# Patient Record
Sex: Female | Born: 1960 | Race: White | Hispanic: No | Marital: Married | Smoking: Former smoker
Health system: Southern US, Community
[De-identification: ages and names within clinical notes are randomized; demographics above are authoritative.]

## PROBLEM LIST (undated history)

## (undated) DIAGNOSIS — R351 Nocturia: Secondary | ICD-10-CM

## (undated) DIAGNOSIS — Z860100 Personal history of colon polyps, unspecified: Secondary | ICD-10-CM

## (undated) DIAGNOSIS — F419 Anxiety disorder, unspecified: Secondary | ICD-10-CM

## (undated) DIAGNOSIS — E785 Hyperlipidemia, unspecified: Secondary | ICD-10-CM

## (undated) DIAGNOSIS — Z973 Presence of spectacles and contact lenses: Secondary | ICD-10-CM

## (undated) DIAGNOSIS — I10 Essential (primary) hypertension: Secondary | ICD-10-CM

## (undated) DIAGNOSIS — R3915 Urgency of urination: Secondary | ICD-10-CM

## (undated) DIAGNOSIS — J302 Other seasonal allergic rhinitis: Secondary | ICD-10-CM

## (undated) DIAGNOSIS — E039 Hypothyroidism, unspecified: Secondary | ICD-10-CM

## (undated) DIAGNOSIS — N819 Female genital prolapse, unspecified: Secondary | ICD-10-CM

## (undated) DIAGNOSIS — Z862 Personal history of diseases of the blood and blood-forming organs and certain disorders involving the immune mechanism: Secondary | ICD-10-CM

## (undated) DIAGNOSIS — G629 Polyneuropathy, unspecified: Secondary | ICD-10-CM

## (undated) DIAGNOSIS — E119 Type 2 diabetes mellitus without complications: Secondary | ICD-10-CM

## (undated) HISTORY — DX: Hyperlipidemia, unspecified: E78.5

## (undated) HISTORY — DX: Polyneuropathy, unspecified: G62.9

## (undated) HISTORY — DX: Anxiety disorder, unspecified: F41.9

## (undated) HISTORY — DX: Type 2 diabetes mellitus without complications: E11.9

## (undated) HISTORY — DX: Essential (primary) hypertension: I10

---

## 1999-08-19 HISTORY — PX: ABDOMINAL HYSTERECTOMY: SHX81

## 2004-12-18 ENCOUNTER — Ambulatory Visit: Payer: Self-pay | Admitting: General Surgery

## 2004-12-24 ENCOUNTER — Ambulatory Visit: Payer: Self-pay | Admitting: General Surgery

## 2006-08-18 HISTORY — PX: LAPAROSCOPIC CHOLECYSTECTOMY: SUR755

## 2008-03-29 ENCOUNTER — Ambulatory Visit: Payer: Self-pay | Admitting: Internal Medicine

## 2009-01-17 ENCOUNTER — Ambulatory Visit: Payer: Self-pay | Admitting: Specialist

## 2011-03-26 LAB — LIPID PANEL: Triglycerides: 539 mg/dL — AB (ref 40–160)

## 2011-03-31 ENCOUNTER — Encounter: Payer: Self-pay | Admitting: Family Medicine

## 2011-03-31 ENCOUNTER — Ambulatory Visit (INDEPENDENT_AMBULATORY_CARE_PROVIDER_SITE_OTHER): Payer: BC Managed Care – PPO | Admitting: Family Medicine

## 2011-03-31 VITALS — BP 130/84 | HR 92 | Temp 98.3°F | Ht 67.0 in | Wt 275.5 lb

## 2011-03-31 DIAGNOSIS — Z1231 Encounter for screening mammogram for malignant neoplasm of breast: Secondary | ICD-10-CM

## 2011-03-31 DIAGNOSIS — E785 Hyperlipidemia, unspecified: Secondary | ICD-10-CM

## 2011-03-31 DIAGNOSIS — F411 Generalized anxiety disorder: Secondary | ICD-10-CM

## 2011-03-31 DIAGNOSIS — F419 Anxiety disorder, unspecified: Secondary | ICD-10-CM

## 2011-03-31 DIAGNOSIS — E669 Obesity, unspecified: Secondary | ICD-10-CM

## 2011-03-31 DIAGNOSIS — E559 Vitamin D deficiency, unspecified: Secondary | ICD-10-CM | POA: Insufficient documentation

## 2011-03-31 DIAGNOSIS — Z0000 Encounter for general adult medical examination without abnormal findings: Secondary | ICD-10-CM

## 2011-03-31 MED ORDER — FLUOXETINE HCL 10 MG PO TABS: 10.0000 mg | ORAL_TABLET | Freq: Every day | ORAL | Status: DC

## 2011-03-31 MED ORDER — SIMVASTATIN 20 MG PO TABS: 20.0000 mg | ORAL_TABLET | Freq: Every day | ORAL | Status: DC

## 2011-03-31 MED ORDER — GEMFIBROZIL 600 MG PO TABS: 600.0000 mg | ORAL_TABLET | Freq: Every day | ORAL | Status: DC

## 2011-03-31 MED ORDER — ALPRAZOLAM 0.25 MG PO TABS: 0.2500 mg | ORAL_TABLET | Freq: Three times a day (TID) | ORAL | Status: AC | PRN

## 2011-03-31 MED ORDER — SUMATRIPTAN 20 MG/ACT NA SOLN: 1.0000 | NASAL | Status: DC | PRN

## 2011-03-31 MED ORDER — ESTRADIOL 2 MG PO TABS: 2.0000 mg | ORAL_TABLET | Freq: Every day | ORAL | Status: DC

## 2011-03-31 NOTE — Progress Notes (Signed)
Subjective:    Patient ID: [name], female    DOB: 10-20-1960, 50 y.o.   MRN: [id number]  HPI  50 yo here to establish care and for CPX. G3P3, s/p hysterectomy.  Had pap smear done at OBGYN last year. On estrace for hot flashes and insomnia which have greatly improved.  Hyperlipidemia- TG very high. Bring in labs with her today. Ran out of her Gemfibrozil months ago. Was not having myalgias with combo of simvastatin 20 mg and Gemfibrozil.  Lab Results  Component Value Date   HDL 28* 03/26/2011   Lab Results  Component Value Date   TRIG 539* 03/26/2011   Anxiety- under more stress financially since husband lost his job. Raising 3 teenagers as well. At times, when she anxious, she can feel panicky and have difficulty catching her breath and swallowing. Does not have those symptoms at other times. She is tearful, no SI or HI. Never taken any medication for anxiety or depression.  Patient Active Problem List  Diagnoses  . Hyperlipidemia  . Anxiety  . Vitamin D deficiency  . Obesity   Past Medical History  Diagnosis Date  . Hyperlipidemia   . Migraine   . Anxiety   . Vitamin D deficiency    Past Surgical History  Procedure Date  . Abdominal hysterectomy   . Cholecystectomy    History  Substance Use Topics  . Smoking status: Never Smoker   . Smokeless tobacco: Not on file  . Alcohol Use: Not on file   Family History  Problem Relation Age of Onset  . Hyperlipidemia Mother    Allergies  Allergen Reactions  . Codeine    Current outpatient prescriptions:ALPRAZolam (XANAX) 0.25 MG tablet, Take 1 tablet (0.25 mg total) by mouth 3 (three) times daily as needed for anxiety., Disp: 90 tablet, Rfl: 0;  butalbital-acetaminophen-caffeine (FIORICET) 50-325-40 MG per tablet, Take 1 tablet by mouth as needed.  , Disp: , Rfl: ;  estradiol (ESTRACE) 2 MG tablet, Take 1 tablet (2 mg total) by mouth daily., Disp: 30 tablet, Rfl: 6 FLUoxetine (PROZAC) 10 MG tablet, Take 1  tablet (10 mg total) by mouth daily., Disp: 30 tablet, Rfl: 1;  gemfibrozil (LOPID) 600 MG tablet, Take 1 tablet (600 mg total) by mouth daily., Disp: 90 tablet, Rfl: 3;  ibuprofen (ADVIL,MOTRIN) 800 MG tablet, Take 800 mg by mouth as needed.  , Disp: , Rfl: ;  MECLIZINE HCL PO, Take by mouth as needed.  , Disp: , Rfl:  simvastatin (ZOCOR) 20 MG tablet, Take 1 tablet (20 mg total) by mouth at bedtime., Disp: 30 tablet, Rfl: 11;  SUMAtriptan (IMITREX) 20 MG/ACT nasal spray, Place 1 spray (20 mg total) into the nose as needed., Disp: 1 Inhaler, Rfl: 3;  Vitamin D, Ergocalciferol, (DRISDOL) 50000 UNITS CAPS, Take 50,000 Units by mouth once a week.  , Disp: , Rfl:       Review of Systems See HPI Patient reports no  vision/ hearing changes,anorexia, weight change, fever ,adenopathy, persistant / recurrent hoarseness,  chest pain, edema,persistant / recurrent cough, hemoptysis, gastrointestinal  bleeding (melena, rectal bleeding), abdominal pain, excessive heart burn, GU symptoms(dysuria, hematuria, pyuria, voiding/incontinence  Issues) syncope, focal weakness, severe memory loss, concerning skin lesions,  abnormal bruising/bleeding, major joint swelling, breast masses or abnormal vaginal bleeding.       Objective:   Physical Exam BP 130/84  Pulse 92  Temp 98.3 F (36.8 C)  Ht 5\' 7"  (1.702 m)  Wt 275 lb 8 oz (124.966  kg)  BMI 43.15 kg/m2  General:  Pleasant overweight female,in no acute distress; alert,appropriate and cooperative throughout examination Head:  normocephlic and atraumatic.   Eyes:  vision grossly intact, pupils equal, pupils round, and pupils reactive to light.   Ears:  R ear normal and L ear normal.   Nose:  no external deformity.   Mouth:  good dentition.   Neck:  No deformities, masses, or tenderness noted. Lungs:  Normal respiratory effort, chest expands symmetrically. Lungs are clear to auscultation, no crackles or wheezes. Heart:  Normal rate and regular rhythm. S1 and S2  normal without gallop, murmur, click, rub or other extra sounds. Abdomen:  Bowel sounds positive,abdomen soft and non-tender without masses, organomegaly or hernias noted. Msk:  No deformity or scoliosis noted of thoracic or lumbar spine.   Extremities:  No clubbing, cyanosis, edema, or deformity noted with normal full range of motion of all joints.   Neurologic:  alert & oriented X3 and gait normal.   Skin:  Intact without suspicious lesions or rashes Cervical Nodes:  No lymphadenopathy noted Axillary Nodes:  No palpable lymphadenopathy Psych:  Cognition and judgment appear intact. Alert and cooperative with normal attention span and concentration. No apparent delusions, illusions, hallucinations        Assessment & Plan:    Routine general medical examination at a health care facility   Reviewed preventive care protocols, scheduled due services, and updated immunizations Discussed nutrition, exercise, diet, and healthy lifestyle. Mammogram ordered today.   2. Hyperlipidemia  Deteriorated.  Restart gemfibrozil.  Also discussed dietary changes- see pt instructions for details.  3. Anxiety  Deteriorated.  Will start Prozac 10 mg daily with as needed Xanax.  Follow up in 1 month.

## 2011-03-31 NOTE — Patient Instructions (Signed)
Good to meet you. Please get your lab rechecked in 4 weeks and send me a copy.  Triglycerides are too high.  Weight loss (even small amount) can decrease triglycerides.  Decrease added sugars, eliminate trans fats, increase fiber and limit alcohol.  All these changes together can drop triglycerides by almost 50%.   Please stop by to see Shirlee Limerick on your way out to set up your mammogram.

## 2011-04-28 ENCOUNTER — Encounter: Payer: Self-pay | Admitting: Family Medicine

## 2011-04-29 ENCOUNTER — Encounter: Payer: Self-pay | Admitting: *Deleted

## 2011-06-07 ENCOUNTER — Other Ambulatory Visit: Payer: Self-pay | Admitting: Family Medicine

## 2011-08-11 ENCOUNTER — Other Ambulatory Visit: Payer: Self-pay | Admitting: Family Medicine

## 2011-09-15 ENCOUNTER — Other Ambulatory Visit: Payer: Self-pay | Admitting: *Deleted

## 2011-09-15 NOTE — Telephone Encounter (Signed)
Ok to refill one month but needs to be seen for additional refills.

## 2011-09-15 NOTE — Telephone Encounter (Signed)
Received faxed refill request from pharmacy. Patient was to follow-up after last office visit. Is it okay to refill medication?

## 2011-09-16 NOTE — Telephone Encounter (Signed)
Left message for patient to call back  

## 2011-09-18 MED ORDER — ESTRADIOL 2 MG PO TABS: 2.0000 mg | ORAL_TABLET | Freq: Every day | ORAL | Status: DC

## 2012-04-09 ENCOUNTER — Telehealth: Payer: Self-pay

## 2012-04-09 DIAGNOSIS — N8110 Cystocele, unspecified: Secondary | ICD-10-CM

## 2012-04-09 NOTE — Telephone Encounter (Signed)
Pt's bladder dropped 2 years ago;pt request referral to [facility] to have bladder retacked. Recently noticed blood in urine and sometimes blood on tissue when wipes. Pt could not get appt until 07/07/12 and office suggested pt get PCP to call for referral to get sooner appt. Please advise.

## 2012-04-10 NOTE — Telephone Encounter (Signed)
Referral placed.

## 2012-05-31 ENCOUNTER — Other Ambulatory Visit: Payer: Self-pay | Admitting: Family Medicine

## 2012-09-23 ENCOUNTER — Other Ambulatory Visit: Payer: Self-pay | Admitting: Family Medicine

## 2012-11-11 ENCOUNTER — Ambulatory Visit (INDEPENDENT_AMBULATORY_CARE_PROVIDER_SITE_OTHER): Payer: PRIVATE HEALTH INSURANCE | Admitting: Family Medicine

## 2012-11-11 ENCOUNTER — Encounter: Payer: Self-pay | Admitting: Family Medicine

## 2012-11-11 VITALS — BP 140/82 | HR 80 | Temp 98.1°F | Ht 67.0 in | Wt 273.0 lb

## 2012-11-11 DIAGNOSIS — Z0000 Encounter for general adult medical examination without abnormal findings: Secondary | ICD-10-CM

## 2012-11-11 DIAGNOSIS — R7309 Other abnormal glucose: Secondary | ICD-10-CM | POA: Insufficient documentation

## 2012-11-11 DIAGNOSIS — F411 Generalized anxiety disorder: Secondary | ICD-10-CM

## 2012-11-11 DIAGNOSIS — R7989 Other specified abnormal findings of blood chemistry: Secondary | ICD-10-CM

## 2012-11-11 DIAGNOSIS — N8110 Cystocele, unspecified: Secondary | ICD-10-CM

## 2012-11-11 DIAGNOSIS — E785 Hyperlipidemia, unspecified: Secondary | ICD-10-CM

## 2012-11-11 DIAGNOSIS — F419 Anxiety disorder, unspecified: Secondary | ICD-10-CM

## 2012-11-11 DIAGNOSIS — N8111 Cystocele, midline: Secondary | ICD-10-CM

## 2012-11-11 DIAGNOSIS — Z1231 Encounter for screening mammogram for malignant neoplasm of breast: Secondary | ICD-10-CM

## 2012-11-11 DIAGNOSIS — E669 Obesity, unspecified: Secondary | ICD-10-CM

## 2012-11-11 DIAGNOSIS — E559 Vitamin D deficiency, unspecified: Secondary | ICD-10-CM

## 2012-11-11 MED ORDER — GEMFIBROZIL 600 MG PO TABS: 600.0000 mg | ORAL_TABLET | Freq: Every day | ORAL | Status: DC

## 2012-11-11 MED ORDER — VITAMIN D (ERGOCALCIFEROL) 1.25 MG (50000 UNIT) PO CAPS
50000.0000 [IU] | ORAL_CAPSULE | ORAL | Status: DC
Start: 2012-11-11 — End: 2012-11-11

## 2012-11-11 MED ORDER — SUMATRIPTAN 20 MG/ACT NA SOLN: 1.0000 | NASAL | Status: DC | PRN

## 2012-11-11 MED ORDER — VITAMIN D (ERGOCALCIFEROL) 1.25 MG (50000 UNIT) PO CAPS: 50000.0000 [IU] | ORAL_CAPSULE | ORAL | Status: DC

## 2012-11-11 MED ORDER — BUTALBITAL-APAP-CAFFEINE 50-325-40 MG PO TABS: 1.0000 | ORAL_TABLET | ORAL | Status: DC | PRN

## 2012-11-11 MED ORDER — IBUPROFEN 800 MG PO TABS: 800.0000 mg | ORAL_TABLET | ORAL | Status: DC | PRN

## 2012-11-11 MED ORDER — SIMVASTATIN 20 MG PO TABS: 20.0000 mg | ORAL_TABLET | Freq: Every day | ORAL | Status: DC

## 2012-11-11 NOTE — Progress Notes (Signed)
Subjective:    Patient ID: [name], female    DOB: 24-Jun-1961, 52 y.o.   MRN: [id number]  HPI  52 yo very pleasant female here for CPX.   G3P3, s/p hysterectomy.  Was seeing Dr. Barnabas Lister, [facility] but he retired.    Was on Estrace but stopped taking it a month ago.  Feels her hot flashes are managable without it.    Hyperlipidemia- On Gemfibrozil and simvastatin.    Brings in labs from work from 11/09/2012-  TG much improved at 187, LDL 93, HDL 42.  Lab Results  Component Value Date   HDL 28* 03/26/2011   Lab Results  Component Value Date   TRIG 539* 03/26/2011   Elevated a1c- a1c 6.0.  Denies increased thirst or urination.  Bladder prolapse- saw Dr. McDiarmid (urology) in 03/2012- he recommended she see a GYN to discuss surgical options.  She would like referral.  Vit D deficiency- Vit D 13.3 this month.  She has been a little more fatigued lately. Patient Active Problem List  Diagnosis  . Hyperlipidemia  . Anxiety  . Vitamin D deficiency  . Obesity  . Routine general medical examination at a health care facility   Past Medical History  Diagnosis Date  . Hyperlipidemia   . Migraine   . Anxiety   . Vitamin D deficiency    Past Surgical History  Procedure Laterality Date  . Abdominal hysterectomy    . Cholecystectomy     History  Substance Use Topics  . Smoking status: Never Smoker   . Smokeless tobacco: Not on file  . Alcohol Use: Not on file   Family History  Problem Relation Age of Onset  . Hyperlipidemia Mother    Allergies  Allergen Reactions  . Codeine    Current outpatient prescriptions:butalbital-acetaminophen-caffeine (FIORICET) 50-325-40 MG per tablet, Take 1 tablet by mouth as needed.  , Disp: , Rfl: ;  estradiol (ESTRACE) 2 MG tablet, TAKE 1 TABLET (2 MG TOTAL) BY MOUTH DAILY., Disp: 90 tablet, Rfl: 0;  FLUoxetine (PROZAC) 10 MG tablet, TAKE ONE TABLET BY MOUTH EVERY DAY, Disp: 30 tablet, Rfl: 1 gemfibrozil (LOPID) 600 MG tablet, Take 1 tablet  (600 mg total) by mouth daily., Disp: 90 tablet, Rfl: 3;  ibuprofen (ADVIL,MOTRIN) 800 MG tablet, Take 800 mg by mouth as needed.  , Disp: , Rfl: ;  MECLIZINE HCL PO, Take by mouth as needed.  , Disp: , Rfl: ;  simvastatin (ZOCOR) 20 MG tablet, Take 1 tablet (20 mg total) by mouth at bedtime., Disp: 30 tablet, Rfl: 11 SUMAtriptan (IMITREX) 20 MG/ACT nasal spray, Place 1 spray (20 mg total) into the nose as needed., Disp: 1 Inhaler, Rfl: 3;  Vitamin D, Ergocalciferol, (DRISDOL) 50000 UNITS CAPS, Take 50,000 Units by mouth once a week.  , Disp: , Rfl:       Review of Systems See HPI Patient reports no  vision/ hearing changes,anorexia, weight change, fever ,adenopathy, persistant / recurrent hoarseness,  chest pain, edema,persistant / recurrent cough, hemoptysis, gastrointestinal  bleeding (melena, rectal bleeding), abdominal pain, excessive heart burn, GU symptoms(dysuria, hematuria, pyuria, voiding/incontinence  Issues) syncope, focal weakness, severe memory loss, concerning skin lesions,  abnormal bruising/bleeding, major joint swelling, breast masses or abnormal vaginal bleeding.       Objective:   Physical Exam BP 140/82  Pulse 80  Temp(Src) 98.1 F (36.7 C)  Ht 5\' 7"  (1.702 m)  Wt 273 lb (123.832 kg)  BMI 42.75 kg/m2  General:  Pleasant overweight female,in  no acute distress; alert,appropriate and cooperative throughout examination Head:  normocephlic and atraumatic.   Eyes:  vision grossly intact, pupils equal, pupils round, and pupils reactive to light.   Ears:  R ear normal and L ear normal.   Nose:  no external deformity.   Mouth:  good dentition.   Neck:  No deformities, masses, or tenderness noted. Lungs:  Normal respiratory effort, chest expands symmetrically. Lungs are clear to auscultation, no crackles or wheezes. Heart:  Normal rate and regular rhythm. S1 and S2 normal without gallop, murmur, click, rub or other extra sounds. Abdomen:  Bowel sounds positive,abdomen soft  and non-tender without masses, organomegaly or hernias noted. Msk:  No deformity or scoliosis noted of thoracic or lumbar spine.   Extremities:  No clubbing, cyanosis, edema, or deformity noted with normal full range of motion of all joints.   Neurologic:  alert & oriented X3 and gait normal.   Skin:  Intact without suspicious lesions or rashes Cervical Nodes:  No lymphadenopathy noted Axillary Nodes:  No palpable lymphadenopathy Psych:  Cognition and judgment appear intact. Alert and cooperative with normal attention span and concentration. No apparent delusions, illusions, hallucinations     Assessment & Plan:   1. Routine general medical examination at a health care facility Reviewed preventive care protocols, scheduled due services, and updated immunizations Discussed nutrition, exercise, diet, and healthy lifestyle.   2. Vitamin D deficiency Deteriorated.   Vit D3 50,000 units x 6 weeks , then continue VitD 1600 IU daily.  Recheck in 8-12 weeks.   3. Obesity Wt Readings from Last 3 Encounters:  11/11/12 273 lb (123.832 kg)  03/31/11 275 lb 8 oz (124.966 kg)   Discussed low sugar diet and exercise.  She is motivated to lower a1c and lose weight.  4. Hyperlipidemia Improved. Continue current meds.  5. Bladder prolapse, female, acquired  - Ambulatory referral to Gynecology  6. Other screening mammogram  - MM Digital Screening; Future  7. Elevated hemoglobin A1c measurement See above.  Recheck a1c.

## 2012-11-11 NOTE — Addendum Note (Signed)
Addended by: Eliezer Bottom on: 11/11/2012 09:16 AM   Modules accepted: Orders

## 2012-11-11 NOTE — Patient Instructions (Addendum)
Good to see you.  Please stop by to see Shirlee Limerick on your way out to set up your gynecology referral.   We are restarting your weekly vit D. Vit D3 50,000 units x 8 weeks, then continue VitD 1600 IU daily.    Please have your labs rechecked in 3 months.  Please call to schedule your mammogram.

## 2012-11-22 ENCOUNTER — Encounter: Payer: Self-pay | Admitting: Family Medicine

## 2013-02-04 ENCOUNTER — Telehealth: Payer: Self-pay

## 2013-02-04 NOTE — Telephone Encounter (Signed)
Pt left v/m requesting fluid pills for feet swelling and hurting [employer]; pt said usually occurs with warm weather.Please advise. Pt will be having labs at work and will have results faxed to Dr Dayton Martes.

## 2013-02-05 MED ORDER — HYDROCHLOROTHIAZIDE 12.5 MG PO CAPS: ORAL_CAPSULE | ORAL | Status: DC

## 2013-02-05 NOTE — Telephone Encounter (Signed)
Rx for HCTZ sent.  I would like for her to have a BMET faxed to [place].

## 2013-02-07 NOTE — Telephone Encounter (Signed)
Advised patient, she will have the results faxed.

## 2013-03-04 ENCOUNTER — Encounter: Payer: Self-pay | Admitting: Family Medicine

## 2013-03-04 ENCOUNTER — Ambulatory Visit (INDEPENDENT_AMBULATORY_CARE_PROVIDER_SITE_OTHER): Payer: PRIVATE HEALTH INSURANCE | Admitting: Family Medicine

## 2013-03-04 ENCOUNTER — Telehealth: Payer: Self-pay

## 2013-03-04 VITALS — BP 118/80 | HR 88 | Temp 98.3°F | Ht 67.0 in | Wt 282.0 lb

## 2013-03-04 DIAGNOSIS — N951 Menopausal and female climacteric states: Secondary | ICD-10-CM

## 2013-03-04 MED ORDER — GABAPENTIN 100 MG PO CAPS: ORAL_CAPSULE | ORAL | Status: DC

## 2013-03-04 NOTE — Progress Notes (Signed)
  Subjective:    Patient ID: [name], female    DOB: 1961-05-31, 52 y.o.   MRN: [id number]  HPI  52 yo very pleasant female here to discuss hot flashes.   G3P3, s/p hysterectomy.     Was on Estrace for years but stopped taking it earlier this year because she thought it was not helping.  Symptoms seemed pretty well controlled until the last month or so.  Now having increased incidence of hot flashes during night and day.  Lab Results  Component Value Date   TSH 1.80 03/26/2011   Does feel like hair is falling out.  Denies any other symptoms of hypo or hyperthyroidism.    Patient Active Problem List   Diagnosis Date Noted  . Menopausal syndrome (hot flashes) 03/04/2013  . Elevated hemoglobin A1c measurement 11/11/2012  . Obesity 03/31/2011  . Routine general medical examination at a health care facility 03/31/2011  . Hyperlipidemia   . Anxiety   . Vitamin D deficiency    Past Medical History  Diagnosis Date  . Hyperlipidemia   . Migraine   . Anxiety   . Vitamin D deficiency    Past Surgical History  Procedure Laterality Date  . Abdominal hysterectomy    . Cholecystectomy     History  Substance Use Topics  . Smoking status: Never Smoker   . Smokeless tobacco: Not on file  . Alcohol Use: Not on file   Family History  Problem Relation Age of Onset  . Hyperlipidemia Mother    Allergies  Allergen Reactions  . Codeine    Current outpatient prescriptions:butalbital-acetaminophen-caffeine (FIORICET) 50-325-40 MG per tablet, Take 1 tablet by mouth as needed., Disp: 30 tablet, Rfl: 0;  gemfibrozil (LOPID) 600 MG tablet, Take 1 tablet (600 mg total) by mouth daily., Disp: 90 tablet, Rfl: 3;  hydrochlorothiazide (MICROZIDE) 12.5 MG capsule, 1 tab by mouth daily as needed for lower extremity edema., Disp: 30 capsule, Rfl: 0 ibuprofen (ADVIL,MOTRIN) 800 MG tablet, Take 1 tablet (800 mg total) by mouth as needed., Disp: 30 tablet, Rfl: 1;  MECLIZINE HCL PO, Take by mouth as  needed.  , Disp: , Rfl: ;  simvastatin (ZOCOR) 20 MG tablet, Take 1 tablet (20 mg total) by mouth at bedtime., Disp: 30 tablet, Rfl: 11;  SUMAtriptan (IMITREX) 20 MG/ACT nasal spray, Place 1 spray (20 mg total) into the nose as needed., Disp: 24 Inhaler, Rfl: 0 Vitamin D, Ergocalciferol, (DRISDOL) 50000 UNITS CAPS, Take 1 capsule (50,000 Units total) by mouth every 7 (seven) days., Disp: 30 capsule, Rfl: 0      Review of Systems See HPI     Objective:   Physical Exam BP 118/80  Pulse 88  Temp(Src) 98.3 F (36.8 C)  Wt 282 lb (127.914 kg)  BMI 44.16 kg/m2  General:  Pleasant overweight female,in no acute distress; alert,appropriate and cooperative throughout examination Head:  normocephlic and atraumatic.   Cervical Nodes:  No lymphadenopathy noted Axillary Nodes:  No palpable lymphadenopathy Psych:  Cognition and judgment appear intact. Alert and cooperative with normal attention span and concentration. No apparent delusions, illusions, hallucinations     Assessment & Plan:  1. Menopausal syndrome (hot flashes) Discussed non hormonal tx options, including SSRIs, Gabapentin. She would like to try Gabapentin.  Will start 100 mg qhs, may increase to 300 mg qhs after 3 days. Check TSH/FT4 today. Follow up in a few weeks.

## 2013-03-04 NOTE — Patient Instructions (Addendum)
Great to see you. Let's start Gabapentin (neurontin)- 1 capsule at bedtime, may increase to 3 capsules at bedtime after 3 days.  Call me in a couple of weeks.

## 2013-03-04 NOTE — Telephone Encounter (Signed)
Complicated issue.  Would be best to discuss in office visit.

## 2013-03-04 NOTE — Telephone Encounter (Signed)
Pt left v/m; Hormone med d/c first of year because was not helping; now pt is having severe night sweats and not sleeping well at night and also a lot of sweating during the day. Pt request med for day and night sweats sent to [facility].Please advise.

## 2013-03-04 NOTE — Telephone Encounter (Signed)
Advised patient, appt scheduled for today.

## 2013-03-05 LAB — TSH: TSH: 4.892 u[IU]/mL — ABNORMAL HIGH (ref 0.350–4.500)

## 2013-03-05 LAB — T4, FREE: Free T4: 0.86 ng/dL (ref 0.80–1.80)

## 2013-03-09 ENCOUNTER — Telehealth: Payer: Self-pay | Admitting: *Deleted

## 2013-03-09 ENCOUNTER — Other Ambulatory Visit: Payer: Self-pay | Admitting: Family Medicine

## 2013-03-09 NOTE — Telephone Encounter (Signed)
Pt is to have a tsh and FT4 in 4 to 8 weeks and she is asking if she can have a written order to take to her work to have this drawn.  She wants to pick up today.

## 2013-03-09 NOTE — Telephone Encounter (Signed)
Order placed at front desk, along with copy of labs.

## 2013-03-09 NOTE — Telephone Encounter (Signed)
Rx written and on my desk. 

## 2013-04-29 LAB — LIPID PANEL
HDL: 44 mg/dL (ref 35–70)
LDL Cholesterol: 71 mg/dL

## 2013-05-03 ENCOUNTER — Encounter: Payer: Self-pay | Admitting: Family Medicine

## 2013-05-04 ENCOUNTER — Telehealth: Payer: Self-pay | Admitting: Family Medicine

## 2013-05-04 NOTE — Telephone Encounter (Signed)
Left message asking patient to call back

## 2013-05-04 NOTE — Telephone Encounter (Signed)
Received labs drawn at work for pt.  Please have her schedule an appointment with me to discuss.

## 2013-05-04 NOTE — Telephone Encounter (Signed)
Advised patient, lab appt scheduled. 

## 2013-05-09 ENCOUNTER — Encounter: Payer: Self-pay | Admitting: Family Medicine

## 2013-05-09 ENCOUNTER — Ambulatory Visit (INDEPENDENT_AMBULATORY_CARE_PROVIDER_SITE_OTHER): Payer: PRIVATE HEALTH INSURANCE | Admitting: Family Medicine

## 2013-05-09 VITALS — BP 118/72 | HR 92 | Temp 98.2°F | Ht 67.0 in | Wt 278.0 lb

## 2013-05-09 DIAGNOSIS — E785 Hyperlipidemia, unspecified: Secondary | ICD-10-CM

## 2013-05-09 DIAGNOSIS — R946 Abnormal results of thyroid function studies: Secondary | ICD-10-CM

## 2013-05-09 DIAGNOSIS — R7309 Other abnormal glucose: Secondary | ICD-10-CM

## 2013-05-09 DIAGNOSIS — R7989 Other specified abnormal findings of blood chemistry: Secondary | ICD-10-CM | POA: Insufficient documentation

## 2013-05-09 MED ORDER — LEVOTHYROXINE SODIUM 25 MCG PO TABS: 25.0000 ug | ORAL_TABLET | Freq: Every day | ORAL | Status: DC

## 2013-05-09 NOTE — Progress Notes (Signed)
Subjective:    Patient ID: [name], female    DOB: May 07, 1961, 52 y.o.   MRN: [id number]  HPI  Very pleasant 52 yo female here to discuss labs that were done at work.  She had copy faxed to [place].  1.  Elevated TSH- TSH 6.170, FT4 4.9.   She has been working on losing weight and feels like no matter what she does, she cannot lose.  Very fatigued, also noticed more constipation and cold intolerance.   2.  Elevated eosinophils-  WBC 10.5 (normal as was the rest of CBC other than eosinophils). EOS- 20 %( normal 0-5). EOS absolute 2.1 (normal 0-.4). Denies any known allergies.  No recent infections or night sweats.  Eosinophils were normal in 10/2012.  3.  Elevated a1c- a1c 5.9, which is improved from previous a1c of 6.0.    4.  HLD- on Zocor and Lopid. Cholesterol much improved! Lab Results  Component Value Date   HDL 44 04/29/2013   LDLCALC 71 04/29/2013   TRIG 141 04/29/2013     Patient Active Problem List   Diagnosis Date Noted  . Elevated TSH 05/09/2013  . Eosinophil count raised 05/09/2013  . Menopausal syndrome (hot flashes) 03/04/2013  . Elevated hemoglobin A1c measurement 11/11/2012  . Obesity 03/31/2011  . Routine general medical examination at a health care facility 03/31/2011  . Hyperlipidemia   . Anxiety   . Vitamin D deficiency    Past Medical History  Diagnosis Date  . Hyperlipidemia   . Migraine   . Anxiety   . Vitamin D deficiency    Past Surgical History  Procedure Laterality Date  . Abdominal hysterectomy    . Cholecystectomy     History  Substance Use Topics  . Smoking status: Never Smoker   . Smokeless tobacco: Not on file  . Alcohol Use: Not on file   Family History  Problem Relation Age of Onset  . Hyperlipidemia Mother    Allergies  Allergen Reactions  . Codeine    Current Outpatient Prescriptions on File Prior to Visit  Medication Sig Dispense Refill  . butalbital-acetaminophen-caffeine (FIORICET) 50-325-40 MG per tablet  Take 1 tablet by mouth as needed.  30 tablet  0  . gabapentin (NEURONTIN) 100 MG capsule 1 capsule at bedtime x 3 days, then increase to 3 capsules at bedtime.  90 capsule  3  . gemfibrozil (LOPID) 600 MG tablet Take 1 tablet (600 mg total) by mouth daily.  90 tablet  3  . hydrochlorothiazide (MICROZIDE) 12.5 MG capsule TAKE 1 CAPSULE BY MOUTH DAILY AS NEEDED FOR LOWER EXTREMITY EDEMA.  30 capsule  5  . ibuprofen (ADVIL,MOTRIN) 800 MG tablet Take 1 tablet (800 mg total) by mouth as needed.  30 tablet  1  . MECLIZINE HCL PO Take by mouth as needed.        . simvastatin (ZOCOR) 20 MG tablet Take 1 tablet (20 mg total) by mouth at bedtime.  30 tablet  11  . SUMAtriptan (IMITREX) 20 MG/ACT nasal spray Place 1 spray (20 mg total) into the nose as needed.  24 Inhaler  0  . Vitamin D, Ergocalciferol, (DRISDOL) 50000 UNITS CAPS Take 1 capsule (50,000 Units total) by mouth every 7 (seven) days.  30 capsule  0   No current facility-administered medications on file prior to visit.   The PMH, PSH, Social History, Family History, Medications, and allergies have been reviewed in [facility], and have been updated if relevant.   Review of  Systems See HPI    Objective:   Physical Exam BP 118/72  Pulse 92  Temp(Src) 98.2 F (36.8 C) (Oral)  Ht 5\' 7"  (1.702 m)  Wt 278 lb (126.1 kg)  BMI 43.53 kg/m2 Wt Readings from Last 3 Encounters:  05/09/13 278 lb (126.1 kg)  03/04/13 282 lb (127.914 kg)  11/11/12 273 lb (123.832 kg)   Gen:  Alert, pleasant, obese, NAD Psych: good eye contact, not anxious or depressed appearing       Assessment & Plan:  1. Elevated hemoglobin A1c measurement Improved.  She is working on losing weight.  2. Elevated TSH Will treat since she is symptomatic with Synthroid 25 mcg daily. Given rx to take to work to have TSH and FT4 rechecked in 8 weeks. The patient indicates understanding of these issues and agrees with the plan.   3. Eosinophilia Etiology unclear.  Will refer to  hematology for further work up. - Ambulatory referral to Hematology  4. Hyperlipidemia Improved.  No change to rx.

## 2013-05-09 NOTE — Patient Instructions (Addendum)
Good to see you. We are starting Synthroid 25 mcg daily.  Please get your thyroid function rechecked in 8 weeks. We will call you with hematology referral.

## 2013-05-10 ENCOUNTER — Encounter: Payer: Self-pay | Admitting: Family Medicine

## 2013-05-19 ENCOUNTER — Telehealth: Payer: Self-pay | Admitting: Hematology and Oncology

## 2013-05-19 NOTE — Telephone Encounter (Signed)
C/D 05/19/13 for appt. 06/06/13

## 2013-05-19 NOTE — Telephone Encounter (Signed)
S/w pt and gve np appt 10/20 @ 10:30 w/Dr/Gorsuch.  Referring Dr. Dayton Martes Dx- Eosinophilia Welcome packet mailed.

## 2013-06-06 ENCOUNTER — Ambulatory Visit: Payer: PRIVATE HEALTH INSURANCE

## 2013-06-06 ENCOUNTER — Encounter: Payer: Self-pay | Admitting: Hematology and Oncology

## 2013-06-06 ENCOUNTER — Telehealth: Payer: Self-pay | Admitting: Hematology and Oncology

## 2013-06-06 ENCOUNTER — Ambulatory Visit (HOSPITAL_BASED_OUTPATIENT_CLINIC_OR_DEPARTMENT_OTHER): Payer: PRIVATE HEALTH INSURANCE | Admitting: Hematology and Oncology

## 2013-06-06 VITALS — BP 137/82 | HR 81 | Temp 97.1°F | Resp 20 | Ht 67.0 in | Wt 279.3 lb

## 2013-06-06 DIAGNOSIS — D721 Eosinophilia, unspecified: Secondary | ICD-10-CM | POA: Insufficient documentation

## 2013-06-06 DIAGNOSIS — E559 Vitamin D deficiency, unspecified: Secondary | ICD-10-CM

## 2013-06-06 DIAGNOSIS — J329 Chronic sinusitis, unspecified: Secondary | ICD-10-CM

## 2013-06-06 MED ORDER — PREDNISONE 20 MG PO TABS: 20.0000 mg | ORAL_TABLET | Freq: Every day | ORAL | Status: DC

## 2013-06-06 NOTE — Progress Notes (Signed)
Checked in new pt with no financial concerns. °

## 2013-06-06 NOTE — Progress Notes (Signed)
[name] CONSULT NOTE  Patient Care Team: Dianne Dun, MD as PCP - General (Family Medicine)  CHIEF COMPLAINTS/PURPOSE OF CONSULTATION:  Eosinophilia  HISTORY OF PRESENTING ILLNESS:  [name] 52 y.o. female is here because of abnormal CBC. According to the patient, she of her routine blood work done every 6 months. On review of her more recent CBC from September 2014 reviewed that she have a normal white count 10.5 however with 20% eosinophil. I had the opportunity to review her prior CBC and eosinophil counts. The one from March 2014 was within normal limits. Setting around summer of this year, she started to have complaints of persistent pressure sensation in his sinuses and sinus drainage. She has been placed on multiple courses of oral antibiotics including azithromycin, amoxicillin, and Augmentin. She has intermittent cough periodically. The antibiotic would only help for short period of time. She denies any new medications apart from the antibiotics. She denies any diarrhea or abdominal cramps. She denies any abnormal skin rashes. The patient has a dog but she had it for 5 years. She denies any foreign travel recently.  MEDICAL HISTORY:  Past Medical History  Diagnosis Date  . Hyperlipidemia   . Migraine   . Anxiety   . Vitamin D deficiency   . Sinusitis 06/06/2013  . 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 Abdominal hysterectomy    . Cholecystectomy      SOCIAL HISTORY: History   Social History  . Marital Status: Married    Spouse Name: N/A    Number of Children: 3  . Years of Education: N/A   Occupational History  .     Social History Main Topics  . Smoking status: Never Smoker   . Smokeless tobacco: Never Used  . Alcohol Use: Yes     Comment: rarely  . Drug Use: No  . Sexual Activity: Not on file   Other Topics Concern  . Not on file   Social History Narrative  . No  narrative on file    FAMILY HISTORY: Family History  Problem Relation Age of Onset  . Hyperlipidemia Mother     ALLERGIES:  is allergic to codeine.  MEDICATIONS:  Current Outpatient Prescriptions  Medication Sig Dispense Refill  . butalbital-acetaminophen-caffeine (FIORICET) 50-325-40 MG per tablet Take 1 tablet by mouth as needed.  30 tablet  0  . gabapentin (NEURONTIN) 100 MG capsule Take  3 capsules by mouth at bedtime.      Marland Kitchen gemfibrozil (LOPID) 600 MG tablet Take 1 tablet (600 mg total) by mouth daily.  90 tablet  3  . hydrochlorothiazide (MICROZIDE) 12.5 MG capsule TAKE 1 CAPSULE BY MOUTH DAILY AS NEEDED FOR LOWER EXTREMITY EDEMA.  30 capsule  5  . ibuprofen (ADVIL,MOTRIN) 800 MG tablet Take 1 tablet (800 mg total) by mouth as needed.  30 tablet  1  . levothyroxine (SYNTHROID, LEVOTHROID) 25 MCG tablet Take 1 tablet (25 mcg total) by mouth daily before breakfast.  30 tablet  3  . MECLIZINE HCL PO Take by mouth as needed.        . simvastatin (ZOCOR) 20 MG tablet Take 1 tablet (20 mg total) by mouth at bedtime.  30 tablet  11  . SUMAtriptan (IMITREX) 20 MG/ACT nasal spray Place 1 spray (20 mg total) into the nose as needed.  24 Inhaler  0  . Vitamin D, Ergocalciferol, (DRISDOL) 50000 UNITS CAPS Take 1 capsule (  50,000 Units total) by mouth every 7 (seven) days.  30 capsule  0  . predniSONE (DELTASONE) 20 MG tablet Take 1 tablet (20 mg total) by mouth daily.  14 tablet  0   No current facility-administered medications for this visit.    REVIEW OF SYSTEMS:   Constitutional: Denies fevers, chills or abnormal night sweats Eyes: Denies blurriness of vision, double vision or watery eyes Ears, nose, mouth, throat, and face: Denies mucositis or sore throat Cardiovascular: Denies palpitation, chest discomfort or lower extremity swelling Gastrointestinal:  Denies nausea, heartburn or change in bowel habits Skin: Denies abnormal skin rashes Lymphatics: Denies new lymphadenopathy or easy  bruising Neurological:Denies numbness, tingling or new weaknesses Behavioral/Psych: Mood is stable, no new changes  All other systems were reviewed with the patient and are negative.  PHYSICAL EXAMINATION: ECOG PERFORMANCE STATUS: 0 - Asymptomatic  Filed Vitals:   06/06/13 1037  BP: 137/82  Pulse: 81  Temp: 97.1 F (36.2 C)  Resp: 20   Filed Weights   06/06/13 1037  Weight: 279 lb 4.8 oz (126.69 kg)    GENERAL:alert, no distress and comfortable. Patient is mildly obese SKIN: skin color, texture, turgor are normal, no rashes or significant lesions EYES: normal, conjunctiva are pink and non-injected, sclera clear OROPHARYNX:no exudate, no erythema and lips, buccal mucosa, and tongue normal . No tenderness on palpation over her sinuses NECK: supple, thyroid normal size, non-tender, without nodularity LYMPH:  no palpable lymphadenopathy in the cervical, axillary or inguinal LUNGS: clear to auscultation and percussion with normal breathing effort HEART: regular rate & rhythm and no murmurs and no lower extremity edema ABDOMEN:abdomen soft, non-tender and normal bowel sounds Musculoskeletal:no cyanosis of digits and no clubbing  PSYCH: alert & oriented x 3 with fluent speech NEURO: no focal motor/sensory deficits  LABORATORY DATA:  I have reviewed the data as listed Recent Results (from the past 2160 hour(s))  CBC AND DIFFERENTIAL     Status: None   Collection Time    04/29/13 12:00 AM      Result Value Range   WBC 10.5    LIPID PANEL     Status: None   Collection Time    04/29/13 12:00 AM      Result Value Range   Triglycerides 141  40 - 160 mg/dL   HDL 44  35 - 70 mg/dL   LDL Cholesterol 71    TSH     Status: Abnormal   Collection Time    04/29/13 12:00 AM      Result Value Range   TSH 6.17 (*) 0.41 - 5.90 uIU/mL   ASSESSMENT:  #1 mild eosinophilia, 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 Sometimes antibiotic can also cause mild eosinophilia. The patient is to have any signs or symptoms to suggest hypereosinophilic syndrome. I recommend a trial of corticosteroids to help control her sinus symptoms and to take over-the-counter allergy medications. I will see her back in a month with repeat CBC with differential and sedimentation rate. The patient is in agreement with conservative management and repeat CBC in the future before we order an additional workup for hypereosinophilic syndrome. Orders Placed This Encounter  Procedures  . CBC with Differential    Standing Status: Future     Number of Occurrences:      Standing Expiration Date: 02/26/2014  . Draw extra clot tube    Standing Status: Future  Number of Occurrences:      Standing Expiration Date: 06/06/2014  . Sedimentation rate    Standing Status: Future     Number of Occurrences:      Standing Expiration Date: 06/06/2014    All questions were answered. The patient knows to call the clinic with any problems, questions or concerns. I spent 25 minutes counseling the patient face to face. The total time spent in the appointment was 46 minutes and more than 50% was on counseling.     [name], Natale Thoma, MD 06/06/2013 12:49 PM

## 2013-06-06 NOTE — Telephone Encounter (Signed)
gv and printed appt sched and avs for pt for NOV °

## 2013-06-15 ENCOUNTER — Ambulatory Visit: Payer: Self-pay | Admitting: General Practice

## 2013-06-15 LAB — HM MAMMOGRAPHY: HM Mammogram: NEGATIVE

## 2013-06-23 ENCOUNTER — Other Ambulatory Visit: Payer: Self-pay

## 2013-07-04 ENCOUNTER — Ambulatory Visit (HOSPITAL_BASED_OUTPATIENT_CLINIC_OR_DEPARTMENT_OTHER): Payer: PRIVATE HEALTH INSURANCE | Admitting: Hematology and Oncology

## 2013-07-04 ENCOUNTER — Other Ambulatory Visit (HOSPITAL_BASED_OUTPATIENT_CLINIC_OR_DEPARTMENT_OTHER): Payer: PRIVATE HEALTH INSURANCE | Admitting: Lab

## 2013-07-04 VITALS — BP 129/81 | HR 85 | Temp 97.8°F | Resp 19 | Ht 67.0 in | Wt 281.4 lb

## 2013-07-04 DIAGNOSIS — D649 Anemia, unspecified: Secondary | ICD-10-CM

## 2013-07-04 DIAGNOSIS — J329 Chronic sinusitis, unspecified: Secondary | ICD-10-CM

## 2013-07-04 LAB — CBC WITH DIFFERENTIAL/PLATELET
BASO%: 0.8 % (ref 0.0–2.0)
Basophils Absolute: 0 10*3/uL (ref 0.0–0.1)
Eosinophils Absolute: 0.2 10*3/uL (ref 0.0–0.5)
HCT: 34.1 % — ABNORMAL LOW (ref 34.8–46.6)
HGB: 11.2 g/dL — ABNORMAL LOW (ref 11.6–15.9)
LYMPH%: 38.9 % (ref 14.0–49.7)
MCHC: 32.8 g/dL (ref 31.5–36.0)
MONO#: 0.4 10*3/uL (ref 0.1–0.9)
NEUT#: 2.6 10*3/uL (ref 1.5–6.5)
NEUT%: 47.7 % (ref 38.4–76.8)
Platelets: 250 10*3/uL (ref 145–400)
WBC: 5.5 10*3/uL (ref 3.9–10.3)
lymph#: 2.1 10*3/uL (ref 0.9–3.3)

## 2013-07-04 NOTE — Progress Notes (Signed)
[facility] OFFICE PROGRESS NOTE  Ruthe Mannan, MD DIAGNOSIS:  Recent acute elevated eosinophil count, related to allergies  SUMMARY OF HEMATOLOGIC HISTORY:  This is a pleasant 52 year old lady with recurrent sinus infection. She has been placed on multiple courses of antibiotics. She had a CBC done in September 2014 which show elevated eosinophil count.  INTERVAL HISTORY: [name] 52 y.o. female returns for  further followup. When I saw her last, put her on a short course of prednisone. She stated she started to have pain in the left maxilla sinus and went to see her primary care provider who prescribed amoxicillin. She felt that her sinus congestion its worst, with associated tooth pain She denies any recent fever, chills, night sweats or abnormal weight loss  I have reviewed the past medical history, past surgical history, social history and family history with the patient and they are unchanged from previous note.  ALLERGIES:  is allergic to codeine.  MEDICATIONS:  Current Outpatient Prescriptions  Medication Sig Dispense Refill  . amoxicillin (AMOXIL) 875 MG tablet Take 875 mg by mouth 2 (two) times daily.      . butalbital-acetaminophen-caffeine (FIORICET) 50-325-40 MG per tablet Take 1 tablet by mouth as needed.  30 tablet  0  . gabapentin (NEURONTIN) 100 MG capsule Take  3 capsules by mouth at bedtime.      Marland Kitchen gemfibrozil (LOPID) 600 MG tablet Take 1 tablet (600 mg total) by mouth daily.  90 tablet  3  . hydrochlorothiazide (MICROZIDE) 12.5 MG capsule TAKE 1 CAPSULE BY MOUTH DAILY AS NEEDED FOR LOWER EXTREMITY EDEMA.  30 capsule  5  . ibuprofen (ADVIL,MOTRIN) 800 MG tablet Take 1 tablet (800 mg total) by mouth as needed.  30 tablet  1  . levothyroxine (SYNTHROID, LEVOTHROID) 25 MCG tablet Take 1 tablet (25 mcg total) by mouth daily before breakfast.  30 tablet  3  . MECLIZINE HCL PO Take by mouth as needed.        . simvastatin (ZOCOR) 20 MG tablet Take 1 tablet  (20 mg total) by mouth at bedtime.  30 tablet  11  . SUMAtriptan (IMITREX) 20 MG/ACT nasal spray Place 1 spray (20 mg total) into the nose as needed.  24 Inhaler  0  . Vitamin D, Ergocalciferol, (DRISDOL) 50000 UNITS CAPS Take 1 capsule (50,000 Units total) by mouth every 7 (seven) days.  30 capsule  0   No current facility-administered medications for this visit.     REVIEW OF SYSTEMS:   Constitutional: Denies fevers, chills or night sweats All other systems were reviewed with the patient and are negative.  PHYSICAL EXAMINATION: ECOG PERFORMANCE STATUS: 0 - Asymptomatic  Filed Vitals:   07/04/13 1509  BP: 129/81  Pulse: 85  Temp: 97.8 F (36.6 C)  Resp: 19   Filed Weights   07/04/13 1509  Weight: 281 lb 6.4 oz (127.642 kg)    GENERAL:alert, no distress and comfortable NEURO: alert & oriented x 3 with fluent speech, no focal motor/sensory deficits  LABORATORY DATA:  I have reviewed the data as listed Results for orders placed in visit on 07/04/13 (from the past 48 hour(s))  CBC WITH DIFFERENTIAL     Status: Abnormal   Collection Time    07/04/13  2:16 PM      Result Value Range   WBC 5.5  3.9 - 10.3 10e3/uL   NEUT# 2.6  1.5 - 6.5 10e3/uL   HGB 11.2 (*) 11.6 - 15.9 g/dL  HCT 34.1 (*) 34.8 - 46.6 %   Platelets 250  145 - 400 10e3/uL   MCV 87.2  79.5 - 101.0 fL   MCH 28.6  25.1 - 34.0 pg   MCHC 32.8  31.5 - 36.0 g/dL   RBC 2.95  2.84 - 1.32 10e6/uL   RDW 14.9 (*) 11.2 - 14.5 %   lymph# 2.1  0.9 - 3.3 10e3/uL   MONO# 0.4  0.1 - 0.9 10e3/uL   Eosinophils Absolute 0.2  0.0 - 0.5 10e3/uL   Basophils Absolute 0.0  0.0 - 0.1 10e3/uL   NEUT% 47.7  38.4 - 76.8 %   LYMPH% 38.9  14.0 - 49.7 %   MONO% 8.0  0.0 - 14.0 %   EOS% 4.6  0.0 - 7.0 %   BASO% 0.8  0.0 - 2.0 %    Lab Results  Component Value Date   WBC 5.5 07/04/2013   HGB 11.2* 07/04/2013   HCT 34.1* 07/04/2013   MCV 87.2 07/04/2013   PLT 250 07/04/2013    ASSESSMENT & PLAN:  #1 elevated eosinophil count,  resolved I suspect this is related to allergic rhinitis. She has no signs or symptoms to suggest hypereosinophilic syndrome. He has resolved. No further workup is necessary. #2 mild anemia This is acute in nature. Her hemoglobin from September was normal. I suspect this is infection related. I reassured her. We will just observe. #3 recurrent sinus infection I recommend ENT consult. She has an ENT specialist closer to home. She will make the phone call herself. All questions were answered. The patient knows to call the clinic with any problems, questions or concerns. No barriers to learning was detected.  I spent 15 minutes counseling the patient face to face. The total time spent in the appointment was 20 minutes and more than 50% was on counseling.     [facility], Diane Winsett, MD 07/04/2013 3:17 PM

## 2013-07-10 ENCOUNTER — Other Ambulatory Visit: Payer: Self-pay | Admitting: Family Medicine

## 2013-07-10 NOTE — Telephone Encounter (Signed)
Last office visit 05/09/2013.  Ok to refill?

## 2013-08-13 ENCOUNTER — Other Ambulatory Visit: Payer: Self-pay | Admitting: Family Medicine

## 2013-09-12 ENCOUNTER — Other Ambulatory Visit: Payer: Self-pay | Admitting: Family Medicine

## 2013-12-25 ENCOUNTER — Other Ambulatory Visit: Payer: Self-pay | Admitting: Family Medicine

## 2014-02-03 ENCOUNTER — Other Ambulatory Visit: Payer: Self-pay | Admitting: Family Medicine

## 2014-03-13 ENCOUNTER — Other Ambulatory Visit: Payer: Self-pay | Admitting: Family Medicine

## 2014-03-13 MED ORDER — SUMATRIPTAN 20 MG/ACT NA SOLN: 1.0000 | NASAL | Status: DC | PRN

## 2014-03-13 MED ORDER — BUTALBITAL-APAP-CAFFEINE 50-325-40 MG PO TABS: 1.0000 | ORAL_TABLET | ORAL | Status: DC | PRN

## 2014-03-13 NOTE — Telephone Encounter (Signed)
Pt responded to via mychart 

## 2014-03-13 NOTE — Telephone Encounter (Signed)
Pt requesting medication refill. Last f/u appt 04/2013 with no future appts scheduled. pls advise

## 2014-03-15 LAB — LIPID PANEL
HDL: 42 mg/dL (ref 35–70)
LDL CALC: 80 mg/dL
LDL Cholesterol: 6 mg/dL
Triglycerides: 222 mg/dL — AB (ref 40–160)

## 2014-03-15 LAB — CBC AND DIFFERENTIAL
HCT: 38 % (ref 36–46)
HEMOGLOBIN: 12.2 g/dL (ref 12.0–16.0)
Platelets: 283 10*3/uL (ref 150–399)
WBC: 6.8 10*3/mL

## 2014-03-15 LAB — HEPATIC FUNCTION PANEL
ALT: 27 U/L (ref 7–35)
AST: 22 U/L (ref 13–35)

## 2014-03-15 LAB — BASIC METABOLIC PANEL
Creatinine: 0.7 mg/dL (ref 0.5–1.1)
Glucose: 93 mg/dL

## 2014-03-15 LAB — HEMOGLOBIN A1C: HEMOGLOBIN A1C: 5.8 % (ref 4.0–6.0)

## 2014-03-15 LAB — TSH: TSH: 3 u[IU]/mL (ref 0.41–5.90)

## 2014-03-24 ENCOUNTER — Encounter: Payer: Self-pay | Admitting: Family Medicine

## 2014-03-24 ENCOUNTER — Encounter: Payer: Self-pay | Admitting: Internal Medicine

## 2014-03-24 ENCOUNTER — Ambulatory Visit (INDEPENDENT_AMBULATORY_CARE_PROVIDER_SITE_OTHER): Payer: 59 | Admitting: Family Medicine

## 2014-03-24 VITALS — BP 120/78 | HR 102 | Temp 98.1°F | Ht 67.25 in | Wt 282.5 lb

## 2014-03-24 DIAGNOSIS — D721 Eosinophilia, unspecified: Secondary | ICD-10-CM

## 2014-03-24 DIAGNOSIS — E785 Hyperlipidemia, unspecified: Secondary | ICD-10-CM

## 2014-03-24 DIAGNOSIS — R898 Other abnormal findings in specimens from other organs, systems and tissues: Secondary | ICD-10-CM

## 2014-03-24 DIAGNOSIS — Z1211 Encounter for screening for malignant neoplasm of colon: Secondary | ICD-10-CM

## 2014-03-24 DIAGNOSIS — E559 Vitamin D deficiency, unspecified: Secondary | ICD-10-CM

## 2014-03-24 DIAGNOSIS — G629 Polyneuropathy, unspecified: Secondary | ICD-10-CM | POA: Insufficient documentation

## 2014-03-24 DIAGNOSIS — N8110 Cystocele, unspecified: Secondary | ICD-10-CM | POA: Insufficient documentation

## 2014-03-24 DIAGNOSIS — Z01419 Encounter for gynecological examination (general) (routine) without abnormal findings: Secondary | ICD-10-CM

## 2014-03-24 DIAGNOSIS — G589 Mononeuropathy, unspecified: Secondary | ICD-10-CM

## 2014-03-24 DIAGNOSIS — N8111 Cystocele, midline: Secondary | ICD-10-CM

## 2014-03-24 DIAGNOSIS — Z0000 Encounter for general adult medical examination without abnormal findings: Secondary | ICD-10-CM

## 2014-03-24 MED ORDER — IBUPROFEN 800 MG PO TABS: 800.0000 mg | ORAL_TABLET | ORAL | Status: DC | PRN

## 2014-03-24 MED ORDER — GEMFIBROZIL 600 MG PO TABS: 600.0000 mg | ORAL_TABLET | Freq: Every day | ORAL | Status: DC

## 2014-03-24 MED ORDER — HYDROCHLOROTHIAZIDE 12.5 MG PO CAPS: ORAL_CAPSULE | ORAL | Status: DC

## 2014-03-24 MED ORDER — LEVOTHYROXINE SODIUM 25 MCG PO TABS: ORAL_TABLET | ORAL | Status: DC

## 2014-03-24 MED ORDER — SIMVASTATIN 20 MG PO TABS: 20.0000 mg | ORAL_TABLET | Freq: Every day | ORAL | Status: DC

## 2014-03-24 NOTE — Assessment & Plan Note (Signed)
Eos normal now. Followed by heme

## 2014-03-24 NOTE — Patient Instructions (Signed)
Great to see you. Please stop by to see Rosaria Ferries on your way out to set up your colonoscopy and your GYN appointment.

## 2014-03-24 NOTE — Assessment & Plan Note (Signed)
Remains low but she is repleting this.

## 2014-03-24 NOTE — Assessment & Plan Note (Signed)
Reviewed preventive care protocols, scheduled due services, and updated immunizations Discussed nutrition, exercise, diet, and healthy lifestyle.  Refer to GI for colonoscopy. 

## 2014-03-24 NOTE — Progress Notes (Signed)
Subjective:    Patient ID: [name], female    DOB: 1961/03/18, 53 y.o.   MRN: [id number]  HPI  Very pleasant 53 yo female here for CPX.  Had lab work done at work- brings copy to me.  Remote h/o hysterectomy- she is not sure if she has cervix. Mammogram UTD 05/2013- done at [facility]. Bladder prolapse- feels symptoms are progressing.  More UI- can also "feel her bladder."  No pain.    Has never had a colonoscopy.  Has not changes in bowel habits or blood in stool.  No FH of colon CA.  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 Last saw Dr. Alvy Bimler on 07/04/13- note reviewed. Felt her eosinophila related to sinusitis, recommended ENT referral.  She saw Dr. Anda Latina- advised flonase and OTC antihistamine and she has been feeling better. Eos (Abs) 0.4.  Denies any known allergies.  No recent infections or night sweats.  Vit D deficiency- Vit D 24.5.  She is taking high dose 50,000 IU weekly along with 6000 IU daily.   Neuropathy- h/o tingling in legs at night.  Gabapentin has helped.      HLD- on Zocor and Lopid. Cholesterol much improved! Lab Results  Component Value Date   HDL 42 03/15/2014   LDLCALC 6 03/15/2014   LDLCALC 80 03/15/2014   TRIG 222* 03/15/2014     Patient Active Problem List   Diagnosis Date Noted  . Encounter for routine gynecological examination 03/24/2014  . Sinusitis 06/06/2013  . 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 Elevated TSH 05/09/2013  . Eosinophil count raised 05/09/2013  . Menopausal syndrome (hot flashes) 03/04/2013  . Elevated hemoglobin A1c measurement 11/11/2012  . Obesity 03/31/2011  . Routine general medical examination at a health care facility 03/31/2011  . Hyperlipidemia   . Anxiety   . Vitamin D deficiency    Past Medical History  Diagnosis Date  . Hyperlipidemia   . Migraine   . Anxiety   . Vitamin D deficiency   . Sinusitis 06/06/2013  . 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  Abdominal hysterectomy    . Cholecystectomy     History  Substance Use Topics  . Smoking status: Never Smoker   . Smokeless tobacco: Never Used  . Alcohol Use: Yes     Comment: rarely   Family History  Problem Relation Age of Onset  . Hyperlipidemia Mother    Allergies  Allergen Reactions  . Codeine    Current Outpatient Prescriptions on File Prior to Visit  Medication Sig Dispense Refill  . butalbital-acetaminophen-caffeine (FIORICET) 50-325-40 MG per tablet Take 1 tablet by mouth as needed.  30 tablet  0  . gemfibrozil (LOPID) 600 MG tablet Take 1 tablet (600 mg total) by mouth daily.  90 tablet  3  . hydrochlorothiazide (MICROZIDE) 12.5 MG capsule TAKE 1 CAPSULE BY MOUTH DAILY AS NEEDED FOR LOWER EXTREMITY EDEMA.  30 capsule  5  . ibuprofen (ADVIL,MOTRIN) 800 MG tablet Take 1 tablet (800 mg total) by mouth as needed.  30 tablet  1  . levothyroxine (SYNTHROID, LEVOTHROID) 25 MCG tablet Take one tablet daily before breakfast. **NEEDS FOLLOW UP WITH LABS PRIOR TO FURTHER REFILLS**  30 tablet  0  . MECLIZINE HCL PO Take by mouth as needed.        . simvastatin (ZOCOR) 20 MG tablet Take 1 tablet (20 mg total) by mouth at bedtime.  30 tablet  11  . SUMAtriptan (IMITREX) 20 MG/ACT nasal  spray Place 1 spray (20 mg total) into the nose as needed.  24 Inhaler  0  . Vitamin D, Ergocalciferol, (DRISDOL) 50000 UNITS CAPS capsule TAKE 1 CAPSULE  BY MOUTH EVERY 7 DAYS.  4 capsule  11   No current facility-administered medications on file prior to visit.   The PMH, PSH, Social History, Family History, Medications, and allergies have been reviewed in [facility], and have been updated if relevant.   Review of Systems  Constitutional: Negative.   HENT: Negative.   Eyes: Negative.   Respiratory: Negative.  Negative for apnea, cough, chest tightness and shortness of breath.   Cardiovascular: Negative for chest pain, palpitations and leg swelling.  Gastrointestinal: Negative for abdominal pain,  diarrhea, constipation, blood in stool and abdominal distention.  Endocrine: Negative for cold intolerance and heat intolerance.  Genitourinary: Negative for dysuria, flank pain and difficulty urinating.  Musculoskeletal: Negative.   Neurological: Negative.   Hematological: Negative.   Psychiatric/Behavioral: Negative.   All other systems reviewed and are negative.  See HPI + urinary incontinence- gradually progressing    Objective:   Physical Exam BP 120/78  Pulse 102  Temp(Src) 98.1 F (36.7 C) (Oral)  Ht 5' 7.25" (1.708 m)  Wt 282 lb 8 oz (128.141 kg)  BMI 43.93 kg/m2  SpO2 97% Wt Readings from Last 3 Encounters:  03/24/14 282 lb 8 oz (128.141 kg)  07/04/13 281 lb 6.4 oz (127.642 kg)  06/06/13 279 lb 4.8 oz (126.69 kg)    General:  Well-developed,well-nourished,in no acute distress; alert,appropriate and cooperative throughout examination Head:  normocephalic and atraumatic.   Eyes:  vision grossly intact, pupils equal, pupils round, and pupils reactive to light.   Ears:  R ear normal and L ear normal.   Nose:  no external deformity.   Mouth:  good dentition.   Neck:  No deformities, masses, or tenderness noted. Breasts:  No mass, nodules, thickening, tenderness, bulging, retraction, inflamation, nipple discharge or skin changes noted.   Lungs:  Normal respiratory effort, chest expands symmetrically. Lungs are clear to auscultation, no crackles or wheezes. Heart:  Normal rate and regular rhythm. S1 and S2 normal without gallop, murmur, click, rub or other extra sounds. Abdomen:  Bowel sounds positive,abdomen soft and non-tender without masses, organomegaly or hernias noted. Msk:  No deformity or scoliosis noted of thoracic or lumbar spine.   Extremities:  No clubbing, cyanosis, edema, or deformity noted with normal full range of motion of all joints.   Neurologic:  alert & oriented X3 and gait normal.   Skin:  Intact without suspicious lesions or rashes Cervical Nodes:   No lymphadenopathy noted Axillary Nodes:  No palpable lymphadenopathy Psych:  Cognition and judgment appear intact. Alert and cooperative with normal attention span and concentration. No apparent delusions, illusions, hallucinations        Assessment & Plan:

## 2014-03-24 NOTE — Assessment & Plan Note (Signed)
Deteriorated. Refer to GYN for tx options- surgical intervention.

## 2014-03-24 NOTE — Assessment & Plan Note (Signed)
Stable on zocor 20 mg daily. No changes.

## 2014-03-24 NOTE — Progress Notes (Signed)
Pre visit review using our clinic review tool, if applicable. No additional management support is needed unless otherwise documented below in the visit note. 

## 2014-04-10 ENCOUNTER — Ambulatory Visit (AMBULATORY_SURGERY_CENTER): Payer: 59 | Admitting: *Deleted

## 2014-04-10 VITALS — Ht 67.0 in | Wt 283.8 lb

## 2014-04-10 DIAGNOSIS — Z1211 Encounter for screening for malignant neoplasm of colon: Secondary | ICD-10-CM

## 2014-04-10 MED ORDER — MOVIPREP 100 G PO SOLR: ORAL | Status: DC

## 2014-04-10 NOTE — Progress Notes (Signed)
Patient denies any allergies to eggs or soy. Patient has post-op nausea with anesthesia. Patient denies any oxygen use at home and does not take any diet/weight loss prescription medications. Does take supplements. EMMI education assisgned to patient on colonoscopy, this was explained and instructions given to patient.

## 2014-04-18 ENCOUNTER — Encounter: Payer: Self-pay | Admitting: Internal Medicine

## 2014-04-25 ENCOUNTER — Encounter: Payer: Self-pay | Admitting: Internal Medicine

## 2014-04-25 ENCOUNTER — Ambulatory Visit (AMBULATORY_SURGERY_CENTER): Payer: 59 | Admitting: Internal Medicine

## 2014-04-25 VITALS — BP 137/77 | HR 80 | Temp 97.7°F | Resp 18 | Ht 67.0 in | Wt 283.0 lb

## 2014-04-25 DIAGNOSIS — D126 Benign neoplasm of colon, unspecified: Secondary | ICD-10-CM

## 2014-04-25 DIAGNOSIS — Z1211 Encounter for screening for malignant neoplasm of colon: Secondary | ICD-10-CM

## 2014-04-25 HISTORY — PX: COLONOSCOPY W/ POLYPECTOMY: SHX1380

## 2014-04-25 MED ORDER — SODIUM CHLORIDE 0.9 % IV SOLN: 500.0000 mL | INTRAVENOUS | Status: DC

## 2014-04-25 NOTE — Progress Notes (Signed)
Called to room to assist during endoscopic procedure.  Patient ID and intended procedure confirmed with present staff. Received instructions for my participation in the procedure from the performing physician.  

## 2014-04-25 NOTE — Op Note (Signed)
[facility]  [address] [place], [id number]   COLONOSCOPY PROCEDURE REPORT  PATIENT: [name], [name]  MR#: [id number] BIRTHDATE: [birth year] , 53  yrs. old GENDER: Female ENDOSCOPIST: Jerene Bears, MD REFERRED GQ:[id number] Aron, M.D. PROCEDURE DATE:  04/25/2014 PROCEDURE:   Colonoscopy with snare polypectomy and Colonoscopy with cold biopsy polypectomy First Screening Colonoscopy - Avg.  risk and is 50 yrs.  old or older Yes.  Prior Negative Screening - Now for repeat screening. N/A  History of Adenoma - Now for follow-up colonoscopy & has been > or = to 3 yrs.  N/A  Polyps Removed Today? Yes. ASA CLASS:   Class II INDICATIONS:average risk screening and first colonoscopy. MEDICATIONS: MAC sedation, administered by CRNA and propofol (Diprivan) 300mg  IV  DESCRIPTION OF PROCEDURE:   After the risks benefits and alternatives of the procedure were thoroughly explained, informed consent was obtained.  A digital rectal exam revealed no rectal mass.   The LB [id number]  endoscope was introduced through the anus and advanced to the cecum, which was identified by both the appendix and ileocecal valve. No adverse events experienced. The quality of the prep was good, using MoviPrep  The instrument was then slowly withdrawn as the colon was fully examined.   COLON FINDINGS: Two sessile polyps ranging between 3-83mm in size were found in the ascending colon and descending colon. Polypectomy was performed using cold snare and with cold forceps. All resections were complete and all polyp tissue was completely retrieved.   The colon mucosa was otherwise normal.  Retroflexed views revealed hypertrophied anal papilla and no other abnormalities. The time to cecum=2 minutes 30 seconds.  Withdrawal time=11 minutes 13 seconds.  The scope was withdrawn and the procedure completed. COMPLICATIONS: There were no complications.  ENDOSCOPIC IMPRESSION: 1.   Two sessile polyps  ranging between 3-69mm in size were found in the ascending colon and descending colon; Polypectomy was performed using cold snare and with cold forceps 2.   The colon mucosa was otherwise normal  RECOMMENDATIONS: 1.  Await pathology results 2.  If the polyps removed today are proven to be adenomatous (pre-cancerous) polyps, you will need a repeat colonoscopy in 5 years.  Otherwise you should continue to follow colorectal cancer screening guidelines for "routine risk" patients with colonoscopy in 10 years.  You will receive a letter within 1-2 weeks with the results of your biopsy as well as final recommendations.  Please call my office if you have not received a letter after 3 weeks.   eSigned:  Jerene Bears, MD 04/25/2014 11:36 AM cc: The Patient and Arnette Norris MD

## 2014-04-25 NOTE — Progress Notes (Signed)
A/ox3 pleased with MAC, report to Sheila RN 

## 2014-04-25 NOTE — Patient Instructions (Signed)
YOU HAD AN ENDOSCOPIC PROCEDURE TODAY AT THE [facility]: Refer to the procedure report that was given to you for any specific questions about what was found during the examination.  If the procedure report does not answer your questions, please call your gastroenterologist to clarify.  If you requested that your care partner not be given the details of your procedure findings, then the procedure report has been included in a sealed envelope for you to review at your convenience later.  YOU SHOULD EXPECT: Some feelings of bloating in the abdomen. Passage of more gas than usual.  Walking can help get rid of the air that was put into your GI tract during the procedure and reduce the bloating. If you had a lower endoscopy (such as a colonoscopy or flexible sigmoidoscopy) you may notice spotting of blood in your stool or on the toilet paper. If you underwent a bowel prep for your procedure, then you may not have a normal bowel movement for a few days.  DIET: Your first meal following the procedure should be a light meal and then it is ok to progress to your normal diet.  A half-sandwich or bowl of soup is an example of a good first meal.  Heavy or fried foods are harder to digest and may make you feel nauseous or bloated.  Likewise meals heavy in dairy and vegetables can cause extra gas to form and this can also increase the bloating.  Drink plenty of fluids but you should avoid alcoholic beverages for 24 hours.  ACTIVITY: Your care partner should take you home directly after the procedure.  You should plan to take it easy, moving slowly for the rest of the day.  You can resume normal activity the day after the procedure however you should NOT DRIVE or use heavy machinery for 24 hours (because of the sedation medicines used during the test).    SYMPTOMS TO REPORT IMMEDIATELY: A gastroenterologist can be reached at any hour.  During normal business hours, 8:30 AM to 5:00 PM Monday through Friday,  call [phone].  After hours and on weekends, please call the GI answering service at [phone] who will take a message and have the physician on call contact you.   Following lower endoscopy (colonoscopy or flexible sigmoidoscopy):  Excessive amounts of blood in the stool  Significant tenderness or worsening of abdominal pains  Swelling of the abdomen that is new, acute  Fever of 100F or higher  FOLLOW UP: If any biopsies were taken you will be contacted by phone or by letter within the next 1-3 weeks.  Call your gastroenterologist if you have not heard about the biopsies in 3 weeks.  Our staff will call the home number listed on your records the next business day following your procedure to check on you and address any questions or concerns that you may have at that time regarding the information given to you following your procedure. This is a courtesy call and so if there is no answer at the home number and we have not heard from you through the emergency physician on call, we will assume that you have returned to your regular daily activities without incident.  SIGNATURES/CONFIDENTIALITY: You and/or your care partner have signed paperwork which will be entered into your electronic medical record.  These signatures attest to the fact that that the information above on your After Visit Summary has been reviewed and is understood.  Full responsibility of the confidentiality of this   discharge information lies with you and/or your care-partner.    Resume medications. Information given on polyps with discharge instructions. 

## 2014-04-26 ENCOUNTER — Telehealth: Payer: Self-pay | Admitting: *Deleted

## 2014-04-26 NOTE — Telephone Encounter (Signed)
  Follow up Call-  Call back number 04/25/2014  Post procedure Call Back phone  # [phone]  Permission to leave phone message Yes     Patient questions:  Do you have a fever, pain , or abdominal swelling? No. Pain Score  0 *  Have you tolerated food without any problems? Yes.    Have you been able to return to your normal activities? Yes.    Do you have any questions about your discharge instructions: Diet   No. Medications  No. Follow up visit  No.  Do you have questions or concerns about your Care? No.  Actions: * If pain score is 4 or above: No action needed, pain <4.pt did say she was having a little abd .cramping the patient says she is passing flatus now but says she didn't yesterday,so we discussed that she may have some trapped air and to drink some warm liquids and call us back if does not improve.

## 2014-04-27 ENCOUNTER — Other Ambulatory Visit: Payer: Self-pay | Admitting: Family Medicine

## 2014-05-01 ENCOUNTER — Encounter: Payer: Self-pay | Admitting: Internal Medicine

## 2014-05-01 MED ORDER — LEVOTHYROXINE SODIUM 25 MCG PO TABS: ORAL_TABLET | ORAL | Status: DC

## 2014-06-02 ENCOUNTER — Other Ambulatory Visit: Payer: Self-pay | Admitting: *Deleted

## 2014-06-02 MED ORDER — IBUPROFEN 800 MG PO TABS: 800.0000 mg | ORAL_TABLET | ORAL | Status: DC | PRN

## 2014-07-17 ENCOUNTER — Other Ambulatory Visit: Payer: Self-pay | Admitting: *Deleted

## 2014-07-17 MED ORDER — VITAMIN D (ERGOCALCIFEROL) 1.25 MG (50000 UNIT) PO CAPS: ORAL_CAPSULE | ORAL | Status: DC

## 2014-07-17 MED ORDER — IBUPROFEN 800 MG PO TABS: 800.0000 mg | ORAL_TABLET | ORAL | Status: DC | PRN

## 2014-07-17 MED ORDER — SUMATRIPTAN 20 MG/ACT NA SOLN: 1.0000 | NASAL | Status: DC | PRN

## 2014-07-17 MED ORDER — LEVOTHYROXINE SODIUM 25 MCG PO TABS: ORAL_TABLET | ORAL | Status: DC

## 2014-07-18 ENCOUNTER — Encounter (HOSPITAL_BASED_OUTPATIENT_CLINIC_OR_DEPARTMENT_OTHER): Payer: Self-pay | Admitting: *Deleted

## 2014-07-19 ENCOUNTER — Encounter (HOSPITAL_BASED_OUTPATIENT_CLINIC_OR_DEPARTMENT_OTHER): Payer: Self-pay | Admitting: *Deleted

## 2014-07-19 NOTE — Progress Notes (Signed)
NPO AFTER MN. ARRIVE AT 0600. NEEDS CBC.  WILL TAKE SYNTHROID AM DOS W/ SIPS OF WATER. REVIEWED [facility] GUIDELINES, WILL BRING MEDS.

## 2014-07-20 NOTE — H&P (Signed)
[name], [name] NO.:  [id number]  MEDICAL RECORD NO.:  [id number]  LOCATION:                                 FACILITY:  PHYSICIAN:  Maisie Fus, M.D.        DATE OF BIRTH:  DATE OF ADMISSION:  07/24/2014 DATE OF DISCHARGE:                             HISTORY & PHYSICAL   She will be admitted on July 24, 2014.  ADMITTING DIAGNOSES:  Cervical prolapse, vaginal vault descensus.  HISTORY OF PRESENT ILLNESS:  The patient is a 53 year old white married female, gravida 2, para 3 who had hysterectomy via abdominal approach several [place] back in another city.  She states that at that time she had a bladder tack also.  She was kindly referred by her general medical doctor in August of this year for pelvic prolapse issues.  At the time of her initial examination, she was found to have prolapse of her cervix which obviously implies that a supracervical hysterectomy was performed. She had a first-degree cystocele, first-degree rectocele which was essentially asymptomatic.  Her real complaint was the prolapse of the cervix.  She has requested surgical intervention for this problem and is admitted at this time for that purpose.  Potential risks of surgery and prophylactic measures to be taken have been discussed at length with her including the risk of deep vein thrombosis, postoperative hemorrhage, injury to other organs, and perioperative infection.  REVIEW OF SYSTEMS:  Otherwise negative.  There are no cardiopulmonary, GI, or GU complaints.  PAST MEDICAL HISTORY:  The patient is known to have hypothyroidism for which she takes 25 mcg of levothyroxine daily.  She also has a history of migraine headaches for which she takes Imitrex spray 20 mg for activation, one spray intranasally as needed with migraine aura.  She takes hydrochlorothiazide 12.5 mg by mouth p.r.n. only for swelling. She takes vitamin D and calcium supplements.  She takes Fioricet  for headaches.  ALLERGIES:  She is allergic to codeine.  PREVIOUS SURGICAL PROCEDURES:  Includes hysterectomy noted above and a cholecystectomy.  She has had 2 vaginal births.  SOCIAL HISTORY:  She is not a cigarette smoker.  She does not use alcohol.  FAMILY HISTORY:  Noncontributory.  PHYSICAL EXAMINATION:  HEENT:  Grossly within normal limits. GENERAL AND VITAL SIGNS:  She is moderately obese at 282 pounds, 5 feet 7-1/2 inches.  Her BMI was greater than 35.  Blood pressure in the office was 130/92. NECK:  Thyroid gland is not palpably enlarged to my examination. CHEST:  Clear to auscultation throughout. HEART:  Normal sinus rhythm without murmurs, rubs, or gallops. BREASTS:  Considered to be normal.  No palpable masses, nipple discharge, or skin change. ABDOMEN:  Soft and nontender.  No appreciable organomegaly. PELVIS:  Essential findings are noted above.  Bimanual exam reveals no palpable masses, other than the cervix, the rectovaginal exam confirms. Recent pelvic ultrasound showed no enlargement of the ovaries. EXTREMITIES:  Without cyanosis, clubbing, or edema.  ASSESSMENT:  Cervical prolapse, previous supracervical hysterectomy.  PLAN:  Excision of cervix and the sacrospinous ligament suspension of vagina if possible.     Maisie Fus, M.D.  WRN/MEDQ  D:  07/19/2014  T:  07/19/2014  Job:  [id number]

## 2014-07-23 NOTE — Anesthesia Preprocedure Evaluation (Addendum)
Anesthesia Evaluation  Patient identified by MRN, date of birth, ID band Patient awake    Reviewed: Allergy & Precautions, H&P , NPO status , Patient's Chart, lab work & pertinent test results  Airway Mallampati: II  TM Distance: >3 FB Neck ROM: full    Dental no notable dental hx. (+) Teeth Intact, Dental Advisory Given   Pulmonary neg pulmonary ROS, former smoker,  breath sounds clear to auscultation  Pulmonary exam normal       Cardiovascular Exercise Tolerance: Good negative cardio ROS  Rhythm:regular Rate:Normal     Neuro/Psych negative neurological ROS  negative psych ROS   GI/Hepatic negative GI ROS, Neg liver ROS,   Endo/Other  negative endocrine ROSHypothyroidism Morbid obesity  Renal/GU negative Renal ROS  negative genitourinary   Musculoskeletal   Abdominal (+) + obese,   Peds  Hematology negative hematology ROS (+)   Anesthesia Other Findings   Reproductive/Obstetrics negative OB ROS                         Anesthesia Physical Anesthesia Plan  ASA: III  Anesthesia Plan: General   Post-op Pain Management:    Induction: Intravenous  Airway Management Planned: LMA  Additional Equipment:   Intra-op Plan:   Post-operative Plan:   Informed Consent: I have reviewed the patients History and Physical, chart, labs and discussed the procedure including the risks, benefits and alternatives for the proposed anesthesia with the patient or authorized representative who has indicated his/her understanding and acceptance.   Dental Advisory Given  Plan Discussed with: CRNA and Surgeon  Anesthesia Plan Comments:       Anesthesia Quick Evaluation

## 2014-07-24 ENCOUNTER — Ambulatory Visit (HOSPITAL_BASED_OUTPATIENT_CLINIC_OR_DEPARTMENT_OTHER)
Admission: RE | Admit: 2014-07-24 | Discharge: 2014-07-24 | Disposition: A | Discharge status: Home / Self Care | Payer: 59 | Source: Ambulatory Visit | Attending: Obstetrics & Gynecology | Admitting: Obstetrics & Gynecology

## 2014-07-24 ENCOUNTER — Ambulatory Visit (HOSPITAL_BASED_OUTPATIENT_CLINIC_OR_DEPARTMENT_OTHER): Payer: 59 | Admitting: Anesthesiology

## 2014-07-24 ENCOUNTER — Encounter (HOSPITAL_BASED_OUTPATIENT_CLINIC_OR_DEPARTMENT_OTHER): Payer: Self-pay | Admitting: *Deleted

## 2014-07-24 ENCOUNTER — Encounter (HOSPITAL_BASED_OUTPATIENT_CLINIC_OR_DEPARTMENT_OTHER)
Admission: RE | Disposition: A | Discharge status: Home / Self Care | Payer: Self-pay | Source: Ambulatory Visit | Attending: Obstetrics & Gynecology

## 2014-07-24 DIAGNOSIS — N812 Incomplete uterovaginal prolapse: Secondary | ICD-10-CM | POA: Insufficient documentation

## 2014-07-24 DIAGNOSIS — N814 Uterovaginal prolapse, unspecified: Secondary | ICD-10-CM | POA: Diagnosis not present

## 2014-07-24 DIAGNOSIS — Z6841 Body Mass Index (BMI) 40.0 and over, adult: Secondary | ICD-10-CM | POA: Insufficient documentation

## 2014-07-24 DIAGNOSIS — E039 Hypothyroidism, unspecified: Secondary | ICD-10-CM | POA: Insufficient documentation

## 2014-07-24 DIAGNOSIS — Z87891 Personal history of nicotine dependence: Secondary | ICD-10-CM | POA: Diagnosis not present

## 2014-07-24 HISTORY — DX: Presence of spectacles and contact lenses: Z97.3

## 2014-07-24 HISTORY — DX: Personal history of colon polyps, unspecified: Z86.0100

## 2014-07-24 HISTORY — DX: Female genital prolapse, unspecified: N81.9

## 2014-07-24 HISTORY — DX: Personal history of colonic polyps: Z86.010

## 2014-07-24 HISTORY — DX: Other seasonal allergic rhinitis: J30.2

## 2014-07-24 HISTORY — DX: Personal history of diseases of the blood and blood-forming organs and certain disorders involving the immune mechanism: Z86.2

## 2014-07-24 HISTORY — DX: Nocturia: R35.1

## 2014-07-24 HISTORY — DX: Hypothyroidism, unspecified: E03.9

## 2014-07-24 HISTORY — DX: Urgency of urination: R39.15

## 2014-07-24 HISTORY — PX: VAGINAL HYSTERECTOMY: SHX2639

## 2014-07-24 HISTORY — PX: ANTERIOR AND POSTERIOR REPAIR WITH SACROSPINOUS FIXATION: SHX6536

## 2014-07-24 LAB — CBC
HCT: 36.9 % (ref 36.0–46.0)
Hemoglobin: 12 g/dL (ref 12.0–15.0)
MCH: 28.9 pg (ref 26.0–34.0)
MCHC: 32.5 g/dL (ref 30.0–36.0)
MCV: 88.9 fL (ref 78.0–100.0)
Platelets: 250 K/uL (ref 150–400)
RBC: 4.15 MIL/uL (ref 3.87–5.11)
RDW: 14.9 % (ref 11.5–15.5)
WBC: 6.6 K/uL (ref 4.0–10.5)

## 2014-07-24 SURGERY — HYSTERECTOMY, VAGINAL
Anesthesia: General | Site: Vagina

## 2014-07-24 MED ORDER — ACETAMINOPHEN 10 MG/ML IV SOLN
INTRAVENOUS | Status: DC | PRN
Start: 2014-07-24 — End: 2014-07-24
  Administered 2014-07-24: 1000 mg via INTRAVENOUS

## 2014-07-24 MED ORDER — HYDROMORPHONE HCL 1 MG/ML IJ SOLN
INTRAMUSCULAR | Status: AC
  Filled 2014-07-24: qty 1

## 2014-07-24 MED ORDER — TRAMADOL HCL 50 MG PO TABS
50.0000 mg | ORAL_TABLET | Freq: Four times a day (QID) | ORAL | Status: DC | PRN
  Administered 2014-07-24: 50 mg via ORAL
  Filled 2014-07-24: qty 1

## 2014-07-24 MED ORDER — HYDROMORPHONE HCL 1 MG/ML IJ SOLN
0.2500 mg | INTRAMUSCULAR | Status: DC | PRN
  Administered 2014-07-24: 0.25 mg via INTRAVENOUS
  Filled 2014-07-24: qty 1

## 2014-07-24 MED ORDER — LACTATED RINGERS IV SOLN
INTRAVENOUS | Status: DC
Start: 2014-07-24 — End: 2014-07-24
  Administered 2014-07-24: 14:00:00 via INTRAVENOUS
  Filled 2014-07-24: qty 1000

## 2014-07-24 MED ORDER — IBUPROFEN 600 MG PO TABS
600.0000 mg | ORAL_TABLET | Freq: Four times a day (QID) | ORAL | Status: DC | PRN
  Filled 2014-07-24: qty 1

## 2014-07-24 MED ORDER — GLYCOPYRROLATE 0.2 MG/ML IJ SOLN
INTRAMUSCULAR | Status: DC | PRN
  Administered 2014-07-24: 0.6 mg via INTRAVENOUS

## 2014-07-24 MED ORDER — DEXAMETHASONE SODIUM PHOSPHATE 10 MG/ML IJ SOLN
INTRAMUSCULAR | Status: DC | PRN
  Administered 2014-07-24: 10 mg via INTRAVENOUS

## 2014-07-24 MED ORDER — LIDOCAINE HCL (CARDIAC) 20 MG/ML IV SOLN
INTRAVENOUS | Status: DC | PRN
  Administered 2014-07-24: 100 mg via INTRAVENOUS

## 2014-07-24 MED ORDER — FENTANYL CITRATE 0.05 MG/ML IJ SOLN
INTRAMUSCULAR | Status: DC | PRN
  Administered 2014-07-24 (×3): 25 ug via INTRAVENOUS
  Administered 2014-07-24: 50 ug via INTRAVENOUS
  Administered 2014-07-24: 25 ug via INTRAVENOUS
  Administered 2014-07-24: 50 ug via INTRAVENOUS
  Administered 2014-07-24 (×4): 25 ug via INTRAVENOUS

## 2014-07-24 MED ORDER — LACTATED RINGERS IV SOLN
INTRAVENOUS | Status: DC
  Administered 2014-07-24: 06:00:00 via INTRAVENOUS
  Filled 2014-07-24: qty 1000

## 2014-07-24 MED ORDER — SUCCINYLCHOLINE CHLORIDE 20 MG/ML IJ SOLN
INTRAMUSCULAR | Status: DC | PRN
  Administered 2014-07-24: 100 mg via INTRAVENOUS

## 2014-07-24 MED ORDER — STERILE WATER FOR IRRIGATION IR SOLN
Status: DC | PRN
  Administered 2014-07-24: 500 mL

## 2014-07-24 MED ORDER — MENTHOL 3 MG MT LOZG
1.0000 | LOZENGE | OROMUCOSAL | Status: DC | PRN
  Filled 2014-07-24: qty 9

## 2014-07-24 MED ORDER — TRAMADOL HCL 50 MG PO TABS
ORAL_TABLET | ORAL | Status: AC
  Filled 2014-07-24: qty 1

## 2014-07-24 MED ORDER — SODIUM CHLORIDE 0.9 % IV SOLN
3.0000 g | INTRAVENOUS | Status: AC
  Administered 2014-07-24: 3 g via INTRAVENOUS
  Filled 2014-07-24 (×2): qty 3

## 2014-07-24 MED ORDER — NEOSTIGMINE METHYLSULFATE 10 MG/10ML IV SOLN
INTRAVENOUS | Status: DC | PRN
  Administered 2014-07-24: 4 mg via INTRAVENOUS

## 2014-07-24 MED ORDER — FENTANYL CITRATE 0.05 MG/ML IJ SOLN
INTRAMUSCULAR | Status: AC
  Filled 2014-07-24: qty 6

## 2014-07-24 MED ORDER — PROPOFOL 10 MG/ML IV BOLUS
INTRAVENOUS | Status: DC | PRN
  Administered 2014-07-24: 250 mg via INTRAVENOUS

## 2014-07-24 MED ORDER — KETOROLAC TROMETHAMINE 30 MG/ML IJ SOLN
INTRAMUSCULAR | Status: DC | PRN
  Administered 2014-07-24: 30 mg via INTRAVENOUS

## 2014-07-24 MED ORDER — LACTATED RINGERS IV BOLUS (SEPSIS)
200.0000 mL | Freq: Once | INTRAVENOUS | Status: AC
  Administered 2014-07-24: 200 mL via INTRAVENOUS
  Filled 2014-07-24: qty 250

## 2014-07-24 MED ORDER — LEVOTHYROXINE SODIUM 25 MCG PO TABS
25.0000 ug | ORAL_TABLET | Freq: Every day | ORAL | Status: DC
  Filled 2014-07-24: qty 1

## 2014-07-24 MED ORDER — PROMETHAZINE HCL 25 MG/ML IJ SOLN
12.5000 mg | INTRAMUSCULAR | Status: DC | PRN
  Administered 2014-07-24: 6.25 mg via INTRAVENOUS
  Filled 2014-07-24: qty 1

## 2014-07-24 MED ORDER — PROMETHAZINE HCL 25 MG/ML IJ SOLN
INTRAMUSCULAR | Status: AC
  Filled 2014-07-24: qty 1

## 2014-07-24 MED ORDER — ROCURONIUM BROMIDE 100 MG/10ML IV SOLN
INTRAVENOUS | Status: DC | PRN
  Administered 2014-07-24: 30 mg via INTRAVENOUS

## 2014-07-24 MED ORDER — LACTATED RINGERS IV SOLN
INTRAVENOUS | Status: DC
  Filled 2014-07-24: qty 1000

## 2014-07-24 MED ORDER — METOCLOPRAMIDE HCL 5 MG/ML IJ SOLN
INTRAMUSCULAR | Status: DC | PRN
  Administered 2014-07-24: 10 mg via INTRAVENOUS

## 2014-07-24 MED ORDER — LACTATED RINGERS IV SOLN
INTRAVENOUS | Status: DC | PRN
  Administered 2014-07-24 (×2): via INTRAVENOUS

## 2014-07-24 MED ORDER — MIDAZOLAM HCL 5 MG/5ML IJ SOLN
INTRAMUSCULAR | Status: DC | PRN
  Administered 2014-07-24 (×2): 1 mg via INTRAVENOUS

## 2014-07-24 MED ORDER — ONDANSETRON HCL 4 MG/2ML IJ SOLN
INTRAMUSCULAR | Status: DC | PRN
  Administered 2014-07-24: 4 mg via INTRAVENOUS

## 2014-07-24 MED ORDER — DEXTROSE IN LACTATED RINGERS 5 % IV SOLN
INTRAVENOUS | Status: DC
  Filled 2014-07-24: qty 1000

## 2014-07-24 MED ORDER — MIDAZOLAM HCL 2 MG/2ML IJ SOLN
INTRAMUSCULAR | Status: AC
  Filled 2014-07-24: qty 2

## 2014-07-24 SURGICAL SUPPLY — 56 items
BAG URINE DRAINAGE (UROLOGICAL SUPPLIES) ×3 IMPLANT
BANDAGE ADHESIVE 1X3 (GAUZE/BANDAGES/DRESSINGS) IMPLANT
BLADE SURG 10 STRL SS (BLADE) ×3 IMPLANT
BLADE SURG 15 STRL LF DISP TIS (BLADE) ×1 IMPLANT
BLADE SURG 15 STRL SS (BLADE) ×2
CANISTER SUCTION 2500CC (MISCELLANEOUS) ×3 IMPLANT
CATH FOLEY 2WAY SLVR  5CC 16FR (CATHETERS) ×2
CATH FOLEY 2WAY SLVR 5CC 16FR (CATHETERS) ×1 IMPLANT
CATH ROBINSON RED A/P 16FR (CATHETERS) ×3 IMPLANT
CLOSURE WOUND 1/4X4 (GAUZE/BANDAGES/DRESSINGS)
COVER TABLE BACK 60X90 (DRAPES) ×3 IMPLANT
DERMABOND ADVANCED (GAUZE/BANDAGES/DRESSINGS)
DERMABOND ADVANCED .7 DNX12 (GAUZE/BANDAGES/DRESSINGS) IMPLANT
DEVICE CAPIO SLIM SINGLE (INSTRUMENTS) ×3 IMPLANT
DRAPE LG THREE QUARTER DISP (DRAPES) ×3 IMPLANT
DRAPE UNDERBUTTOCKS STRL (DRAPE) ×3 IMPLANT
ELECT LIGASURE SHORT 9 REUSE (ELECTRODE) ×3 IMPLANT
ELECT REM PT RETURN 9FT ADLT (ELECTROSURGICAL) ×3
ELECTRODE REM PT RTRN 9FT ADLT (ELECTROSURGICAL) ×1 IMPLANT
GAUZE SPONGE 4X4 16PLY XRAY LF (GAUZE/BANDAGES/DRESSINGS) IMPLANT
GLOVE BIO SURGEON STRL SZ 6.5 (GLOVE) ×4 IMPLANT
GLOVE BIO SURGEON STRL SZ7.5 (GLOVE) ×6 IMPLANT
GLOVE BIO SURGEONS STRL SZ 6.5 (GLOVE) ×2
GLOVE BIOGEL PI IND STRL 6.5 (GLOVE) ×1 IMPLANT
GLOVE BIOGEL PI INDICATOR 6.5 (GLOVE) ×2
GLOVE ECLIPSE 6.5 STRL STRAW (GLOVE) ×3 IMPLANT
GOWN STRL REUS W/ TWL LRG LVL3 (GOWN DISPOSABLE) ×1 IMPLANT
GOWN STRL REUS W/ TWL XL LVL3 (GOWN DISPOSABLE) ×2 IMPLANT
GOWN STRL REUS W/TWL LRG LVL3 (GOWN DISPOSABLE) ×2
GOWN STRL REUS W/TWL XL LVL3 (GOWN DISPOSABLE) ×4
HOLDER FOLEY CATH W/STRAP (MISCELLANEOUS) IMPLANT
NEEDLE 1/2 CIR CATGUT .05X1.09 (NEEDLE) ×3 IMPLANT
NEEDLE SPNL 22GX3.5 QUINCKE BK (NEEDLE) ×3 IMPLANT
NS IRRIG 500ML POUR BTL (IV SOLUTION) ×3 IMPLANT
PACK BASIN DAY SURGERY FS (CUSTOM PROCEDURE TRAY) ×3 IMPLANT
PAD OB MATERNITY 4.3X12.25 (PERSONAL CARE ITEMS) ×3 IMPLANT
PAD PREP 24X48 CUFFED NSTRL (MISCELLANEOUS) ×3 IMPLANT
PENCIL BUTTON HOLSTER BLD 10FT (ELECTRODE) ×3 IMPLANT
PLUG CATH AND CAP STER (CATHETERS) IMPLANT
SHEET LAVH (DRAPES) ×3 IMPLANT
SPONGE LAP 4X18 X RAY DECT (DISPOSABLE) ×3 IMPLANT
STRIP CLOSURE SKIN 1/4X4 (GAUZE/BANDAGES/DRESSINGS) IMPLANT
SUT CAPIO ETHIBPND (SUTURE) IMPLANT
SUT CAPIO POLYGLYCOLIC (SUTURE) ×3 IMPLANT
SUT MNCRL 0 MO-4 VIOLET 18 CR (SUTURE) ×2 IMPLANT
SUT MONOCRYL 0 MO 4 18  CR/8 (SUTURE) ×4
SUT VIC AB 2-0 CT2 27 (SUTURE) ×3 IMPLANT
SYR BULB IRRIGATION 50ML (SYRINGE) ×3 IMPLANT
SYR CONTROL 10ML LL (SYRINGE) ×3 IMPLANT
SYRINGE 10CC LL (SYRINGE) IMPLANT
TOWEL OR 17X24 6PK STRL BLUE (TOWEL DISPOSABLE) ×6 IMPLANT
TRAY DSU PREP LF (CUSTOM PROCEDURE TRAY) ×3 IMPLANT
TUBE CONNECTING 12'X1/4 (SUCTIONS) ×2
TUBE CONNECTING 12X1/4 (SUCTIONS) ×4 IMPLANT
WATER STERILE IRR 500ML POUR (IV SOLUTION) ×3 IMPLANT
YANKAUER SUCT BULB TIP NO VENT (SUCTIONS) ×3 IMPLANT

## 2014-07-24 NOTE — Discharge Instructions (Signed)
°  Post Anesthesia Home Care Instructions  Activity: Get plenty of rest for the remainder of the day. A responsible adult should stay with you for 24 hours following the procedure.  For the next 24 hours, DO NOT: -Drive a car -Paediatric nurse -Drink alcoholic beverages -Take any medication unless instructed by your physician -Make any legal decisions or sign important papers.  Meals: Start with liquid foods such as gelatin or soup. Progress to regular foods as tolerated. Avoid greasy, spicy, heavy foods. If nausea and/or vomiting occur, drink only clear liquids until the nausea and/or vomiting subsides. Call your physician if vomiting continues.  Special Instructions/Symptoms: Your throat may feel dry or sore from the anesthesia or the breathing tube placed in your throat during surgery. If this causes discomfort, gargle with warm salt water. The discomfort should disappear within 24 hours. Call your surgeon if you experience:   1.  Fever over 101.0., heavy bleeding. 2.  Inability to urinate. 3.  Nausea and/or vomiting. 4.  Extreme swelling or bruising at the surgical site. 5.  Continued bleeding from the incision. 6.  Increased pain, redness or drainage from the incision. 7.  Problems related to your pain medication. 8. Any change in color, movement and/or sensation 9. Any problems and/or concerns, follow all lifting instructions by MD.

## 2014-07-24 NOTE — Transfer of Care (Signed)
Immediate Anesthesia Transfer of Care Note  Patient: [name]  Procedure(s) Performed: Procedure(s) (LRB): RESECTION OF CERVIX  (N/A) SACROSPINOUS LIGAMENT SUSPENSION  (N/A)  Patient Location: PACU  Anesthesia Type: General  Level of Consciousness: awake, sedated, patient cooperative and responds to stimulation  Airway & Oxygen Therapy: Patient Spontanous Breathing and Patient connected to face mask oxygen  Post-op Assessment: Report given to PACU RN, Post -op Vital signs reviewed and stable and Patient moving all extremities  Post vital signs: Reviewed and stable  Complications: No apparent anesthesia complications

## 2014-07-24 NOTE — Anesthesia Procedure Notes (Signed)
Procedure Name: Intubation Date/Time: 07/24/2014 7:33 AM Performed by: Justice Rocher Pre-anesthesia Checklist: Patient identified, Emergency Drugs available, Suction available and Patient being monitored Patient Re-evaluated:Patient Re-evaluated prior to inductionOxygen Delivery Method: Circle System Utilized Preoxygenation: Pre-oxygenation with 100% oxygen Intubation Type: IV induction Ventilation: Mask ventilation without difficulty Laryngoscope Size: Mac and 4 Tube type: Oral Tube size: 7.0 mm Number of attempts: 1 Airway Equipment and Method: stylet and oral airway Placement Confirmation: ETT inserted through vocal cords under direct vision,  positive ETCO2 and breath sounds checked- equal and bilateral Secured at: 21 (21) cm Tube secured with: Tape Dental Injury: Teeth and Oropharynx as per pre-operative assessment

## 2014-07-24 NOTE — Anesthesia Postprocedure Evaluation (Signed)
  Anesthesia Post-op Note  Patient: [name]  Procedure(s) Performed: Procedure(s) (LRB): RESECTION OF CERVIX  (N/A) SACROSPINOUS LIGAMENT SUSPENSION  (N/A)  Patient Location: PACU  Anesthesia Type: General  Level of Consciousness: awake and alert   Airway and Oxygen Therapy: Patient Spontanous Breathing  Post-op Pain: mild  Post-op Assessment: Post-op Vital signs reviewed, Patient's Cardiovascular Status Stable, Respiratory Function Stable, Patent Airway and No signs of Nausea or vomiting  Last Vitals:  Filed Vitals:   07/24/14 1025  BP: 117/59  Pulse: 65  Temp: 36.8 C  Resp: 16    Post-op Vital Signs: stable   Complications: No apparent anesthesia complications

## 2014-07-25 ENCOUNTER — Encounter (HOSPITAL_BASED_OUTPATIENT_CLINIC_OR_DEPARTMENT_OTHER): Payer: Self-pay | Admitting: Obstetrics & Gynecology

## 2014-07-25 NOTE — Op Note (Signed)
[name], [name] NO.:  [id number]  MEDICAL RECORD NO.:  [id number]  LOCATION:                                 FACILITY:  PHYSICIAN:  Maisie Fus, M.D.   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  OPERATIVE PROCEDURE:  Trachelectomy and sacrospinous ligament, suspension of vagina.  SURGEON:  Maisie Fus, M.D.  ASSISTANT:  Erik Obey. Grewal, M.D.  ESTIMATED INTRAOPERATIVE BLOOD LOSS:  Less than or equal to 100 mL.  INTRAOPERATIVE COMPLICATIONS:  None.  Details of the present illness recorded in the admission note.  The patient was admitted on the morning of surgery.  She was given a bolus of Ancef IV preoperatively.  She was brought to the operating room, placed in PAS hose, placed under general endotracheal anesthesia. Placed in dorsal lithotomy position using the [facility] stirrup system. Betadine prep of mons perineum and vagina were carried out in the usual fashion.  Bladder was evacuated with a sterile Robinson catheter. Sterile drapes were applied.  The cervix actually prolapsed through the vaginal introitus.  Deaver retractors were used for exposure.  The cervix was grasped with a Ardis Hughs tenaculum.  Using a 15 blade and scalpel, the cervix was circumscribed to release the vaginal mucosa from the cervix.  Careful blunt and sharp dissection were carried out using Mayo scissors and digital blunt dissection.  This allowed development of uterosacral pedicles which were clamped with Heaney's, divided sharply and ligated with suture ligature of 0 Monocryl.  Bladder pillars were similarly taken as were the additional cardinal ligament pedicle.  Each of these were ligated with suture ligature of 0  Monocryl.  Small remaining section of the apex was cross clamped and tied with a suture ligature of 0 Monocryl.  A small midline incision was made in the posterior mucosa to allow blunt dissection to the sacrospinous ligament on the right.  Capio device was then used to anchor absorbable suture into the ligament and then was attached with a free needle to the vaginal mucosa.  Partial closure of the vaginal mucosa was then carried out with a running 2-0 Vicryl suture.  The sacrospinous stitch was then tied traumatically elevating and repositioning the vaginal vault.  The remaining portion of the vaginal incision was closed with running and interrupted 2-0 Monocryl sutures. Hemostasis was complete.  The patient was awakened, taken to recovery room in good condition.     Maisie Fus, M.D.     WRN/MEDQ  D:  07/24/2014  T:  07/24/2014  Job:  [id number]

## 2014-10-11 ENCOUNTER — Other Ambulatory Visit: Payer: Self-pay | Admitting: Family Medicine

## 2014-12-26 ENCOUNTER — Other Ambulatory Visit: Payer: Self-pay | Admitting: Family Medicine

## 2014-12-27 ENCOUNTER — Other Ambulatory Visit: Payer: Self-pay | Admitting: Family Medicine

## 2015-02-02 ENCOUNTER — Other Ambulatory Visit: Payer: Self-pay | Admitting: Family Medicine

## 2015-04-02 ENCOUNTER — Other Ambulatory Visit: Payer: Self-pay | Admitting: Family Medicine

## 2015-05-08 ENCOUNTER — Other Ambulatory Visit: Payer: Self-pay | Admitting: Family Medicine

## 2015-05-08 NOTE — Telephone Encounter (Signed)
Rx denied. LM on pts vm informing her OV is required for additional refills

## 2015-05-30 ENCOUNTER — Other Ambulatory Visit: Payer: Self-pay | Admitting: Family Medicine

## 2015-05-30 DIAGNOSIS — Z1231 Encounter for screening mammogram for malignant neoplasm of breast: Secondary | ICD-10-CM

## 2015-06-05 ENCOUNTER — Ambulatory Visit
Admission: RE | Admit: 2015-06-05 | Discharge: 2015-06-05 | Disposition: A | Discharge status: Home / Self Care | Payer: No Typology Code available for payment source | Source: Ambulatory Visit | Attending: Family Medicine | Admitting: Family Medicine

## 2015-06-05 DIAGNOSIS — Z1231 Encounter for screening mammogram for malignant neoplasm of breast: Secondary | ICD-10-CM | POA: Diagnosis not present

## 2015-08-26 ENCOUNTER — Other Ambulatory Visit: Payer: Self-pay | Admitting: Family Medicine

## 2015-10-29 ENCOUNTER — Encounter: Payer: Self-pay | Admitting: Family Medicine

## 2015-10-29 ENCOUNTER — Ambulatory Visit (INDEPENDENT_AMBULATORY_CARE_PROVIDER_SITE_OTHER): Payer: No Typology Code available for payment source | Admitting: Family Medicine

## 2015-10-29 VITALS — BP 142/90 | HR 85 | Temp 98.2°F | Wt 291.0 lb

## 2015-10-29 DIAGNOSIS — R21 Rash and other nonspecific skin eruption: Secondary | ICD-10-CM | POA: Diagnosis not present

## 2015-10-29 MED ORDER — TRIAMCINOLONE ACETONIDE 0.5 % EX CREA: 1.0000 "application " | TOPICAL_CREAM | Freq: Two times a day (BID) | CUTANEOUS | Status: DC | PRN

## 2015-10-29 NOTE — Patient Instructions (Signed)
Use the cream and update [place] as needed.  Likely not shingles.   Take care.  Glad to see you.

## 2015-10-29 NOTE — Progress Notes (Signed)
Pre visit review using our clinic review tool, if applicable. No additional management support is needed unless otherwise documented below in the visit note.  Rash started Friday, about 3 days ago.  Itchy.  Bilateral, on the neck.  Sensitive, ie painful if manipulated or if she gets sweaty.   No fevers.  No sx like this rash prev.   No new soap, foods, shampoo.  No new triggers.  No other skin lesions.    Meds, vitals, and allergies reviewed.   ROS: See HPI.  Otherwise, noncontributory.  nad ncat OP wnl Neck supple, no LA nondermatomal blanching red maculopapular rash, irregular borders, present B on the neck and upper chest in patches

## 2015-10-30 DIAGNOSIS — R21 Rash and other nonspecific skin eruption: Secondary | ICD-10-CM | POA: Insufficient documentation

## 2015-10-30 NOTE — Assessment & Plan Note (Signed)
Likely not shingles, likely not infectious Likely irritant dermatitis.   D/w pt.   Use topical TAC with routine cautions and update [place] prn.  She agrees.  No clear trigger known, d/w pt.

## 2015-12-11 ENCOUNTER — Telehealth: Payer: Self-pay

## 2015-12-11 NOTE — Telephone Encounter (Signed)
Order faxed as requested

## 2015-12-11 NOTE — Telephone Encounter (Signed)
Order written and on my desk. 

## 2015-12-11 NOTE — Telephone Encounter (Signed)
Pt left v/m requesting order for labs for CPX that is scheduled for 12/17/15 faxed to [phone]; pt is having labs drawn at her work. Pt request cb.

## 2015-12-12 ENCOUNTER — Other Ambulatory Visit (INDEPENDENT_AMBULATORY_CARE_PROVIDER_SITE_OTHER): Payer: No Typology Code available for payment source

## 2015-12-12 DIAGNOSIS — Z0000 Encounter for general adult medical examination without abnormal findings: Secondary | ICD-10-CM

## 2015-12-12 DIAGNOSIS — R7309 Other abnormal glucose: Secondary | ICD-10-CM

## 2015-12-12 LAB — COMPREHENSIVE METABOLIC PANEL
ALBUMIN: 4.2 g/dL (ref 3.5–5.2)
ALK PHOS: 67 U/L (ref 39–117)
ALT: 19 U/L (ref 0–35)
AST: 16 U/L (ref 0–37)
BILIRUBIN TOTAL: 0.4 mg/dL (ref 0.2–1.2)
BUN: 14 mg/dL (ref 6–23)
CHLORIDE: 102 meq/L (ref 96–112)
CO2: 27 mEq/L (ref 19–32)
CREATININE: 0.69 mg/dL (ref 0.40–1.20)
Calcium: 9.4 mg/dL (ref 8.4–10.5)
GFR: 94.56 mL/min (ref >60.00)
Glucose, Bld: 105 mg/dL — ABNORMAL HIGH (ref 70–99)
Potassium: 3.9 mEq/L (ref 3.5–5.1)
Sodium: 137 mEq/L (ref 135–145)
TOTAL PROTEIN: 7.5 g/dL (ref 6.0–8.3)

## 2015-12-12 LAB — HEMOGLOBIN A1C: Hgb A1c MFr Bld: 5.9 % (ref 4.6–6.5)

## 2015-12-12 LAB — CBC WITH DIFFERENTIAL/PLATELET
BASOS ABS: 0 10*3/uL (ref 0.0–0.1)
Basophils Relative: 0.5 % (ref 0.0–3.0)
EOS ABS: 0.2 10*3/uL (ref 0.0–0.7)
Eosinophils Relative: 3.1 % (ref 0.0–5.0)
HCT: 38 % (ref 36.0–46.0)
HEMOGLOBIN: 12.8 g/dL (ref 12.0–15.0)
Lymphocytes Relative: 34.1 % (ref 12.0–46.0)
Lymphs Abs: 2.6 10*3/uL (ref 0.7–4.0)
MCHC: 33.7 g/dL (ref 30.0–36.0)
MCV: 88.2 fl (ref 78.0–100.0)
MONOS PCT: 7.9 % (ref 3.0–12.0)
Monocytes Absolute: 0.6 10*3/uL (ref 0.1–1.0)
Neutro Abs: 4.2 10*3/uL (ref 1.4–7.7)
Neutrophils Relative %: 54.4 % (ref 43.0–77.0)
Platelets: 278 10*3/uL (ref 150.0–400.0)
RBC: 4.31 Mil/uL (ref 3.87–5.11)
RDW: 15.2 % (ref 11.5–15.5)
WBC: 7.7 10*3/uL (ref 4.0–10.5)

## 2015-12-12 LAB — LIPID PANEL
Cholesterol: 167 mg/dL (ref 0–200)
HDL: 39.3 mg/dL (ref >39.00)
LDL CALC: 97 mg/dL (ref 0–99)
NonHDL: 127.62
Total CHOL/HDL Ratio: 4
Triglycerides: 153 mg/dL — ABNORMAL HIGH (ref 0.0–149.0)
VLDL: 30.6 mg/dL (ref 0.0–40.0)

## 2015-12-12 LAB — MICROALBUMIN / CREATININE URINE RATIO
CREATININE, U: 103.8 mg/dL
Microalb Creat Ratio: 0.7 mg/g (ref 0.0–30.0)

## 2015-12-17 ENCOUNTER — Encounter: Payer: Self-pay | Admitting: Family Medicine

## 2015-12-17 ENCOUNTER — Ambulatory Visit (INDEPENDENT_AMBULATORY_CARE_PROVIDER_SITE_OTHER): Payer: No Typology Code available for payment source | Admitting: Family Medicine

## 2015-12-17 VITALS — BP 136/72 | HR 81 | Temp 98.2°F | Ht 67.0 in | Wt 285.5 lb

## 2015-12-17 DIAGNOSIS — Z0001 Encounter for general adult medical examination with abnormal findings: Secondary | ICD-10-CM

## 2015-12-17 DIAGNOSIS — G629 Polyneuropathy, unspecified: Secondary | ICD-10-CM | POA: Diagnosis not present

## 2015-12-17 DIAGNOSIS — Z0000 Encounter for general adult medical examination without abnormal findings: Secondary | ICD-10-CM

## 2015-12-17 DIAGNOSIS — E559 Vitamin D deficiency, unspecified: Secondary | ICD-10-CM

## 2015-12-17 DIAGNOSIS — R6889 Other general symptoms and signs: Secondary | ICD-10-CM | POA: Diagnosis not present

## 2015-12-17 DIAGNOSIS — Z79890 Hormone replacement therapy (postmenopausal): Secondary | ICD-10-CM

## 2015-12-17 DIAGNOSIS — E785 Hyperlipidemia, unspecified: Secondary | ICD-10-CM | POA: Diagnosis not present

## 2015-12-17 DIAGNOSIS — E039 Hypothyroidism, unspecified: Secondary | ICD-10-CM | POA: Insufficient documentation

## 2015-12-17 DIAGNOSIS — E038 Other specified hypothyroidism: Secondary | ICD-10-CM | POA: Diagnosis not present

## 2015-12-17 LAB — T4, FREE: Free T4: 0.53 ng/dL — ABNORMAL LOW (ref 0.60–1.60)

## 2015-12-17 LAB — TSH: TSH: 3.26 u[IU]/mL (ref 0.35–4.50)

## 2015-12-17 MED ORDER — SUMATRIPTAN 20 MG/ACT NA SOLN: 1.0000 | NASAL | Status: DC | PRN

## 2015-12-17 MED ORDER — BUTALBITAL-APAP-CAFFEINE 50-325-40 MG PO TABS: 1.0000 | ORAL_TABLET | ORAL | Status: DC | PRN

## 2015-12-17 MED ORDER — IBUPROFEN 800 MG PO TABS: 800.0000 mg | ORAL_TABLET | ORAL | Status: DC | PRN

## 2015-12-17 MED ORDER — GABAPENTIN 100 MG PO CAPS: 400.0000 mg | ORAL_CAPSULE | Freq: Every day | ORAL | Status: DC

## 2015-12-17 MED ORDER — GEMFIBROZIL 600 MG PO TABS: 600.0000 mg | ORAL_TABLET | Freq: Two times a day (BID) | ORAL | Status: DC

## 2015-12-17 MED ORDER — HYDROCHLOROTHIAZIDE 12.5 MG PO CAPS: ORAL_CAPSULE | ORAL | Status: DC

## 2015-12-17 MED ORDER — LEVOTHYROXINE SODIUM 25 MCG PO TABS: ORAL_TABLET | ORAL | Status: DC

## 2015-12-17 MED ORDER — ESTROGENS CONJUGATED 0.625 MG PO TABS: 0.6250 mg | ORAL_TABLET | Freq: Every day | ORAL | Status: DC

## 2015-12-17 NOTE — Assessment & Plan Note (Signed)
She would like for me to take over prescribing HRT. Discussed risks of HRT. Dc patch eRx sent for premarin.  Does not have a uterus so does not need to add progesterone. The patient indicates understanding of these issues and agrees with the plan.

## 2015-12-17 NOTE — Progress Notes (Signed)
Pre visit review using our clinic review tool, if applicable. No additional management support is needed unless otherwise documented below in the visit note. 

## 2015-12-17 NOTE — Assessment & Plan Note (Signed)
Reviewed preventive care protocols, scheduled due services, and updated immunizations Discussed nutrition, exercise, diet, and healthy lifestyle.  

## 2015-12-17 NOTE — Assessment & Plan Note (Signed)
Deteriorated. Restart gabapentin. Call or return to clinic prn if these symptoms worsen or fail to improve as anticipated. The patient indicates understanding of these issues and agrees with the plan.

## 2015-12-17 NOTE — Addendum Note (Signed)
Addended by: Daralene Milch C on: 12/17/2015 04:08 PM   Modules accepted: Miquel Dunn

## 2015-12-17 NOTE — Progress Notes (Signed)
Subjective:    Patient ID: [name], female    DOB: 12/03/1960, 55 y.o.   MRN: [id number]  HPI  Very pleasant 55 yo female here for CPX.   Remote h/o hysterectomy and s/p resection of cervix and bladder sling on 07/24/14 Nori Riis). Mammogram 06/05/15 Colonoscopy 04/25/14  Neuropathy- h/o tingling in legs at night.  Gabapentin had helped but it was stopped prior to her GYN surgery in 07/2015 and she never restarted it.  On HRT for hot flashes- minivelle patch.  She is asking for a pill instead.  Feels the patch is not sticking well when she sweats.    HLD- on Zocor and Lopid. Cholesterol much improved! Lab Results  Component Value Date   CHOL 167 12/12/2015   HDL 39.30 12/12/2015   LDLCALC 97 12/12/2015   TRIG 153.0* 12/12/2015   CHOLHDL 4 12/12/2015    Lab Results  Component Value Date   NA 137 12/12/2015   K 3.9 12/12/2015   CL 102 12/12/2015   CO2 27 12/12/2015   Lab Results  Component Value Date   WBC 7.7 12/12/2015   HGB 12.8 12/12/2015   HCT 38.0 12/12/2015   MCV 88.2 12/12/2015   PLT 278.0 12/12/2015   Lab Results  Component Value Date   TSH 3.00 03/15/2014   Lab Results  Component Value Date   ALT 19 12/12/2015   AST 16 12/12/2015   ALKPHOS 67 12/12/2015   BILITOT 0.4 12/12/2015    Patient Active Problem List   Diagnosis Date Noted  . Cervical prolapse 07/24/2014  . Neuropathy ([place]) 03/24/2014  . Female bladder prolapse, acquired 03/24/2014  . 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 Elevated TSH 05/09/2013  . Eosinophil count raised 05/09/2013  . Menopausal syndrome (hot flashes) 03/04/2013  . Elevated hemoglobin A1c measurement 11/11/2012  . Obesity 03/31/2011  . Routine general medical examination at a health care facility 03/31/2011  . Hyperlipidemia   . Anxiety   . Vitamin D deficiency    Past Medical History  Diagnosis Date  . Hyperlipidemia   . Migraine   . Anxiety   . Vitamin D deficiency   . History of eosinophilia     ESOPHAGEAL  .  Hypothyroidism   . Pelvic prolapse   . Seasonal allergies   . History of colon polyps   . Urgency of urination   . Nocturia   . Wears glasses    Past Surgical History  Procedure Laterality Date  . Supracervical abdominal hysterectomy  2001    w/  Bladder Suspension  . Colonoscopy w/ polypectomy  04-25-2014  . Laparoscopic cholecystectomy  2008  . Vaginal hysterectomy N/A 07/24/2014    Procedure: RESECTION OF CERVIX ;  Surgeon: Maisie Fus, MD;  Location: [facility];  Service: Gynecology;  Laterality: N/A;  . Anterior and posterior repair with sacrospinous fixation N/A 07/24/2014    Procedure: SACROSPINOUS LIGAMENT SUSPENSION ;  Surgeon: Maisie Fus, MD;  Location: [facility];  Service: Gynecology;  Laterality: N/A;   Social History  Substance Use Topics  . Smoking status: Former Smoker -- 1.00 packs/day for 1 years    Types: Cigarettes    Quit date: 07/19/1982  . Smokeless tobacco: Never Used  . Alcohol Use: No   Family History  Problem Relation Age of Onset  . Hyperlipidemia Mother   . Colon cancer Neg Hx    Allergies  Allergen Reactions  . Codeine Nausea And Vomiting   Current Outpatient Prescriptions on  File Prior to Visit  Medication Sig Dispense Refill  . butalbital-acetaminophen-caffeine (FIORICET) 50-325-40 MG per tablet Take 1 tablet by mouth as needed. 30 tablet 0  . Cholecalciferol (VITAMIN D3) 2000 UNITS TABS Take 6,000 Units by mouth daily. Reported on 10/29/2015    . diphenhydrAMINE (BENADRYL) 25 MG tablet Take 25 mg by mouth every 4 (four) hours as needed.     . hydrochlorothiazide (MICROZIDE) 12.5 MG capsule TAKE 1 CAPSULE BY MOUTH DAILY AS NEEDED FOR LOWER EXTREMITY EDEMA. 30 capsule 2  . ibuprofen (ADVIL,MOTRIN) 800 MG tablet TAKE 1 TABLET BY MOUTH AS NEEDED 30 tablet 2  . levothyroxine (SYNTHROID, LEVOTHROID) 25 MCG tablet TAKE ONE TABLET DAILY BEFORE BREAKFAST. **NEEDS FOLLOW UP WITH LABS PRIOR TO FURTHER REFILLS** 30  tablet 0  . SUMAtriptan (IMITREX) 20 MG/ACT nasal spray Place 1 spray (20 mg total) into the nose as needed. 24 Inhaler 0  . triamcinolone cream (KENALOG) 0.5 % Apply 1 application topically 2 (two) times daily as needed. 30 g 0   No current facility-administered medications on file prior to visit.   The PMH, PSH, Social History, Family History, Medications, and allergies have been reviewed in [facility], and have been updated if relevant.   Review of Systems  Constitutional: Negative.   HENT: Negative.   Eyes: Negative.   Respiratory: Negative.  Negative for apnea, cough, chest tightness and shortness of breath.   Cardiovascular: Negative for chest pain, palpitations and leg swelling.  Gastrointestinal: Negative for abdominal pain, diarrhea, constipation, blood in stool and abdominal distention.  Endocrine: Positive for heat intolerance. Negative for cold intolerance.  Genitourinary: Negative for dysuria, flank pain and difficulty urinating.  Musculoskeletal: Positive for arthralgias and gait problem.  Neurological: Negative.   Hematological: Negative.   Psychiatric/Behavioral: Negative.   All other systems reviewed and are negative.      Objective:   Physical Exam BP 136/72 mmHg  Pulse 81  Temp(Src) 98.2 F (36.8 C) (Oral)  Ht 5\' 7"  (1.702 m)  Wt 285 lb 8 oz (129.502 kg)  BMI 44.71 kg/m2  SpO2 98% Wt Readings from Last 3 Encounters:  12/17/15 285 lb 8 oz (129.502 kg)  10/29/15 291 lb (131.997 kg)  07/24/14 280 lb (127.007 kg)    General:  Well-developed,well-nourished,in no acute distress; alert,appropriate and cooperative throughout examination Head:  normocephalic and atraumatic.   Eyes:  vision grossly intact, pupils equal, pupils round, and pupils reactive to light.   Ears:  R ear normal and L ear normal.   Nose:  no external deformity.   Mouth:  good dentition.   Neck:  No deformities, masses, or tenderness noted. Breasts:  No mass, nodules, thickening, tenderness,  bulging, retraction, inflamation, nipple discharge or skin changes noted.   Lungs:  Normal respiratory effort, chest expands symmetrically. Lungs are clear to auscultation, no crackles or wheezes. Heart:  Normal rate and regular rhythm. S1 and S2 normal without gallop, murmur, click, rub or other extra sounds. Abdomen:  Bowel sounds positive,abdomen soft and non-tender without masses, organomegaly or hernias noted. Msk:  No deformity or scoliosis noted of thoracic or lumbar spine.   Extremities:  No clubbing, cyanosis, edema, or deformity noted with normal full range of motion of all joints.   Neurologic:  alert & oriented X3 and gait normal.   Skin:  Intact without suspicious lesions or rashes Cervical Nodes:  No lymphadenopathy noted Axillary Nodes:  No palpable lymphadenopathy Psych:  Cognition and judgment appear intact. Alert and cooperative with normal attention  span and concentration. No apparent delusions, illusions, hallucinations        Assessment & Plan:

## 2015-12-17 NOTE — Patient Instructions (Signed)
Good to see you We are starting Premarin. Please keep me updated with your symptoms.

## 2015-12-17 NOTE — Assessment & Plan Note (Signed)
Continue current dose of synthroid. Check thyroid labs today. 

## 2015-12-18 ENCOUNTER — Encounter: Payer: Self-pay | Admitting: Family Medicine

## 2016-01-17 ENCOUNTER — Telehealth: Payer: Self-pay

## 2016-01-17 NOTE — Telephone Encounter (Signed)
Pt said cost of premarin for pt is $75.00. Rafia wants to know if we have discount card for Premarin. We do not have a discount premarin card; pt found one on line and will ck with pharmacy. Nothing further needed.

## 2016-01-30 ENCOUNTER — Other Ambulatory Visit: Payer: Self-pay | Admitting: Family Medicine

## 2016-04-18 ENCOUNTER — Ambulatory Visit (INDEPENDENT_AMBULATORY_CARE_PROVIDER_SITE_OTHER): Payer: No Typology Code available for payment source | Admitting: Family Medicine

## 2016-04-18 ENCOUNTER — Encounter: Payer: Self-pay | Admitting: Family Medicine

## 2016-04-18 DIAGNOSIS — J0100 Acute maxillary sinusitis, unspecified: Secondary | ICD-10-CM

## 2016-04-18 MED ORDER — AMOXICILLIN-POT CLAVULANATE 875-125 MG PO TABS: 1.0000 | ORAL_TABLET | Freq: Two times a day (BID) | ORAL | 0 refills | Status: DC

## 2016-04-18 MED ORDER — ALBUTEROL SULFATE HFA 108 (90 BASE) MCG/ACT IN AERS: 1.0000 | INHALATION_SPRAY | Freq: Four times a day (QID) | RESPIRATORY_TRACT | 0 refills | Status: DC | PRN

## 2016-04-18 NOTE — Patient Instructions (Signed)
Use the inhaler in the meantime as needed.  Start augmentin and update [place] as needed.  Take care.  Glad to see you.

## 2016-04-18 NOTE — Progress Notes (Signed)
Has had the cough since June 2017.  Patient has taken Doxycycline, Amoxicillin, Cheratussin AC, Tessalon Perles, Mucinex-D, all to no avail.    She had a rash with doxycycline.    Some occ sputum.  Green rhinorrhea.  Cough continues.  Chest feels tight occ, with the coughing fits. Fatigued.  She has been sick consistently for 2 months.  No sick contacts.  Some occ chills.  No known fevers.  L ear feels stuffy.  Facial pain noted.  No ADE on amoxil.     Meds, vitals, and allergies reviewed.   ROS: Per HPI unless specifically indicated in ROS section   GEN: nad, alert and oriented HEENT: mucous membranes moist, tm w/o erythema, nasal exam w/o erythema, clear discharge noted,  OP with cobblestoning, max > frontal sinuses ttp NECK: supple w/o LA CV: rrr.   PULM: diffuse B mild wheezes, no inc wob, no focal dec in BS. EXT: no edema

## 2016-04-18 NOTE — Progress Notes (Signed)
Pre visit review using our clinic review tool, if applicable. No additional management support is needed unless otherwise documented below in the visit note.  Has had the cough since June 2017.  Patient has taken Doxycycline, Amoxicillin, Cheratussin AC, Tessalon Perles, Mucinex-D, all to no avail.

## 2016-04-21 DIAGNOSIS — J0100 Acute maxillary sinusitis, unspecified: Secondary | ICD-10-CM | POA: Insufficient documentation

## 2016-04-21 NOTE — Assessment & Plan Note (Signed)
Nontoxic. Okay for outpatient follow-up. Discussed with patient, and she agrees. Change to Augmentin. Use albuterol as needed for wheeze or cough. Rest and fluids, update [place] as needed. She agrees.

## 2016-04-26 ENCOUNTER — Other Ambulatory Visit: Payer: Self-pay | Admitting: Family Medicine

## 2016-05-09 ENCOUNTER — Encounter: Payer: Self-pay | Admitting: Family Medicine

## 2016-05-09 ENCOUNTER — Ambulatory Visit (INDEPENDENT_AMBULATORY_CARE_PROVIDER_SITE_OTHER): Payer: No Typology Code available for payment source | Admitting: Family Medicine

## 2016-05-09 DIAGNOSIS — J0100 Acute maxillary sinusitis, unspecified: Secondary | ICD-10-CM

## 2016-05-09 DIAGNOSIS — B029 Zoster without complications: Secondary | ICD-10-CM

## 2016-05-09 MED ORDER — VALACYCLOVIR HCL 1 G PO TABS: 1000.0000 mg | ORAL_TABLET | Freq: Three times a day (TID) | ORAL | 0 refills | Status: DC

## 2016-05-09 MED ORDER — AMOXICILLIN-POT CLAVULANATE 875-125 MG PO TABS: 1.0000 | ORAL_TABLET | Freq: Two times a day (BID) | ORAL | 0 refills | Status: DC

## 2016-05-09 NOTE — Assessment & Plan Note (Signed)
Not much pain.  Dx d/w pt.  Start valtrex. Routine cautions given.  Note for work given.  She agrees.  Update [place] as needed.

## 2016-05-09 NOTE — Progress Notes (Signed)
Pre visit review using our clinic review tool, if applicable. No additional management support is needed unless otherwise documented below in the visit note. 

## 2016-05-09 NOTE — Assessment & Plan Note (Signed)
Possible that prev augmentin course wasn't long enough, vs reinfection.  D/w pt.  Restart augmentin.  She had tolerated prev.  Okay for outpatient f/u.

## 2016-05-09 NOTE — Patient Instructions (Signed)
Start valtrex, don't get sunburned.   Start augmentin, continue flonase.  Take care.  Glad to see you.

## 2016-05-09 NOTE — Progress Notes (Signed)
Seen about 3 weeks ago.  She was getting some better initially.  But now with L ear pain and L facial pain.  Was able to tolerate augmentin.  Fever noted last week, not since.    Now with rash on the L arm, same as with doxy prev but now not on abx.  Started about 4 days ago.  Rash itches and burns, dermatomal only on the L arm.    Meds, vitals, and allergies reviewed.   ROS: Per HPI unless specifically indicated in ROS section   GEN: nad, alert and oriented HEENT: mucous membranes moist, tms pink but not red, nasal exam w/o erythema, clear discharge noted,  OP with cobblestoning NECK: supple w/o LA CV: rrr.   PULM: ctab, no inc wob EXT: no edema SKIN: L arm with dermatomal rash and paresthesia L max ttp

## 2016-05-13 ENCOUNTER — Encounter: Payer: Self-pay | Admitting: Family Medicine

## 2016-05-14 ENCOUNTER — Other Ambulatory Visit: Payer: Self-pay | Admitting: Family Medicine

## 2016-05-14 ENCOUNTER — Encounter: Payer: Self-pay | Admitting: Family Medicine

## 2016-05-28 ENCOUNTER — Other Ambulatory Visit: Payer: Self-pay | Admitting: Family Medicine

## 2016-05-28 DIAGNOSIS — Z1231 Encounter for screening mammogram for malignant neoplasm of breast: Secondary | ICD-10-CM

## 2016-06-04 ENCOUNTER — Ambulatory Visit (INDEPENDENT_AMBULATORY_CARE_PROVIDER_SITE_OTHER): Payer: No Typology Code available for payment source | Admitting: Family Medicine

## 2016-06-04 ENCOUNTER — Encounter: Payer: Self-pay | Admitting: Family Medicine

## 2016-06-04 DIAGNOSIS — J0100 Acute maxillary sinusitis, unspecified: Secondary | ICD-10-CM | POA: Diagnosis not present

## 2016-06-04 MED ORDER — DOXYCYCLINE HYCLATE 100 MG PO TABS: 100.0000 mg | ORAL_TABLET | Freq: Two times a day (BID) | ORAL | 0 refills | Status: DC

## 2016-06-04 NOTE — Progress Notes (Signed)
Pre visit review using our clinic review tool, if applicable. No additional management support is needed unless otherwise documented below in the visit note. 

## 2016-06-04 NOTE — Patient Instructions (Signed)
Finish the prednisone, add on doxycycline and update [place] as needed.  Take care.  Glad to see you.

## 2016-06-04 NOTE — Progress Notes (Signed)
On prednisone per ENT clinic.  Known L ear with SOM per ENT.  She has f/u pending for 06/19/16; with possible need for PE tube at that point.  She is back on valtrex in the meantime per ENT to try to prevent another episode of shingles. Seeing [facility], Dr. Tami Ribas.  Now more recently with green sputum, rhinorrhea over the last few days.  Fever recently.  Some facial discomfort.  ST.  L ear pain.  No vomiting, some diarrhea for the last 2 days.    Her prev rash was likely from zoster, not from doxy. D/w pt.  Allergy list updated.    Meds, vitals, and allergies reviewed.   ROS: Per HPI unless specifically indicated in ROS section   GEN: nad, alert and oriented HEENT: mucous membranes moist, R tm w/o erythema, L SOM noted, nasal exam w/o erythema, clear discharge noted,  OP with cobblestoning, L max sinus ttp.  NECK: supple w/o LA CV: rrr.   PULM: ctab, no inc wob EXT: no edema SKIN: no acute rash

## 2016-06-05 NOTE — Assessment & Plan Note (Signed)
Likely previous rash was related to shingles, not doxycycline. Continue prednisone. Restart doxycycline. Routine cautions given. Follow-up as needed. Nontoxic. She agrees.

## 2016-06-08 ENCOUNTER — Other Ambulatory Visit: Payer: Self-pay | Admitting: Family Medicine

## 2016-06-09 ENCOUNTER — Ambulatory Visit
Admission: RE | Admit: 2016-06-09 | Discharge: 2016-06-09 | Disposition: A | Discharge status: Home / Self Care | Payer: No Typology Code available for payment source | Source: Ambulatory Visit | Attending: Family Medicine | Admitting: Family Medicine

## 2016-06-09 DIAGNOSIS — Z1231 Encounter for screening mammogram for malignant neoplasm of breast: Secondary | ICD-10-CM | POA: Insufficient documentation

## 2016-06-19 ENCOUNTER — Other Ambulatory Visit: Payer: Self-pay | Admitting: Unknown Physician Specialty

## 2016-06-19 ENCOUNTER — Ambulatory Visit
Admission: RE | Admit: 2016-06-19 | Discharge: 2016-06-19 | Disposition: A | Discharge status: Home / Self Care | Payer: No Typology Code available for payment source | Source: Ambulatory Visit | Attending: Unknown Physician Specialty | Admitting: Unknown Physician Specialty

## 2016-06-19 DIAGNOSIS — R059 Cough, unspecified: Secondary | ICD-10-CM

## 2016-06-19 DIAGNOSIS — R05 Cough: Secondary | ICD-10-CM

## 2016-08-22 ENCOUNTER — Telehealth: Payer: Self-pay

## 2016-08-22 DIAGNOSIS — R05 Cough: Secondary | ICD-10-CM

## 2016-08-22 DIAGNOSIS — R059 Cough, unspecified: Secondary | ICD-10-CM

## 2016-08-22 NOTE — Telephone Encounter (Signed)
I have not seen her for this complaint but I have reviewed the chart and placed the referral.

## 2016-08-22 NOTE — Telephone Encounter (Signed)
Pt left v/m; pt has had cold symptoms on and off for 6 months. The cough has been pretty much continuous and pt wants to get pulmonary referral. Pt has been seen x 3 since 04/2016; last seen 06/04/16. Pt request cb with appt.

## 2016-09-04 ENCOUNTER — Institutional Professional Consult (permissible substitution): Payer: No Typology Code available for payment source | Admitting: Pulmonary Disease

## 2016-09-12 ENCOUNTER — Ambulatory Visit (INDEPENDENT_AMBULATORY_CARE_PROVIDER_SITE_OTHER): Payer: No Typology Code available for payment source | Admitting: Pulmonary Disease

## 2016-09-12 ENCOUNTER — Encounter: Payer: Self-pay | Admitting: Pulmonary Disease

## 2016-09-12 VITALS — BP 134/82 | HR 79 | Wt 273.0 lb

## 2016-09-12 DIAGNOSIS — J310 Chronic rhinitis: Secondary | ICD-10-CM | POA: Diagnosis not present

## 2016-09-12 DIAGNOSIS — K219 Gastro-esophageal reflux disease without esophagitis: Secondary | ICD-10-CM

## 2016-09-12 DIAGNOSIS — R059 Cough, unspecified: Secondary | ICD-10-CM

## 2016-09-12 DIAGNOSIS — R05 Cough: Secondary | ICD-10-CM | POA: Diagnosis not present

## 2016-09-12 MED ORDER — BECLOMETHASONE DIPROPIONATE 40 MCG/ACT IN AERS: 2.0000 | INHALATION_SPRAY | Freq: Two times a day (BID) | RESPIRATORY_TRACT | 10 refills | Status: DC

## 2016-09-12 MED ORDER — OMEPRAZOLE 40 MG PO CPDR
40.0000 mg | DELAYED_RELEASE_CAPSULE | Freq: Every day | ORAL | 10 refills | Status: DC
Start: 2016-09-12 — End: 2016-12-19

## 2016-09-12 MED ORDER — FLUTICASONE PROPIONATE 50 MCG/ACT NA SUSP: 2.0000 | Freq: Every day | NASAL | 2 refills | Status: DC

## 2016-09-12 NOTE — Patient Instructions (Addendum)
Sometimes the process that starts the cough is not necessarily the process that perpetuates it. For example, it is common for a upper respiratory tract infection to cause a cough and the cough to persist long after the infection is gone due to other factors such as acid reflux or asthmatic bronchitis. Occasionally cough can be made worse by dry powder inhalers such as the Pulmicort inhaler that you are on. The most common causes of chronic cough (cough lasting more than 3 months) include (1) nasal and sinus inflammation, (2) acid reflux, (3) asthma or some other form of inflammation in your lower respiratory tract  In your case, the exact cause of your cough is not entirely clear and it might be more than one cause. You definitely have inflammation in your nasal passages. In situations such as this, we should treat all 3 of the above causes until we get the cough controlled and then try discontinuation of the various medications one at a time.To that end, I suggest the following  1) Change Pulmicort to Qvar inhaler (this one is not a dry powder inhaler) 2) Resume Flonase as prescribed by Dr Tami Ribas 3) Resume antihistamine/decongestant as you were previously using 4) Begin omeprazole 40 mg daily at bedtime   Follow up in 4 weeks. Call sooner if needed

## 2016-09-16 ENCOUNTER — Telehealth: Payer: Self-pay | Admitting: Pulmonary Disease

## 2016-09-16 NOTE — Telephone Encounter (Signed)
Patient calling re: q var inhaler.  [name] says insurance requires a prior [place].

## 2016-09-16 NOTE — Telephone Encounter (Signed)
Pt. Called and stated that she heard that her qvar is needing a PA. Will route to the [place] office to look out for this fax, she stated her pharmacy has faxed this today

## 2016-09-17 NOTE — Telephone Encounter (Signed)
Received message Qvar needs a PA. Do you want me to proceed with PA or change to an alternative which are 1 of the following: Arnuity, Flovent or Pulmicort. Please advise.

## 2016-09-18 NOTE — Telephone Encounter (Signed)
Initiated PA for Qvar thru [facility]. It was denied.   Denied today  Request Reference Number: [id number]. QVAR AER 40MCG is denied due to Plan Exclusion. For further questions, call [phone]. Appeals are not supported through [facility]. Please refer to the written case notice for appeals information and instructions.    Please advise on alternative below:  Arnuity, Flovent or Pulmicort.

## 2016-09-24 NOTE — Progress Notes (Signed)
PULMONARY CONSULT NOTE  Requesting MD/Service: Deborra Medina Date of initial consultation: 09/12/16 Reason for consultation: cough  HPI:  71 F with disabling cough. She initially suffered a "bad cold" in July with head congestion, nasal congestion and bilateral ear fullness. She has been treated with prednisone and multiple rounds of antibiotics which seemingly helped transiently. She was on an antihistamine/decongestant with improvement in her symptoms but is no longer taking that either. She has been prescribed a Pulmicort DPI. She denies CP, fever, purulent sputum, hemoptysis, LE edema and calf tenderness.   Past Medical History:  Diagnosis Date  . Anxiety   . History of colon polyps   . History of eosinophilia    ESOPHAGEAL  . Hyperlipidemia   . Hypothyroidism   . Migraine   . Nocturia   . Pelvic prolapse   . Seasonal allergies   . Urgency of urination   . Vitamin D deficiency   . Wears glasses     Past Surgical History:  Procedure Laterality Date  . ANTERIOR AND POSTERIOR REPAIR WITH SACROSPINOUS FIXATION N/A 07/24/2014   Procedure: SACROSPINOUS LIGAMENT SUSPENSION ;  Surgeon: Maisie Fus, MD;  Location: [facility];  Service: Gynecology;  Laterality: N/A;  . COLONOSCOPY W/ POLYPECTOMY  04-25-2014  . LAPAROSCOPIC CHOLECYSTECTOMY  2008  . SUPRACERVICAL ABDOMINAL HYSTERECTOMY  2001   w/  Bladder Suspension  . VAGINAL HYSTERECTOMY N/A 07/24/2014   Procedure: RESECTION OF CERVIX ;  Surgeon: Maisie Fus, MD;  Location: [facility];  Service: Gynecology;  Laterality: N/A;    MEDICATIONS: I have reviewed all medications and confirmed regimen as documented  Social History   Social History  . Marital status: Married    Spouse name: N/A  . Number of children: 3  . Years of education: N/A   Occupational History  .  [employer]   Social History Main Topics  . Smoking status: Former Smoker    Packs/day: 1.00    Years: 1.00    Types:  Cigarettes    Quit date: 07/19/1982  . Smokeless tobacco: Never Used  . Alcohol use No  . Drug use: No  . Sexual activity: Not on file   Other Topics Concern  . Not on file   Social History Narrative  . No narrative on file    Family History  Problem Relation Age of Onset  . Hyperlipidemia Mother   . Colon cancer Neg Hx     ROS: No fever, myalgias/arthralgias, unexplained weight loss or weight gain No new focal weakness or sensory deficits No otalgia, hearing loss, visual changes, nasal and sinus symptoms, mouth and throat problems No neck pain or adenopathy No abdominal pain, N/V/D, diarrhea, change in bowel pattern No dysuria, change in urinary pattern   Vitals:   09/12/16 1401  BP: 134/82  Pulse: 79  SpO2: 97%  Weight: 123.8 kg (273 lb)     EXAM:  Gen: Obese, No overt respiratory distress HEENT: NCAT, sclera white, oropharynx normal, severe bilateral rhinitis Neck: Supple without LAN, thyromegaly, JVD Lungs: breath sounds full, percussion normal, No wheezes Cardiovascular: RRR, no murmurs noted Abdomen: Soft, nontender, normal BS Ext: without clubbing, cyanosis, edema Neuro: CNs grossly intact, motor and sensory intact Skin: Limited exam, no lesions noted  DATA:   BMP Latest Ref Rng & Units 12/12/2015 03/15/2014  Glucose 70 - 99 mg/dL 105(H) -  BUN 6 - 23 mg/dL 14 -  Creatinine 0.40 - 1.20 mg/dL 0.69 0.7  Sodium 135 -  145 mEq/L 137 -  Potassium 3.5 - 5.1 mEq/L 3.9 -  Chloride 96 - 112 mEq/L 102 -  CO2 19 - 32 mEq/L 27 -  Calcium 8.4 - 10.5 mg/dL 9.4 -    CBC Latest Ref Rng & Units 12/12/2015 07/24/2014 03/15/2014  WBC 4.0 - 10.5 K/uL 7.7 6.6 6.8  Hemoglobin 12.0 - 15.0 g/dL 12.8 12.0 12.2  Hematocrit 36.0 - 46.0 % 38.0 36.9 38  Platelets 150.0 - 400.0 K/uL 278.0 250 283    CXR (06/19/16): No acute cardiopulmonary disease   IMPRESSION:     ICD-9-CM ICD-10-CM   1. Cough 786.2 R05 Spirometry with Graph     Spirometry with Graph  2. Chronic  rhinitis, unspecified type 472.0 J31.0   3. Gastroesophageal reflux disease, possible 530.81 K21.9    Sometimes the process that starts the cough is not necessarily the process that perpetuates it. For example, it is common for a upper respiratory tract infection to cause a cough and the cough to persist long after the infection is gone due to other factors such as acid reflux or asthmatic bronchitis. Occasionally cough can be made worse by dry powder inhalers such as the Pulmicort inhaler that you are on. The most common causes of chronic cough (cough lasting more than 3 months) include (1) nasal and sinus inflammation, (2) acid reflux, (3) asthma or some other form of inflammation in your lower respiratory tract  In your case, the exact cause of your cough is not entirely clear and it might be more than one cause. You definitely have inflammation in your nasal passages. In situations such as this, we should treat all 3 of the above causes until we get the cough controlled and then try discontinuation of the various medications one at a time.To that end, I suggest the following  PLAN:  1) Change Pulmicort to Qvar inhaler (this one is not a dry powder inhaler) 2) Resume Flonase as prescribed by Dr Tami Ribas 3) Resume antihistamine/decongestant as you were previously using 4) Begin omeprazole 40 mg daily at bedtime  Follow up in 4 weeks. Call sooner if needed   Merton Border, MD PCCM service Mobile [phone] Pager [phone] 09/24/2016

## 2016-09-26 ENCOUNTER — Other Ambulatory Visit: Payer: Self-pay

## 2016-09-26 NOTE — Telephone Encounter (Signed)
Pt needs to know status of PA for this medication. Please call.

## 2016-09-26 NOTE — Telephone Encounter (Signed)
Please advise on message below.

## 2016-09-29 NOTE — Telephone Encounter (Signed)
Qvar was denied. Please on medication change. No alternatives given.

## 2016-10-06 ENCOUNTER — Other Ambulatory Visit: Payer: Self-pay | Admitting: Family Medicine

## 2016-10-10 ENCOUNTER — Institutional Professional Consult (permissible substitution): Payer: No Typology Code available for payment source | Admitting: Internal Medicine

## 2016-10-14 MED ORDER — BECLOMETHASONE DIPROPIONATE 40 MCG/ACT IN AERS: 2.0000 | INHALATION_SPRAY | Freq: Two times a day (BID) | RESPIRATORY_TRACT | 10 refills | Status: DC

## 2016-10-15 NOTE — Telephone Encounter (Signed)
LMOM for pt to return call. 

## 2016-10-15 NOTE — Telephone Encounter (Signed)
Would you like for me to send in a different medication since Qvar was denied? No alternatives given.

## 2016-10-15 NOTE — Telephone Encounter (Signed)
I need to know what inhaled corticosteroid will be covered.   Waunita Schooner

## 2016-10-16 NOTE — Telephone Encounter (Signed)
Pt has called back and said that the [place] is the one she was on when she seen you at first and that you told her it was a problem for her due to the powder.

## 2016-10-16 NOTE — Telephone Encounter (Signed)
Pt called insurance and below is what they told her would take place of Qvar and the copay amount.  Arnuity $15.50 Flovent Diskus $16.65 Flovent HFA $100 Pulmicort flexhaler $100   Please advise on replacement for Qvar.

## 2016-10-16 NOTE — Telephone Encounter (Signed)
Pt states she will call insurance company and call back with any alternatives they give her.

## 2016-10-17 NOTE — Telephone Encounter (Signed)
She may resume that until she sees me again  Waunita Schooner

## 2016-10-21 NOTE — Telephone Encounter (Signed)
You want her to resume Pulmicort?

## 2016-10-28 ENCOUNTER — Ambulatory Visit: Payer: No Typology Code available for payment source | Admitting: Pulmonary Disease

## 2016-11-02 NOTE — Progress Notes (Signed)
PULMONARY OFFICE FOLLOW UP NOTE  Requesting MD/Service: Deborra Medina Date of initial consultation: 09/12/16 Reason for consultation: cough  PT PROFILE:  5 F initially evaluated for disabling cough of several months duration. She initially suffered a "bad cold" in 07/17 with head congestion, nasal congestion and bilateral ear fullness. She was treated with prednisone and multiple rounds of antibiotics which seemingly helped transiently. She was on an antihistamine/decongestant with improvement in her symptoms but was no longer taking it. She was on Pulmicort DPI at the time of initial evaluation. Her initial exam revealed severe bilateral rhinitis.  Initial plan:  1) Change Pulmicort to Qvar inhaler (this one is not a dry powder inhaler) 2) Resume Flonase as prescribed by Dr Tami Ribas 3) Resume antihistamine/decongestant as you were previously using 4) Begin omeprazole 40 mg daily at bedtime  DATA: Office spirometry 09/12/16: no obstruction CXR 11/03/16: mild bronchitic changes   SUBJ: She is not significantly better. She was not able to get the Qvar approved. We ultimately called in Symbicort and provided her with samples due to insufficient insurance coverage. She believes the Symbicort helps her cough some. She remains moderately impaired with cough, especially at work where coworkers comment on it. She also has cough at night preventing her from getting restful sleep. Her cough remains minimally productive. Denies CP, fever, purulent sputum, hemoptysis, LE edema and calf tenderness   Vitals:   11/03/16 0903  BP: 132/86  Pulse: 96  SpO2: 97%  Weight: 272 lb (123.4 kg)     EXAM:  Gen: NAD HEENT: NCAT, minimal rhinitis Neck: No JVD Lungs: breath sounds full, isolated end inspiratory pop in RUL posteriorly Cardiovascular: RRR, no murmurs noted Abdomen: Soft, nontender, normal BS Ext: without clubbing, cyanosis, edema Neuro: grossly intact  DATA:   BMP Latest Ref Rng & Units  12/12/2015 03/15/2014  Glucose 70 - 99 mg/dL 105(H) -  BUN 6 - 23 mg/dL 14 -  Creatinine 0.40 - 1.20 mg/dL 0.69 0.7  Sodium 135 - 145 mEq/L 137 -  Potassium 3.5 - 5.1 mEq/L 3.9 -  Chloride 96 - 112 mEq/L 102 -  CO2 19 - 32 mEq/L 27 -  Calcium 8.4 - 10.5 mg/dL 9.4 -    CBC Latest Ref Rng & Units 12/12/2015 07/24/2014 03/15/2014  WBC 4.0 - 10.5 K/uL 7.7 6.6 6.8  Hemoglobin 12.0 - 15.0 g/dL 12.8 12.0 12.2  Hematocrit 36.0 - 46.0 % 38.0 36.9 38  Platelets 150.0 - 400.0 K/uL 278.0 250 283    CXR: NNF  IMPRESSION:   1) Chronic cough. Unclear etiology. Might be multifactorial. Rhinitis changes seen previously are much improved but cough is minimally improved. She seems to respond to Symbicort but she is having trouble with insurance coverage. There is probably a cyclical component 2) Unusual inspiratory sound noted in RUL of uncertain significance  PLAN:  1) continue Symbicort, Flonase, antihistamines/decongestant, proton pump inhibitor for now 2) chest x-ray ordered for today 3) cough suppression with Tussionex daily at bedtime as needed and Tessalon Perles 3 times a day as needed  Follow up in 4-6 weeks   Merton Border, MD PCCM service Mobile [phone] Pager [phone] 11/03/2016

## 2016-11-03 ENCOUNTER — Ambulatory Visit
Admission: RE | Admit: 2016-11-03 | Discharge: 2016-11-03 | Disposition: A | Discharge status: Home / Self Care | Payer: No Typology Code available for payment source | Source: Ambulatory Visit | Attending: Pulmonary Disease | Admitting: Pulmonary Disease

## 2016-11-03 ENCOUNTER — Ambulatory Visit (INDEPENDENT_AMBULATORY_CARE_PROVIDER_SITE_OTHER): Payer: No Typology Code available for payment source | Admitting: Pulmonary Disease

## 2016-11-03 ENCOUNTER — Encounter: Payer: Self-pay | Admitting: Pulmonary Disease

## 2016-11-03 VITALS — BP 132/86 | HR 96 | Wt 272.0 lb

## 2016-11-03 DIAGNOSIS — R05 Cough: Secondary | ICD-10-CM | POA: Insufficient documentation

## 2016-11-03 DIAGNOSIS — R059 Cough, unspecified: Secondary | ICD-10-CM

## 2016-11-03 DIAGNOSIS — R918 Other nonspecific abnormal finding of lung field: Secondary | ICD-10-CM | POA: Diagnosis not present

## 2016-11-03 MED ORDER — BUDESONIDE-FORMOTEROL FUMARATE 160-4.5 MCG/ACT IN AERO: 2.0000 | INHALATION_SPRAY | Freq: Two times a day (BID) | RESPIRATORY_TRACT | 10 refills | Status: DC

## 2016-11-03 MED ORDER — HYDROCOD POLST-CPM POLST ER 10-8 MG/5ML PO SUER
5.0000 mL | Freq: Every evening | ORAL | Status: DC | PRN
Start: 2016-11-03 — End: 2016-12-18

## 2016-11-03 MED ORDER — HYDROCOD POLST-CPM POLST ER 10-8 MG/5ML PO SUER: 5.0000 mL | Freq: Every evening | ORAL | 0 refills | Status: DC | PRN

## 2016-11-03 MED ORDER — BENZONATATE 200 MG PO CAPS: 200.0000 mg | ORAL_CAPSULE | Freq: Three times a day (TID) | ORAL | 10 refills | Status: DC | PRN

## 2016-11-03 NOTE — Patient Instructions (Addendum)
Chest Xray today  Continue Symbicort - prescription sent to pharmacy  Cough suppression with Tussionex as needed @ bedtime and Tessalon Perles as needed during day (up to 3 times per day)  Follow up in 4-6 weeks

## 2016-11-05 ENCOUNTER — Encounter: Payer: Self-pay | Admitting: Family Medicine

## 2016-11-05 ENCOUNTER — Other Ambulatory Visit: Payer: Self-pay | Admitting: Family Medicine

## 2016-11-05 ENCOUNTER — Encounter: Payer: Self-pay | Admitting: Pulmonary Disease

## 2016-12-02 ENCOUNTER — Other Ambulatory Visit: Payer: Self-pay | Admitting: Family Medicine

## 2016-12-02 DIAGNOSIS — Z01419 Encounter for gynecological examination (general) (routine) without abnormal findings: Secondary | ICD-10-CM

## 2016-12-17 ENCOUNTER — Other Ambulatory Visit (INDEPENDENT_AMBULATORY_CARE_PROVIDER_SITE_OTHER): Payer: No Typology Code available for payment source

## 2016-12-17 DIAGNOSIS — E559 Vitamin D deficiency, unspecified: Secondary | ICD-10-CM

## 2016-12-17 DIAGNOSIS — R7989 Other specified abnormal findings of blood chemistry: Secondary | ICD-10-CM | POA: Diagnosis not present

## 2016-12-17 DIAGNOSIS — Z01419 Encounter for gynecological examination (general) (routine) without abnormal findings: Secondary | ICD-10-CM | POA: Diagnosis not present

## 2016-12-17 LAB — LIPID PANEL
CHOL/HDL RATIO: 4
CHOLESTEROL: 182 mg/dL (ref 0–200)
HDL: 45.3 mg/dL (ref >39.00)
NonHDL: 136.42
TRIGLYCERIDES: 234 mg/dL — AB (ref 0.0–149.0)
VLDL: 46.8 mg/dL — ABNORMAL HIGH (ref 0.0–40.0)

## 2016-12-17 LAB — COMPREHENSIVE METABOLIC PANEL
ALBUMIN: 4.1 g/dL (ref 3.5–5.2)
ALK PHOS: 64 U/L (ref 39–117)
ALT: 17 U/L (ref 0–35)
AST: 16 U/L (ref 0–37)
BUN: 13 mg/dL (ref 6–23)
CALCIUM: 9.6 mg/dL (ref 8.4–10.5)
CHLORIDE: 105 meq/L (ref 96–112)
CO2: 27 mEq/L (ref 19–32)
CREATININE: 0.71 mg/dL (ref 0.40–1.20)
GFR: 91.15 mL/min (ref >60.00)
Glucose, Bld: 108 mg/dL — ABNORMAL HIGH (ref 70–99)
Potassium: 4.5 mEq/L (ref 3.5–5.1)
Sodium: 141 mEq/L (ref 135–145)
TOTAL PROTEIN: 7.2 g/dL (ref 6.0–8.3)
Total Bilirubin: 0.3 mg/dL (ref 0.2–1.2)

## 2016-12-17 LAB — CBC WITH DIFFERENTIAL/PLATELET
BASOS ABS: 0.1 10*3/uL (ref 0.0–0.1)
Basophils Relative: 1.1 % (ref 0.0–3.0)
EOS ABS: 0.3 10*3/uL (ref 0.0–0.7)
Eosinophils Relative: 3.5 % (ref 0.0–5.0)
HEMATOCRIT: 38.2 % (ref 36.0–46.0)
HEMOGLOBIN: 12.7 g/dL (ref 12.0–15.0)
LYMPHS PCT: 28.9 % (ref 12.0–46.0)
Lymphs Abs: 2.3 10*3/uL (ref 0.7–4.0)
MCHC: 33.3 g/dL (ref 30.0–36.0)
MCV: 90.1 fl (ref 78.0–100.0)
MONOS PCT: 8.3 % (ref 3.0–12.0)
Monocytes Absolute: 0.7 10*3/uL (ref 0.1–1.0)
NEUTROS ABS: 4.6 10*3/uL (ref 1.4–7.7)
Neutrophils Relative %: 58.2 % (ref 43.0–77.0)
Platelets: 259 10*3/uL (ref 150.0–400.0)
RBC: 4.24 Mil/uL (ref 3.87–5.11)
RDW: 15.3 % (ref 11.5–15.5)
WBC: 7.8 10*3/uL (ref 4.0–10.5)

## 2016-12-17 LAB — LDL CHOLESTEROL, DIRECT: LDL DIRECT: 98 mg/dL

## 2016-12-17 LAB — TSH: TSH: 2.85 u[IU]/mL (ref 0.35–4.50)

## 2016-12-17 LAB — VITAMIN D 25 HYDROXY (VIT D DEFICIENCY, FRACTURES): VITD: 31.59 ng/mL (ref 30.00–100.00)

## 2016-12-18 ENCOUNTER — Ambulatory Visit (INDEPENDENT_AMBULATORY_CARE_PROVIDER_SITE_OTHER): Payer: No Typology Code available for payment source | Admitting: Family Medicine

## 2016-12-18 ENCOUNTER — Encounter: Payer: Self-pay | Admitting: Family Medicine

## 2016-12-18 VITALS — BP 130/74 | HR 84 | Temp 98.2°F | Ht 67.0 in | Wt 274.0 lb

## 2016-12-18 DIAGNOSIS — Z23 Encounter for immunization: Secondary | ICD-10-CM

## 2016-12-18 DIAGNOSIS — R059 Cough, unspecified: Secondary | ICD-10-CM | POA: Insufficient documentation

## 2016-12-18 DIAGNOSIS — Z0000 Encounter for general adult medical examination without abnormal findings: Secondary | ICD-10-CM | POA: Diagnosis not present

## 2016-12-18 DIAGNOSIS — Z79890 Hormone replacement therapy (postmenopausal): Secondary | ICD-10-CM | POA: Diagnosis not present

## 2016-12-18 DIAGNOSIS — R05 Cough: Secondary | ICD-10-CM

## 2016-12-18 DIAGNOSIS — E038 Other specified hypothyroidism: Secondary | ICD-10-CM

## 2016-12-18 DIAGNOSIS — Z6835 Body mass index (BMI) 35.0-35.9, adult: Secondary | ICD-10-CM

## 2016-12-18 DIAGNOSIS — E785 Hyperlipidemia, unspecified: Secondary | ICD-10-CM

## 2016-12-18 MED ORDER — GABAPENTIN 100 MG PO CAPS: ORAL_CAPSULE | ORAL | 3 refills | Status: DC

## 2016-12-18 MED ORDER — HYDROCHLOROTHIAZIDE 12.5 MG PO CAPS: ORAL_CAPSULE | ORAL | 3 refills | Status: DC

## 2016-12-18 MED ORDER — ESTROGENS CONJUGATED 0.625 MG PO TABS: ORAL_TABLET | ORAL | 3 refills | Status: DC

## 2016-12-18 MED ORDER — IBUPROFEN 800 MG PO TABS: 800.0000 mg | ORAL_TABLET | ORAL | 3 refills | Status: DC | PRN

## 2016-12-18 MED ORDER — FLUTICASONE PROPIONATE 50 MCG/ACT NA SUSP: 2.0000 | Freq: Every day | NASAL | 3 refills | Status: DC

## 2016-12-18 MED ORDER — LEVOTHYROXINE SODIUM 25 MCG PO TABS: ORAL_TABLET | ORAL | 3 refills | Status: DC

## 2016-12-18 MED ORDER — FLUTICASONE PROPIONATE 50 MCG/ACT NA SUSP
2.0000 | Freq: Every day | NASAL | 3 refills | Status: DC
Start: 2016-12-18 — End: 2017-09-11

## 2016-12-18 MED ORDER — GEMFIBROZIL 600 MG PO TABS: 600.0000 mg | ORAL_TABLET | Freq: Two times a day (BID) | ORAL | 3 refills | Status: DC

## 2016-12-18 NOTE — Assessment & Plan Note (Signed)
Followed by Pulmonary. Has follow up appt with Dr. Rosita Fire next week. Continue current rxs.

## 2016-12-18 NOTE — Patient Instructions (Signed)
Great to see you. We are referring you to a nutritionist.  Someone from our office or their office will call you with an appointment.

## 2016-12-18 NOTE — Assessment & Plan Note (Signed)
Well controlled with current dose of statin. No changes made to rxs.

## 2016-12-18 NOTE — Assessment & Plan Note (Signed)
Reviewed preventive care protocols, scheduled due services, and updated immunizations Discussed nutrition, exercise, diet, and healthy lifestyle.  

## 2016-12-18 NOTE — Assessment & Plan Note (Signed)
Deteriorated. She is motivated and trying to make positive lifestyle changes. Refer to nutritionist. The patient indicates understanding of these issues and agrees with the plan.

## 2016-12-18 NOTE — Assessment & Plan Note (Signed)
Euthyroid on current dose of synthroid. No changes made to rxs.

## 2016-12-18 NOTE — Assessment & Plan Note (Signed)
Does not have a uterus so does not require progesterone to be added to premarin. She is pleased with current dose of premarin.  Hot flashes have improved significantly.

## 2016-12-18 NOTE — Progress Notes (Signed)
Subjective:    Patient ID: [name], female    DOB: 23-Jan-1961, 56 y.o.   MRN: [id number]  Very pleasant 56 yo female here for CPX.  Cough  Pertinent negatives include no chest pain or shortness of breath.  Being followed by Dr. Rosita Fire.  Has appt next week. Cough has been ongoing since 02/2016. Was last seen by him on 11/03/16- note reviewed.  Advised to continue PPI, symbicort, Zyrtec D.  Also given tussionex and tessalon whic have helped some.   Remote h/o hysterectomy and s/p resection of cervix and bladder sling on 07/24/14 Nori Riis).  Mammogram 06/09/16 Colonoscopy 04/25/14  Obesity- she is frustrated with her weight.  Has been walking daily at work.  She is cutting back on fried foods.  Feels like she is not sure what else to do to lose weight.  Wt Readings from Last 3 Encounters:  12/18/16 274 lb (124.3 kg)  11/03/16 272 lb (123.4 kg)  09/12/16 273 lb (123.8 kg)    On HRT for hot flashes- premarin.  Prefers this to the patch she was on previously.   HLD- on Zocor and Lopid. Cholesterol much improved! Lab Results  Component Value Date   CHOL 182 12/17/2016   HDL 45.30 12/17/2016   LDLCALC 97 12/12/2015   LDLDIRECT 98.0 12/17/2016   TRIG 234.0 (H) 12/17/2016   CHOLHDL 4 12/17/2016    Lab Results  Component Value Date   NA 141 12/17/2016   K 4.5 12/17/2016   CL 105 12/17/2016   CO2 27 12/17/2016   Lab Results  Component Value Date   WBC 7.8 12/17/2016   HGB 12.7 12/17/2016   HCT 38.2 12/17/2016   MCV 90.1 12/17/2016   PLT 259.0 12/17/2016   Lab Results  Component Value Date   TSH 2.85 12/17/2016   Lab Results  Component Value Date   ALT 17 12/17/2016   AST 16 12/17/2016   ALKPHOS 64 12/17/2016   BILITOT 0.3 12/17/2016    Patient Active Problem List   Diagnosis Date Noted  . Cough 12/18/2016  . Shingles 05/09/2016  . Postmenopausal HRT (hormone replacement therapy) 12/17/2015  . Hypothyroidism 12/17/2015  . Cervical prolapse 07/24/2014  .  Neuropathy 03/24/2014  . Female bladder prolapse, acquired 03/24/2014  . 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 Elevated TSH 05/09/2013  . Eosinophil count raised 05/09/2013  . Menopausal syndrome (hot flashes) 03/04/2013  . Elevated hemoglobin A1c measurement 11/11/2012  . Obesity 03/31/2011  . Routine general medical examination at a health care facility 03/31/2011  . Hyperlipidemia   . Anxiety   . Vitamin D deficiency    Past Medical History:  Diagnosis Date  . Anxiety   . History of colon polyps   . History of eosinophilia    ESOPHAGEAL  . Hyperlipidemia   . Hypothyroidism   . Migraine   . Nocturia   . Pelvic prolapse   . Seasonal allergies   . Urgency of urination   . Vitamin D deficiency   . Wears glasses    Past Surgical History:  Procedure Laterality Date  . ANTERIOR AND POSTERIOR REPAIR WITH SACROSPINOUS FIXATION N/A 07/24/2014   Procedure: SACROSPINOUS LIGAMENT SUSPENSION ;  Surgeon: Maisie Fus, MD;  Location: [facility];  Service: Gynecology;  Laterality: N/A;  . COLONOSCOPY W/ POLYPECTOMY  04-25-2014  . LAPAROSCOPIC CHOLECYSTECTOMY  2008  . SUPRACERVICAL ABDOMINAL HYSTERECTOMY  2001   w/  Bladder Suspension  . VAGINAL HYSTERECTOMY N/A 07/24/2014   Procedure: RESECTION OF  CERVIX ;  Surgeon: Maisie Fus, MD;  Location: [facility];  Service: Gynecology;  Laterality: N/A;   Social History  Substance Use Topics  . Smoking status: Former Smoker    Packs/day: 1.00    Years: 1.00    Types: Cigarettes    Quit date: 07/19/1982  . Smokeless tobacco: Never Used  . Alcohol use No   Family History  Problem Relation Age of Onset  . Hyperlipidemia Mother   . Colon cancer Neg Hx    Allergies  Allergen Reactions  . Codeine Nausea And Vomiting   Current Outpatient Prescriptions on File Prior to Visit  Medication Sig Dispense Refill  . benzonatate (TESSALON) 200 MG capsule Take 1 capsule (200 mg total) by mouth 3 (three) times daily as needed  for cough. 90 capsule 10  . budesonide-formoterol (SYMBICORT) 160-4.5 MCG/ACT inhaler Inhale 2 puffs into the lungs 2 (two) times daily. 1 Inhaler 10  . butalbital-acetaminophen-caffeine (FIORICET) 50-325-40 MG tablet Take 1 tablet by mouth as needed. 30 tablet 0  . chlorpheniramine-HYDROcodone (TUSSIONEX PENNKINETIC ER) 10-8 MG/5ML SUER Take 5 mLs by mouth at bedtime as needed for cough. 140 mL 0  . Cholecalciferol (VITAMIN D3) 2000 UNITS TABS Take 6,000 Units by mouth daily. Reported on 10/29/2015    . fluticasone (FLONASE) 50 MCG/ACT nasal spray Place 2 sprays into both nostrils daily. 16 g 2  . gabapentin (NEURONTIN) 100 MG capsule TAKE 4 CAPSULES (400 MG TOTAL) BY MOUTH AT BEDTIME. 360 capsule 0  . gemfibrozil (LOPID) 600 MG tablet Take 1 tablet (600 mg total) by mouth 2 (two) times daily before a meal. 60 tablet 3  . hydrochlorothiazide (MICROZIDE) 12.5 MG capsule TAKE 1 CAPSULE BY MOUTH DAILY AS NEEDED FOR LOWER EXTREMITY EDEMA. 30 capsule 3  . ibuprofen (ADVIL,MOTRIN) 800 MG tablet Take 1 tablet (800 mg total) by mouth as needed. 30 tablet 3  . levothyroxine (SYNTHROID, LEVOTHROID) 25 MCG tablet TAKE ONE TABLET DAILY BEFORE BREAKFAST. 90 tablet 2  . Multiple Vitamin (MULTIVITAMIN) capsule Take 1 capsule by mouth daily.    Marland Kitchen omeprazole (PRILOSEC) 40 MG capsule Take 1 capsule (40 mg total) by mouth at bedtime. 30 capsule 10  . PREMARIN 0.625 MG tablet TAKE 1 TABLET (0.625 MG TOTAL) BY MOUTH DAILY. 90 tablet 2  . SUMAtriptan (IMITREX) 20 MG/ACT nasal spray Place 1 spray (20 mg total) into the nose as needed. 24 Inhaler 0  . VENTOLIN HFA 108 (90 Base) MCG/ACT inhaler INHALE 1-2 PUFFS EVERY 6 HOURS AS NEEDED FOR WHEEZING OR SHORTNESS BREATH 18 each 7   No current facility-administered medications on file prior to visit.    The PMH, PSH, Social History, Family History, Medications, and allergies have been reviewed in [facility], and have been updated if relevant.   Review of Systems  Constitutional:  Negative.   HENT: Negative.   Eyes: Negative.   Respiratory: Positive for cough. Negative for apnea, chest tightness and shortness of breath.   Cardiovascular: Negative for chest pain, palpitations and leg swelling.  Gastrointestinal: Negative for abdominal distention, abdominal pain, blood in stool, constipation and diarrhea.  Endocrine: Negative for cold intolerance and heat intolerance.  Genitourinary: Negative for difficulty urinating, dysuria and flank pain.  Musculoskeletal: Negative for arthralgias and gait problem.  Hematological: Negative.   Psychiatric/Behavioral: Negative.   All other systems reviewed and are negative.      Objective:   Physical Exam BP 130/74 (BP Location: Left Arm, Patient Position: Sitting, Cuff Size: Large)  Pulse 84   Temp 98.2 F (36.8 C) (Oral)   Ht 5\' 7"  (1.702 m)   Wt 274 lb (124.3 kg)   SpO2 98%   BMI 42.91 kg/m  Wt Readings from Last 3 Encounters:  12/18/16 274 lb (124.3 kg)  11/03/16 272 lb (123.4 kg)  09/12/16 273 lb (123.8 kg)     General:  Obese, Well-developed,well-nourished,in no acute distress; alert,appropriate and cooperative throughout examination Head:  normocephalic and atraumatic.   Eyes:  vision grossly intact, PERRL Ears:  R ear normal and L ear normal externally, TMs clear bilaterally Nose:  no external deformity.   Mouth:  good dentition.   Neck:  No deformities, masses, or tenderness noted. Breasts:  No mass, nodules, thickening, tenderness, bulging, retraction, inflamation, nipple discharge or skin changes noted.   Lungs:  Normal respiratory effort, chest expands symmetrically. Lungs are clear to auscultation, no crackles or wheezes. Heart:  Normal rate and regular rhythm. S1 and S2 normal without gallop, murmur, click, rub or other extra sounds. Abdomen:  Bowel sounds positive,abdomen soft and non-tender without masses, organomegaly or hernias noted. Msk:  No deformity or scoliosis noted of thoracic or lumbar  spine.   Extremities:  No clubbing, cyanosis, edema, or deformity noted with normal full range of motion of all joints.   Neurologic:  alert & oriented X3 and gait normal.   Skin:  Intact without suspicious lesions or rashes Cervical Nodes:  No lymphadenopathy noted Axillary Nodes:  No palpable lymphadenopathy Psych:  Cognition and judgment appear intact. Alert and cooperative with normal attention span and concentration. No apparent delusions, illusions, hallucinations        Assessment & Plan:

## 2016-12-18 NOTE — Progress Notes (Signed)
Pre visit review using our clinic review tool, if applicable. No additional management support is needed unless otherwise documented below in the visit note. 

## 2016-12-19 ENCOUNTER — Telehealth: Payer: Self-pay | Admitting: Pulmonary Disease

## 2016-12-19 MED ORDER — OMEPRAZOLE 40 MG PO CPDR
40.0000 mg | DELAYED_RELEASE_CAPSULE | Freq: Every day | ORAL | 5 refills | Status: DC
Start: 2016-12-19 — End: 2017-01-28

## 2016-12-19 NOTE — Telephone Encounter (Signed)
°*  STAT* If patient is at the pharmacy, call can be transferred to refill team.   1. Which medications need to be refilled? (please list name of each medication and dose if known)  Inhaler and Budesonide   2. Which pharmacy/location (including street and city if local pharmacy) is medication to be sent to? [facility] in [place]   3. Do they need a 30 day or 90 day supply?  30 day    She was going to come in next week but had to reschedule her appointment.  She is having to pick her daughter up from college.  She is on wait list to come in sooner but would need a refill before then Please advise

## 2016-12-19 NOTE — Telephone Encounter (Signed)
Message unclear patient has refills at pharmacy of Symbicort. Left message for patient to call back.

## 2016-12-19 NOTE — Telephone Encounter (Signed)
Omeprazole refilled.[place]

## 2016-12-24 ENCOUNTER — Encounter: Payer: Self-pay | Admitting: Dietician

## 2016-12-24 ENCOUNTER — Encounter: Payer: No Typology Code available for payment source | Attending: Family Medicine | Admitting: Dietician

## 2016-12-24 DIAGNOSIS — Z6835 Body mass index (BMI) 35.0-35.9, adult: Secondary | ICD-10-CM | POA: Diagnosis not present

## 2016-12-24 DIAGNOSIS — Z713 Dietary counseling and surveillance: Secondary | ICD-10-CM | POA: Insufficient documentation

## 2016-12-24 DIAGNOSIS — E7849 Other hyperlipidemia: Secondary | ICD-10-CM

## 2016-12-24 NOTE — Progress Notes (Signed)
Medical Nutrition Therapy: Visit start time: 8850  end time: 2774  Assessment:  Diagnosis: obesity, HLD  Past medical history: Vit D deficiency, neuropathy, hypothyroidism, see chart  Psychosocial issues/ stress concerns: none  Preferred learning method:  Public house manager . Hands-on  Current weight: 275.4lb  Height: 5\' 7"  Medications, supplements: MVI, Synthroid, Vit D3, see chart for full list  Progress and evaluation: Patient's main interest in management of current conditions is weight reduction. States ultimate goal of of 175# which she has not been at since high school. Reports lack of basic nutrition knowledge and desires to learn how to eat w/o having to go on a "diet." H/O dieting with no success. Barriers to weight reduction include perceived lack of time, inconsistent daily schedule, lack of knowledge on how to cook quick nutritious meals and how to build a balanced meal. Interventions currently in place include less evening snacking, choosing less red meats, not keeping snack foods in the house. Reports consuming all regular fat dairy products and condiments/ dressings.   Physical activity: 3 days a week, 15 minutes at work with coworkers.  Dietary Intake:  Usual eating pattern includes 3 meals and 0-1 snacks per day. Dining out frequency: 2-3 meals per week.  Breakfast: bagel & cream cheese or english muffin, oatmeal Snack: occasionally; dry honey nut cheerios, oatmeal cookie, watermelon or other fruit Lunch: goes out 1 day a week to [employer] (gets fried chicken), sandwich, cheese & crackers or leftovers like pizza if at work Snack: n/a Supper: hot dogs, frozen pizza, baked chicken + steamable vegetables, grilled pork chops+ steamable vegetables. Goes out to eat on weekends (harrisons & gets grilled chicken skewers + pita bread + salad) or [employer] Snack: occasionally popcorn, used to have ice cream but not "in a while" Beverages: sweet tea 1x/wk, water, coffee with  creamer  Nutrition Care Education: Topics covered: portion sizes for all food groups, building a balanced meal, fat vs. CHO vs. Protein, quick and easy meal suggestions, importance of exercise and eating adequate calories as they relate to weight loss, snacking, low sodium eating Basic nutrition: basic food groups, appropriate nutrient balance, appropriate meal and snack schedule, general nutrition guidelines    Weight control: benefits of weight control, behavioral changes for weight loss Advanced nutrition:  cooking techniques, dining out, food label reading Hyperlipidemia:  target goals for lipids, healthy and unhealthy fats, role of fiber Other lifestyle changes:  benefits of making changes, increasing motivation, readiness for change, identifying habits that need to change  Nutritional Diagnosis:  NB-1.1 Food and nutrition-related knowledge deficit As related to lack of basic nutrition knowledge.  As evidenced by inability to differentiate between dietary carbohydrates, protein and fats. Perry-3.3 Overweight/obesity As related to lifestyle habits.  As evidenced by BMI 43.1.  Intervention: Discussion as noted above. Patient was given goals to work on until [employer] notifies her about coverage for visits. Wishes to come back if covered.   Education Materials given:  . High Triglyceride Nutrition Therapy Packet . Good Fats & Bad Fats handout . Food lists/ Planning A Balanced Meal . Goals/ instructions  Learner/ who was taught:  . Patient   Level of understanding: Marland Kitchen Verbalizes/ demonstrates competency  Demonstrated degree of understanding via:   Teach back Learning barriers: . None  Willingness to learn/ readiness for change: . Acceptance, ready for change  Monitoring and Evaluation:  Dietary intake, exercise, and body weight      follow up: when insurance company notifies patient

## 2016-12-24 NOTE — Patient Instructions (Addendum)
-   Once house renovations are complete, start incorporating daily (or at least 5 days/week) exercise. Work up to 5-7 days/wk of 60 minutes of activity. Progress slowly!  - Practice building a balanced plate at meal times. Include as many food  Groups as you can!   - Remember to add protein and healthy carbohydrates at snacks  - Try to incorporate lower salt snacks, and try recipes with low salt content. Use salt-free seasonings like Mrs. Deliah Boston

## 2016-12-25 ENCOUNTER — Ambulatory Visit: Payer: No Typology Code available for payment source | Admitting: Pulmonary Disease

## 2017-01-28 ENCOUNTER — Ambulatory Visit (INDEPENDENT_AMBULATORY_CARE_PROVIDER_SITE_OTHER): Payer: No Typology Code available for payment source | Admitting: Pulmonary Disease

## 2017-01-28 ENCOUNTER — Encounter: Payer: Self-pay | Admitting: Pulmonary Disease

## 2017-01-28 VITALS — BP 128/62 | HR 92 | Ht 67.0 in | Wt 276.0 lb

## 2017-01-28 DIAGNOSIS — R05 Cough: Secondary | ICD-10-CM | POA: Diagnosis not present

## 2017-01-28 DIAGNOSIS — J449 Chronic obstructive pulmonary disease, unspecified: Secondary | ICD-10-CM

## 2017-01-28 DIAGNOSIS — R053 Chronic cough: Secondary | ICD-10-CM

## 2017-01-28 DIAGNOSIS — K219 Gastro-esophageal reflux disease without esophagitis: Secondary | ICD-10-CM

## 2017-01-28 MED ORDER — BUDESONIDE-FORMOTEROL FUMARATE 160-4.5 MCG/ACT IN AERO: 2.0000 | INHALATION_SPRAY | Freq: Two times a day (BID) | RESPIRATORY_TRACT | 10 refills | Status: DC

## 2017-01-28 MED ORDER — OMEPRAZOLE 40 MG PO CPDR: 40.0000 mg | DELAYED_RELEASE_CAPSULE | Freq: Every day | ORAL | 10 refills | Status: DC

## 2017-01-28 NOTE — Progress Notes (Signed)
PULMONARY OFFICE FOLLOW UP NOTE  Requesting MD/Service: Deborra Medina Date of initial consultation: 09/12/16 Reason for consultation: cough  PT PROFILE:  53 F initially evaluated for disabling cough of several months duration. She initially suffered a "bad cold" in 07/17 with head congestion, nasal congestion and bilateral ear fullness. She was treated with prednisone and multiple rounds of antibiotics which seemingly helped transiently. She was on an antihistamine/decongestant with improvement in her symptoms but was no longer taking it. She was on Pulmicort DPI at the time of initial evaluation. Her initial exam revealed severe bilateral rhinitis.  Initial plan: 1) Change Pulmicort to Qvar inhaler (this one is not a dry powder inhaler, 2) Resume Flonase, 3) Resume antihistamine/decongestant, 4) Begin omeprazole 40 mg daily at bedtime  DATA: Office spirometry 09/12/16: no obstruction CXR 11/03/16: mild bronchitic changes   SUBJ: This is a routine reevaluation for chronic cough. She reports that she is approximately 80% improved. She believes the Symbicort has been beneficial. She remains on omeprazole, cetirizine, Flonase nasal inhaler. She has scant mucus, mostly in the mornings. She has minimal exertional dyspnea. She denies hemoptysis and chest pain. She has no lower extremity edema or calf tenderness.   Vitals:   01/28/17 1048  BP: 128/62  Pulse: 92  SpO2: 98%  Weight: 276 lb (125.2 kg)  Height: 5\' 7"  (1.702 m)     EXAM:  Gen: Obese, NAD HEENT: NCAT, sclerae white, oropharynx normal Neck: No JVD Lungs: breath sounds full without adventitious sounds Cardiovascular: RRR, no murmurs noted Abdomen: Soft, nontender, normal BS Ext: without clubbing, cyanosis, edema Neuro: grossly intact  DATA:   BMP Latest Ref Rng & Units 12/17/2016 12/12/2015 03/15/2014  Glucose 70 - 99 mg/dL 108(H) 105(H) -  BUN 6 - 23 mg/dL 13 14 -  Creatinine 0.40 - 1.20 mg/dL 0.71 0.69 0.7  Sodium 135 - 145 mEq/L  141 137 -  Potassium 3.5 - 5.1 mEq/L 4.5 3.9 -  Chloride 96 - 112 mEq/L 105 102 -  CO2 19 - 32 mEq/L 27 27 -  Calcium 8.4 - 10.5 mg/dL 9.6 9.4 -    CBC Latest Ref Rng & Units 12/17/2016 12/12/2015 07/24/2014  WBC 4.0 - 10.5 K/uL 7.8 7.7 6.6  Hemoglobin 12.0 - 15.0 g/dL 12.7 12.8 12.0  Hematocrit 36.0 - 46.0 % 38.2 38.0 36.9  Platelets 150.0 - 400.0 K/uL 259.0 278.0 250    CXR: NNF  IMPRESSION:   Chronic cough - Likely multifactorial. Seems to have benefited with the addition of Symbicort inhaler. I suspect that there is also a component of GERD/LPR. Overall, substantially improved (but not resolved) on the regimen as outlined above  PLAN:  1) continue Symbicort, omeprazole, Flonase, cetirizine 2) if she wishes to try off of these medications, I recommended that she stop one at a time, no more than a single change every 3-4 weeks, assess response and resume that medication if cough relapses. I think the Symbicort and omeprazole should be continued for now. I suggested that she might try off of Flonase first and then consider trial off of cetirizine  Follow-up as needed for any pulmonary, respiratory or chest problems  Merton Border, MD PCCM service Mobile [phone] Pager [phone] 01/28/2017 2:52 PM

## 2017-01-28 NOTE — Patient Instructions (Signed)
Thing that seems to have made the biggest difference with her cough is Symbicort I suspect that omeprazole is the second most important medication  Continue Symbicort and omeprazole as prescribed  You may try off of Flonase and remain off if symptoms do not worsen  After trial off of Flonase, you may try off of Zyrtec as we discussed  Both Flonase and Zyrtec can be used as needed  Follow-up with [place] as needed for any breathing, chest or cough problems

## 2017-03-03 ENCOUNTER — Ambulatory Visit: Payer: Self-pay | Admitting: Registered Nurse

## 2017-03-03 VITALS — BP 126/93 | HR 76 | Temp 98.9°F

## 2017-03-03 DIAGNOSIS — J019 Acute sinusitis, unspecified: Secondary | ICD-10-CM

## 2017-03-03 DIAGNOSIS — H6593 Unspecified nonsuppurative otitis media, bilateral: Secondary | ICD-10-CM

## 2017-03-03 MED ORDER — MONTELUKAST SODIUM 10 MG PO TABS: 10.0000 mg | ORAL_TABLET | Freq: Every day | ORAL | 3 refills | Status: DC

## 2017-03-03 MED ORDER — CETIRIZINE HCL 10 MG PO TABS: 10.0000 mg | ORAL_TABLET | Freq: Every day | ORAL | 11 refills | Status: DC

## 2017-03-03 MED ORDER — PREDNISONE 10 MG (21) PO TBPK: ORAL_TABLET | ORAL | 0 refills | Status: DC

## 2017-03-03 MED ORDER — SALINE SPRAY 0.65 % NA SOLN: 2.0000 | NASAL | 0 refills | Status: DC

## 2017-03-03 NOTE — Progress Notes (Signed)
Subjective:    Patient ID: [name], female    DOB: [birth year], 56 y.o.   MRN: [id number]  56y/o caucasian female established patient reports bil otalgia, L>R x3-4 days. Maxillary sinus pain/pressure, upper teeth pain, HA x3-4 days "doesn't feel like my usual migraine". Productive cough clear. Cough has been present over 1 year. Following with pulmonology.on inhalers and antihistamines has not tried singulair or steroids oral.  Had stopped flonase per pulmonology and recently restarted again with nasal saline. Gets worse with illness. Denies known fever, ear drainage.  Be well labs completed at [facility] in May and followed up for results review with provider 12/18/16 and dietitian 12/24/2016      Review of Systems  Constitutional: Negative for activity change, appetite change, chills, diaphoresis, fatigue, fever and unexpected weight change.  HENT: Positive for congestion, ear pain, postnasal drip, rhinorrhea, sinus pain, sinus pressure and voice change. Negative for dental problem, drooling, ear discharge, facial swelling, hearing loss, mouth sores, nosebleeds, sneezing, sore throat, tinnitus and trouble swallowing.   Eyes: Negative for photophobia, pain, discharge, redness, itching and visual disturbance.  Respiratory: Positive for cough. Negative for choking, chest tightness, shortness of breath, wheezing and stridor.   Cardiovascular: Negative for chest pain, palpitations and leg swelling.  Gastrointestinal: Negative for abdominal distention, abdominal pain, blood in stool, constipation, diarrhea, nausea and vomiting.  Endocrine: Negative for cold intolerance and heat intolerance.  Genitourinary: Negative for difficulty urinating, dysuria and hematuria.  Musculoskeletal: Negative for arthralgias, back pain, gait problem, joint swelling, myalgias, neck pain and neck stiffness.  Skin: Negative for color change, pallor, rash and wound.  Allergic/Immunologic: Positive for environmental allergies.  Negative for food allergies.  Neurological: Positive for headaches. Negative for dizziness, tremors, seizures, syncope, facial asymmetry, speech difficulty, weakness, light-headedness and numbness.  Hematological: Negative for adenopathy. Does not bruise/bleed easily.  Psychiatric/Behavioral: Negative for agitation, behavioral problems, confusion and sleep disturbance.       Objective:   Physical Exam  Constitutional: She is oriented to person, place, and time. She appears well-developed and well-nourished. She is active and cooperative.  Non-toxic appearance. She does not have a sickly appearance. She appears ill. No distress.  HENT:  Head: Normocephalic and atraumatic.  Right Ear: Hearing, external ear and ear canal normal. A middle ear effusion is present.  Left Ear: Hearing, external ear and ear canal normal. A middle ear effusion is present.  Nose: Mucosal edema and rhinorrhea present. No nose lacerations, sinus tenderness, nasal deformity, septal deviation or nasal septal hematoma. No epistaxis.  No foreign bodies. Right sinus exhibits maxillary sinus tenderness and frontal sinus tenderness. Left sinus exhibits maxillary sinus tenderness and frontal sinus tenderness.  Mouth/Throat: Uvula is midline and mucous membranes are normal. Mucous membranes are not pale, not dry and not cyanotic. She does not have dentures. No oral lesions. No trismus in the jaw. Normal dentition. No dental abscesses, uvula swelling, lacerations or dental caries. Posterior oropharyngeal edema and posterior oropharyngeal erythema present. No oropharyngeal exudate or tonsillar abscesses.  Bilateral allergic shiners; cobblestoning posterior pharynx; bilateral TMs air fluid level clear; nasally voice; cough not observed in exam room; bilateral nasal turbinates edema/erythema clear discharge  Eyes: Pupils are equal, round, and reactive to light. Conjunctivae, EOM and lids are normal. Right eye exhibits no chemosis, no  discharge, no exudate and no hordeolum. No foreign body present in the right eye. Left eye exhibits no chemosis, no discharge, no exudate and no hordeolum. No foreign body present in the left  eye. Right conjunctiva is not injected. Right conjunctiva has no hemorrhage. Left conjunctiva is not injected. Left conjunctiva has no hemorrhage. No scleral icterus. Right eye exhibits normal extraocular motion and no nystagmus. Left eye exhibits normal extraocular motion and no nystagmus. Right pupil is round and reactive. Left pupil is round and reactive. Pupils are equal.  Neck: Trachea normal, normal range of motion and phonation normal. Neck supple. No tracheal tenderness, no spinous process tenderness and no muscular tenderness present. No neck rigidity. No tracheal deviation, no edema, no erythema and normal range of motion present. No thyroid mass and no thyromegaly present.  Cardiovascular: Normal rate, regular rhythm, S1 normal, S2 normal, normal heart sounds and intact distal pulses.  PMI is not displaced.  Exam reveals no gallop and no friction rub.   No murmur heard. Pulmonary/Chest: Effort normal and breath sounds normal. No accessory muscle usage or stridor. No respiratory distress. She has no decreased breath sounds. She has no wheezes. She has no rhonchi. She has no rales. She exhibits no tenderness.  Speaks full sentences without difficulty  Abdominal: Soft. She exhibits no distension.  Musculoskeletal: Normal range of motion. She exhibits no edema or tenderness.       Right shoulder: Normal.       Left shoulder: Normal.       Right hip: Normal.       Left hip: Normal.       Right knee: Normal.       Left knee: Normal.       Cervical back: Normal.       Right hand: Normal.       Left hand: Normal.  Lymphadenopathy:       Head (right side): No submental, no submandibular, no tonsillar, no preauricular, no posterior auricular and no occipital adenopathy present.       Head (left side): No  submental, no submandibular, no tonsillar, no preauricular, no posterior auricular and no occipital adenopathy present.    She has no cervical adenopathy.       Right cervical: No superficial cervical, no deep cervical and no posterior cervical adenopathy present.      Left cervical: No superficial cervical, no deep cervical and no posterior cervical adenopathy present.  Neurological: She is alert and oriented to person, place, and time. She has normal strength. She is not disoriented. She displays no atrophy and no tremor. No cranial nerve deficit or sensory deficit. She exhibits normal muscle tone. She displays no seizure activity. Coordination and gait normal. GCS eye subscore is 4. GCS verbal subscore is 5. GCS motor subscore is 6.  On off exam table and in/out chair without difficulty gait sure and stable in exam room  Skin: Skin is warm, dry and intact. No abrasion, no bruising, no burn, no ecchymosis, no laceration, no lesion, no petechiae and no rash noted. She is not diaphoretic. No cyanosis or erythema. No pallor. Nails show no clubbing.  Psychiatric: She has a normal mood and affect. Her speech is normal and behavior is normal. Judgment and thought content normal. Cognition and memory are normal.  Nursing note and vitals reviewed.         Assessment & Plan:  A-acute rhinosinusitis and bilateral otitis media effusion  P-Trial prednisone taper 10mg  (60/50/40/30/20/10mg ) po daily with breakfast #21 RF0 dispensed from [facility].  Singulair 10mg  po qhs #30 RF3 pick up at [facility] of choice.  Cough lozenges po q2h prn cough.  Hydrate  Given 8 UD honey  with lemon cough drops and 1 bottle nasal saline spray. No evidence of systemic bacterial infection, non toxic and well hydrated.  I do not see where any further testing or imaging is necessary at this time.   I will suggest supportive care, rest, good hygiene and encourage the patient to take adequate fluids.  The patient is to return to  clinic or EMERGENCY ROOM if symptoms worsen or change significantly.  Exitcare handout on sinusitis given to patient.  Patient verbalized agreement and understanding of treatment plan and had no further questions at this time.   P2:  Hand washing and cover cough  Start trial singulair 10mg  po daily #30 RF3 electronic Rx to pharmacy of choice.  Patient may use normal saline nasal spray as needed.  Continue cetirizine 10mg  po daily and flonase 1 spray each nostril BID at home.  Avoid triggers if possible.  Shower prior to bedtime if exposed to triggers.  If allergic dust/dust mites recommend mattress/pillow covers/encasements; washing linens, vacuuming, sweeping, dusting weekly.  Call or return to clinic as needed if these symptoms worsen or fail to improve as anticipated.   Exitcare handout on allergic rhinitis given to patient.  Patient verbalized understanding of instructions, agreed with plan of care and had no further questions at this time.  P2:  Avoidance and hand washing.  Supportive treatment.   No evidence of invasive bacterial infection, non toxic and well hydrated.  This is most likely self limiting viral infection.  I do not see where any further testing or imaging is necessary at this time.   I will suggest supportive care, rest, good hygiene and encourage the patient to take adequate fluids.  The patient is to return to clinic or EMERGENCY ROOM if symptoms worsen or change significantly e.g. ear pain, fever, purulent discharge from ears or bleeding.  Exitcare handout on otitis media with effusion given to patient.  Patient verbalized agreement and understanding of treatment plan.     If no relief with singulair, flonase, saline, shower prior to bedtime and prednisone taper will start doxycycline 100mg  po BID x 10 days #20 RF0 discussed wear sunscreen as increased risk sunburn and take with food.  Discussed possible side effects of prednisone e.g. Insomnia, elevated heart rate/BP,  increase/decrease appetite.  Hypokalemia and foods rich in potassium.  Continue inhaler use as per instructions from [facility].  Bronchitis simple, allergic probably but could be viral (respiratory syncytial, parainfluenza, influenza, or adenovirus), but now evidence of acute purulent bronchitis with resultant bronchial edema and mucus formation.  Viruses are the most common cause of bronchial inflammation in otherwise healthy adults with acute bronchitis.  The appearance of sputum is not predictive of whether a bacterial infection is present.  Purulent sputum is most often caused by viral infections.  There are a small portion of those caused by non-viral agents being Mycoplamsa pneumonia.  Microscopic examination or C&S of sputum in the healthy adult with acute bronchitis is generally not helpful (usually negative or normal respiratory flora) other considerations being cough from upper respiratory tract infections, sinusitis or allergic syndromes (mild asthma or viral pneumonia).  Differential Diagnosis:  reactive airway disease (asthma, allergic aspergillosis (eosinophilia), chronic bronchitis, respiratory infection (Sinusitis, Common cold, pneumonia), congestive heart failure, reflux esophagitis, bronchogenic tumor, aspiration syndromes and/or exposure irritants/tobacco smoke.  In this case, there is no evidence of any invasive bacterial illness.  Most likely viral etiology so will hold on antibiotic treatment.  Advise supportive care with rest, encourage fluids, good hygiene  and watch for any worsening symptoms.  If they were to develop:  come back to the office or go to the emergency room if after hours.  Without high fever, severe dyspnea, lack of physical findings or other risk factors, I will hold on a chest radiograph and CBC at this time. I discussed that approximately 50% of patients with acute bronchitis have a cough that lasts up to three weeks, and 25% for over a month.  Tylenol, one to two tablets every  four hours as needed for fever or myalgias.   No aspirin.  Patient instructed to follow up in one week or sooner if symptoms worsen. Patient verbalized agreement and understanding of treatment plan.  P2:  hand washing and cover cough

## 2017-03-03 NOTE — Patient Instructions (Signed)
Otitis Media With Effusion, Pediatric Otitis media with effusion (OME) occurs when there is inflammation of the middle ear and fluid in the middle ear space. There are no signs and symptoms of infection. The middle ear space contains air and the bones for hearing. Air in the middle ear space helps to transmit sound to the brain. OME is a common condition in children, and it often occurs after an ear infection. This condition may be present for several weeks or longer after an ear infection. Most cases of this condition get better on their own. What are the causes? OME is caused by a blockage of the eustachian tube in one or both ears. These tubes drain fluid in the ears to the back of the nose (nasopharynx). If the tissue in the tube swells up (edema), the tube closes. This prevents fluid from draining. Blockage can be caused by:  Ear infections.  Colds and other upper respiratory infections.  Allergies.  Irritants, such as tobacco smoke.  Enlarged adenoids. The adenoids are areas of soft tissue located high in the back of the throat, behind the nose and the roof of the mouth. They are part of the body's natural defense (immune) system.  A mass in the nasopharynx.  Damage to the ear caused by pressure changes (barotrauma).  What increases the risk? Your child is more likely to develop this condition if:  He or she has repeated ear and sinus infections.  He or she has allergies.  He or she is exposed to tobacco smoke.  He or she attends daycare.  He or she is not breastfed.  What are the signs or symptoms? Symptoms of this condition may not be obvious. Sometimes this condition does not have any symptoms, or symptoms may overlap with those of a cold or upper respiratory tract illness. Symptoms of this condition include:  Temporary hearing loss.  A feeling of fullness in the ear without pain.  Irritability or agitation.  Balance (vestibular) problems.  As a result of hearing  loss, your child may:  Listen to the TV at a loud volume.  Not respond to questions.  Ask "What?" often when spoken to.  Mistake or confuse one sound or word for another.  Perform poorly at school.  Have a poor attention span.  Become agitated or irritated easily.  How is this diagnosed? This condition is diagnosed with an ear exam. Your child's health care provider will look inside your child's ear with an instrument (otoscope) to check for redness, swelling, and fluid. Other tests may be done, including:  A test to check the movement of the eardrum (pneumatic otoscopy). This is done by squeezing a small amount of air into the ear.  A test that changes air pressure in the middle ear to check how well the eardrum moves and to see if the eustachian tube is working (tympanogram).  Hearing test (audiogram). This test involves playing tones at different pitches to see if your child can hear each tone.  How is this treated? Treatment for this condition depends on the cause. In many cases, the fluid goes away on its own. In some cases, your child may need a procedure to create a hole in the eardrum to allow fluid to drain (myringotomy) and to insert small drainage tubes (tympanostomy tubes) into the eardrums. These tubes help to drain fluid and prevent infection. This procedure may be recommended if:  OME does not get better over several months.  Your child has many ear  infections within several months.  Your child has noticeable hearing loss.  Your child has problems with speech and language development.  Surgery may also be done to remove the adenoids (adenoidectomy). Follow these instructions at home:  Give over-the-counter and prescription medicines only as told by your child's health care provider.  Keep children away from any tobacco smoke.  Keep all follow-up visits as told by your child's health care provider. This is important. How is this prevented?  Keep your  child's vaccinations up to date. Make sure your child gets all recommended vaccinations, including a pneumonia and flu vaccine.  Encourage hand washing. Your child should wash his or her hands often with soap and water. If there is no soap and water, he or she should use hand sanitizer.  Avoid exposing your child to tobacco smoke.  Breastfeed your baby, if possible. Babies who are breastfed as long as possible are less likely to develop this condition. Contact a health care provider if:  Your child's hearing does not get better after 3 months.  Your child's hearing is worse.  Your child has ear pain.  Your child has a fever.  Your child has drainage from the ear.  Your child is dizzy.  Your child has a lump on his or her neck. Get help right away if:  Your child has bleeding from the nose.  Your child cannot move part of her or his face.  Your child has trouble breathing.  Your child cannot smell.  Your child develops severe congestion.  Your child develops weakness.  Your child who is younger than 3 months has a temperature of 100F (38C) or higher. Summary  Otitis media with effusion (OME) occurs when there is inflammation of the middle ear and fluid in the middle ear space.  This condition is caused by blockage of one or both eustachian tubes, which drain fluid in the ears to the back of the nose.  Symptoms of this condition can include temporary hearing loss, a feeling of fullness in the ear, irritability or agitation, and balance (vestibular) problems. Sometimes, there are no symptoms.  This condition is diagnosed with an ear exam and tests, such as pneumatic otoscopy, tympanogram, and audiogram.  Treatment for this condition depends on the cause. In many cases, the fluid goes away on its own. This information is not intended to replace advice given to you by your health care provider. Make sure you discuss any questions you have with your health care  provider. Document Released: 10/25/2003 Document Revised: 06/26/2016 Document Reviewed: 06/26/2016 Elsevier Interactive Patient Education  2017 [employer] Allergic Rhinitis Allergic rhinitis is when the mucous membranes in the nose respond to allergens. Allergens are particles in the air that cause your body to have an allergic reaction. This causes you to release allergic antibodies. Through a chain of events, these eventually cause you to release histamine into the blood stream. Although meant to protect the body, it is this release of histamine that causes your discomfort, such as frequent sneezing, congestion, and an itchy, runny nose. What are the causes? Seasonal allergic rhinitis (hay fever) is caused by pollen allergens that may come from grasses, trees, and weeds. Year-round allergic rhinitis (perennial allergic rhinitis) is caused by allergens such as house dust mites, pet dander, and mold spores. What are the signs or symptoms?  Nasal stuffiness (congestion).  Itchy, runny nose with sneezing and tearing of the eyes. How is this diagnosed? Your health care provider can help you determine  the allergen or allergens that trigger your symptoms. If you and your health care provider are unable to determine the allergen, skin or blood testing may be used. Your health care provider will diagnose your condition after taking your health history and performing a physical exam. Your health care provider may assess you for other related conditions, such as asthma, pink eye, or an ear infection. How is this treated? Allergic rhinitis does not have a cure, but it can be controlled by:  Medicines that block allergy symptoms. These may include allergy shots, nasal sprays, and oral antihistamines.  Avoiding the allergen.  Hay fever may often be treated with antihistamines in pill or nasal spray forms. Antihistamines block the effects of histamine. There are over-the-counter medicines that may help  with nasal congestion and swelling around the eyes. Check with your health care provider before taking or giving this medicine. If avoiding the allergen or the medicine prescribed do not work, there are many new medicines your health care provider can prescribe. Stronger medicine may be used if initial measures are ineffective. Desensitizing injections can be used if medicine and avoidance does not work. Desensitization is when a patient is given ongoing shots until the body becomes less sensitive to the allergen. Make sure you follow up with your health care provider if problems continue. Follow these instructions at home: It is not possible to completely avoid allergens, but you can reduce your symptoms by taking steps to limit your exposure to them. It helps to know exactly what you are allergic to so that you can avoid your specific triggers. Contact a health care provider if:  You have a fever.  You develop a cough that does not stop easily (persistent).  You have shortness of breath.  You start wheezing.  Symptoms interfere with normal daily activities. This information is not intended to replace advice given to you by your health care provider. Make sure you discuss any questions you have with your health care provider. Document Released: 04/29/2001 Document Revised: 04/04/2016 Document Reviewed: 04/11/2013 Elsevier Interactive Patient Education  2017 [employer] Sinus Rinse What is a sinus rinse? A sinus rinse is a simple home treatment that is used to rinse your sinuses with a sterile mixture of salt and water (saline solution). Sinuses are air-filled spaces in your skull behind the bones of your face and forehead that open into your nasal cavity. You will use the following:  Saline solution.  Neti pot or spray bottle. This releases the saline solution into your nose and through your sinuses. Neti pots and spray bottles can be purchased at [employer], a health food  store, or online.  When would I do a sinus rinse? A sinus rinse can help to clear mucus, dirt, dust, or pollen from the nasal cavity. You may do a sinus rinse when you have a cold, a virus, nasal allergy symptoms, a sinus infection, or stuffiness in the nose or sinuses. If you are considering a sinus rinse:  Ask your child's health care provider before performing a sinus rinse on your child.  Do not do a sinus rinse if you have had ear or nasal surgery, ear infection, or blocked ears.  How do I do a sinus rinse?  Wash your hands.  Disinfect your device according to the directions provided and then dry it.  Use the solution that comes with your device or one that is sold separately in stores. Follow the mixing directions on the package.  Fill your  device with the amount of saline solution as directed by the device instructions.  Stand over a sink and tilt your head sideways over the sink.  Place the spout of the device in your upper nostril (the one closer to the ceiling).  Gently pour or squeeze the saline solution into the nasal cavity. The liquid should drain to the lower nostril if you are not overly congested.  Gently blow your nose. Blowing too hard may cause ear pain.  Repeat in the other nostril.  Clean and rinse your device with clean water and then air-dry it. Are there risks of a sinus rinse? Sinus rinse is generally very safe and effective. However, there are a few risks, which include:  A burning sensation in the sinuses. This may happen if you do not make the saline solution as directed. Make sure to follow all directions when making the saline solution.  Infection from contaminated water. This is rare, but possible.  Nasal irritation.  This information is not intended to replace advice given to you by your health care provider. Make sure you discuss any questions you have with your health care provider. Document Released: 03/01/2014 Document Revised: 07/01/2016  Document Reviewed: 12/20/2013 Elsevier Interactive Patient Education  2017 [employer] Sinusitis, Adult Sinusitis is soreness and inflammation of your sinuses. Sinuses are hollow spaces in the bones around your face. Your sinuses are located:  Around your eyes.  In the middle of your forehead.  Behind your nose.  In your cheekbones.  Your sinuses and nasal passages are lined with a stringy fluid (mucus). Mucus normally drains out of your sinuses. When your nasal tissues become inflamed or swollen, the mucus can become trapped or blocked so air cannot flow through your sinuses. This allows bacteria, viruses, and funguses to grow, which leads to infection. Sinusitis can develop quickly and last for 7?10 days (acute) or for more than 12 weeks (chronic). Sinusitis often develops after a cold. What are the causes? This condition is caused by anything that creates swelling in the sinuses or stops mucus from draining, including:  Allergies.  Asthma.  Bacterial or viral infection.  Abnormally shaped bones between the nasal passages.  Nasal growths that contain mucus (nasal polyps).  Narrow sinus openings.  Pollutants, such as chemicals or irritants in the air.  A foreign object stuck in the nose.  A fungal infection. This is rare.  What increases the risk? The following factors may make you more likely to develop this condition:  Having allergies or asthma.  Having had a recent cold or respiratory tract infection.  Having structural deformities or blockages in your nose or sinuses.  Having a weak immune system.  Doing a lot of swimming or diving.  Overusing nasal sprays.  Smoking.  What are the signs or symptoms? The main symptoms of this condition are pain and a feeling of pressure around the affected sinuses. Other symptoms include:  Upper toothache.  Earache.  Headache.  Bad breath.  Decreased sense of smell and taste.  A cough that may get worse at  night.  Fatigue.  Fever.  Thick drainage from your nose. The drainage is often green and it may contain pus (purulent).  Stuffy nose or congestion.  Postnasal drip. This is when extra mucus collects in the throat or back of the nose.  Swelling and warmth over the affected sinuses.  Sore throat.  Sensitivity to light.  How is this diagnosed? This condition is diagnosed based on symptoms, a medical  history, and a physical exam. To find out if your condition is acute or chronic, your health care provider may:  Look in your nose for signs of nasal polyps.  Tap over the affected sinus to check for signs of infection.  View the inside of your sinuses using an imaging device that has a light attached (endoscope).  If your health care provider suspects that you have chronic sinusitis, you may also:  Be tested for allergies.  Have a sample of mucus taken from your nose (nasal culture) and checked for bacteria.  Have a mucus sample examined to see if your sinusitis is related to an allergy.  If your sinusitis does not respond to treatment and it lasts longer than 8 weeks, you may have an MRI or CT scan to check your sinuses. These scans also help to determine how severe your infection is. In rare cases, a bone biopsy may be done to rule out more serious types of fungal sinus disease. How is this treated? Treatment for sinusitis depends on the cause and whether your condition is chronic or acute. If a virus is causing your sinusitis, your symptoms will go away on their own within 10 days. You may be given medicines to relieve your symptoms, including:  Topical nasal decongestants. They shrink swollen nasal passages and let mucus drain from your sinuses.  Antihistamines. These drugs block inflammation that is triggered by allergies. This can help to ease swelling in your nose and sinuses.  Topical nasal corticosteroids. These are nasal sprays that ease inflammation and swelling in  your nose and sinuses.  Nasal saline washes. These rinses can help to get rid of thick mucus in your nose.  If your condition is caused by bacteria, you will be given an antibiotic medicine. If your condition is caused by a fungus, you will be given an antifungal medicine. Surgery may be needed to correct underlying conditions, such as narrow nasal passages. Surgery may also be needed to remove polyps. Follow these instructions at home: Medicines  Take, use, or apply over-the-counter and prescription medicines only as told by your health care provider. These may include nasal sprays.  If you were prescribed an antibiotic medicine, take it as told by your health care provider. Do not stop taking the antibiotic even if you start to feel better. Hydrate and Humidify  Drink enough water to keep your urine clear or pale yellow. Staying hydrated will help to thin your mucus.  Use a cool mist humidifier to keep the humidity level in your home above 50%.  Inhale steam for 10-15 minutes, 3-4 times a day or as told by your health care provider. You can do this in the bathroom while a hot shower is running.  Limit your exposure to cool or dry air. Rest  Rest as much as possible.  Sleep with your head raised (elevated).  Make sure to get enough sleep each night. General instructions  Apply a warm, moist washcloth to your face 3-4 times a day or as told by your health care provider. This will help with discomfort.  Wash your hands often with soap and water to reduce your exposure to viruses and other germs. If soap and water are not available, use hand sanitizer.  Do not smoke. Avoid being around people who are smoking (secondhand smoke).  Keep all follow-up visits as told by your health care provider. This is important. Contact a health care provider if:  You have a fever.  Your symptoms get  worse.  Your symptoms do not improve within 10 days. Get help right away if:  You have a  severe headache.  You have persistent vomiting.  You have pain or swelling around your face or eyes.  You have vision problems.  You develop confusion.  Your neck is stiff.  You have trouble breathing. This information is not intended to replace advice given to you by your health care provider. Make sure you discuss any questions you have with your health care provider. Document Released: 08/04/2005 Document Revised: 03/30/2016 Document Reviewed: 05/30/2015 Elsevier Interactive Patient Education  2017 [employer].

## 2017-03-11 LAB — LAB REPORT - SCANNED
A1C: 5.7
ALT: 12
AST: 14
CHOLESTEROL, TOTAL: 172
Creatinine, Ser: 0.71
FERRITIN: 87
HDL: 46
LDL Cholesterol (Calc): 71
TRIGLYCERIDES: 276
TSH: 2.72
Vitamin D 1, 25 (OH)2 Total: 24.4

## 2017-03-12 ENCOUNTER — Encounter: Payer: Self-pay | Admitting: Registered Nurse

## 2017-03-12 ENCOUNTER — Telehealth: Payer: Self-pay | Admitting: Registered Nurse

## 2017-03-12 MED ORDER — DOXYCYCLINE HYCLATE 100 MG PO TABS: 100.0000 mg | ORAL_TABLET | Freq: Two times a day (BID) | ORAL | 0 refills | Status: AC

## 2017-03-12 NOTE — Telephone Encounter (Signed)
Prednisone oral did not help patient would like to start antibiotic as discharge green/pain/pressure sinus.  Doxycycline 100mg  po BID x 10 days #20 RF0 dispensed from [facility] for patient.  Patient at home today and requested we give Rx to daughter to bring to her at home after work today.

## 2017-04-07 ENCOUNTER — Encounter: Payer: Self-pay | Admitting: Family Medicine

## 2017-04-15 ENCOUNTER — Encounter: Payer: Self-pay | Admitting: Family Medicine

## 2017-04-15 ENCOUNTER — Other Ambulatory Visit: Payer: Self-pay | Admitting: Family Medicine

## 2017-04-15 DIAGNOSIS — R7989 Other specified abnormal findings of blood chemistry: Secondary | ICD-10-CM

## 2017-04-21 ENCOUNTER — Ambulatory Visit: Payer: Self-pay | Admitting: Registered Nurse

## 2017-04-21 VITALS — BP 126/86 | HR 80 | Temp 98.8°F

## 2017-04-21 DIAGNOSIS — R059 Cough, unspecified: Secondary | ICD-10-CM

## 2017-04-21 DIAGNOSIS — R05 Cough: Secondary | ICD-10-CM

## 2017-04-21 DIAGNOSIS — H6593 Unspecified nonsuppurative otitis media, bilateral: Secondary | ICD-10-CM

## 2017-04-21 DIAGNOSIS — J019 Acute sinusitis, unspecified: Secondary | ICD-10-CM

## 2017-04-21 MED ORDER — MONTELUKAST SODIUM 10 MG PO TABS: 10.0000 mg | ORAL_TABLET | Freq: Every day | ORAL | 3 refills | Status: DC

## 2017-04-21 NOTE — Progress Notes (Signed)
Subjective:    Patient ID: [name], female    DOB: 1960/08/28, 56 y.o.   MRN: [id number]  56y/o established caucasian female Pt reports L ear pain x3 days. +muffled sounds. Denies drainage. And still having cough despite inhaled steroids and flonase wants to know if she needs any further testing also on omeprazole has never had scope; had spirometry and lung xray earlier this year and was started on inhaler; using nasal saline in shower daily  Former smoker quit 1983 and father smoked in house when she was a child; has been seeing civilian provider [employer]  [facility] for weight loss 13 lbs lost in past few months work benefit PMHx obesity, eosinophilia, hypothryoidism, vitamin D deficiency      Review of Systems  Constitutional: Negative for activity change, appetite change, chills, diaphoresis, fatigue, fever and unexpected weight change.  HENT: Positive for congestion, ear pain, postnasal drip and rhinorrhea. Negative for dental problem, drooling, ear discharge, facial swelling, hearing loss, mouth sores, nosebleeds, sinus pain, sinus pressure, sneezing, sore throat, tinnitus, trouble swallowing and voice change.   Eyes: Negative for photophobia, pain, discharge, redness, itching and visual disturbance.  Respiratory: Positive for cough. Negative for choking, chest tightness, shortness of breath, wheezing and stridor.   Cardiovascular: Negative for chest pain, palpitations and leg swelling.  Gastrointestinal: Negative for abdominal distention, abdominal pain, blood in stool, constipation, diarrhea, nausea and vomiting.  Endocrine: Negative for cold intolerance and heat intolerance.  Genitourinary: Negative for difficulty urinating, dysuria and hematuria.  Musculoskeletal: Negative for arthralgias, back pain, gait problem, joint swelling, myalgias, neck pain and neck stiffness.  Skin: Negative for color change, pallor, rash and wound.  Allergic/Immunologic: Positive for  environmental allergies. Negative for food allergies.  Neurological: Negative for dizziness, tremors, seizures, syncope, facial asymmetry, speech difficulty, weakness, light-headedness, numbness and headaches.  Hematological: Negative for adenopathy. Does not bruise/bleed easily.  Psychiatric/Behavioral: Negative for agitation, behavioral problems, confusion and sleep disturbance.       Objective:   Physical Exam  Constitutional: She is oriented to person, place, and time. Vital signs are normal. She appears well-developed and well-nourished. She is active and cooperative.  Non-toxic appearance. She does not have a sickly appearance. She does not appear ill. No distress.  HENT:  Head: Normocephalic and atraumatic.  Right Ear: Hearing, external ear and ear canal normal. A middle ear effusion is present.  Left Ear: Hearing, external ear and ear canal normal. A middle ear effusion is present.  Nose: Mucosal edema and rhinorrhea present. No nose lacerations, sinus tenderness, nasal deformity, septal deviation or nasal septal hematoma. No epistaxis.  No foreign bodies. Right sinus exhibits maxillary sinus tenderness. Right sinus exhibits no frontal sinus tenderness. Left sinus exhibits maxillary sinus tenderness. Left sinus exhibits no frontal sinus tenderness.  Mouth/Throat: Uvula is midline. Mucous membranes are not pale, dry and not cyanotic. She does not have dentures. No oral lesions. No trismus in the jaw. Normal dentition. No dental abscesses, uvula swelling, lacerations or dental caries. Posterior oropharyngeal edema and posterior oropharyngeal erythema present. No oropharyngeal exudate or tonsillar abscesses.  Cobblestoning posterior pharynx/ bilateral TMs air fluid level clear; bilateral allergic shiners; bilateral nasal turbinates edema/erythema clear discharge; maxillary sinuses bilaterally pressure with palpation  Eyes: Pupils are equal, round, and reactive to light. Conjunctivae, EOM and  lids are normal. Right eye exhibits no chemosis, no discharge, no exudate and no hordeolum. No foreign body present in the right eye. Left eye exhibits no chemosis, no discharge,  no exudate and no hordeolum. No foreign body present in the left eye. Right conjunctiva is not injected. Right conjunctiva has no hemorrhage. Left conjunctiva is not injected. Left conjunctiva has no hemorrhage. No scleral icterus. Right eye exhibits normal extraocular motion and no nystagmus. Left eye exhibits normal extraocular motion and no nystagmus. Right pupil is round and reactive. Left pupil is round and reactive. Pupils are equal.  Neck: Trachea normal, normal range of motion and phonation normal. Neck supple. No tracheal tenderness, no spinous process tenderness and no muscular tenderness present. No neck rigidity. No tracheal deviation, no edema, no erythema and normal range of motion present. No thyroid mass and no thyromegaly present.  Cardiovascular: Normal rate, regular rhythm, S1 normal, S2 normal, normal heart sounds and intact distal pulses.  PMI is not displaced.  Exam reveals no gallop and no friction rub.   No murmur heard. Pulmonary/Chest: Effort normal and breath sounds normal. No accessory muscle usage or stridor. No respiratory distress. She has no decreased breath sounds. She has no wheezes. She has no rhonchi. She has no rales. She exhibits no tenderness.  Nonproductive infrequent cough observed in exam room; spoke full sentences without difficulty  Abdominal: Soft. Normal appearance. She exhibits no distension, no fluid wave and no ascites. There is no rigidity and no guarding.  Musculoskeletal: Normal range of motion. She exhibits no edema or tenderness.       Right shoulder: Normal.       Left shoulder: Normal.       Right hip: Normal.       Left hip: Normal.       Right knee: Normal.       Left knee: Normal.       Cervical back: Normal.       Right hand: Normal.       Left hand: Normal.   Lymphadenopathy:       Head (right side): No submental, no submandibular, no tonsillar, no preauricular, no posterior auricular and no occipital adenopathy present.       Head (left side): No submental, no submandibular, no tonsillar, no preauricular, no posterior auricular and no occipital adenopathy present.    She has no cervical adenopathy.       Right cervical: No superficial cervical, no deep cervical and no posterior cervical adenopathy present.      Left cervical: No superficial cervical, no deep cervical and no posterior cervical adenopathy present.  Neurological: She is alert and oriented to person, place, and time. She has normal strength. She is not disoriented. She displays no atrophy and no tremor. No cranial nerve deficit or sensory deficit. She exhibits normal muscle tone. She displays no seizure activity. Coordination and gait normal. GCS eye subscore is 4. GCS verbal subscore is 5. GCS motor subscore is 6.  On/off exam table; in/out of chair without difficulty; gait sure and steady in exam room  Skin: Skin is warm, dry and intact. No abrasion, no bruising, no burn, no ecchymosis, no laceration, no lesion, no petechiae and no rash noted. She is not diaphoretic. No cyanosis or erythema. No pallor. Nails show no clubbing.  Psychiatric: She has a normal mood and affect. Her speech is normal and behavior is normal. Judgment and thought content normal. Cognition and memory are normal.  Nursing note and vitals reviewed.         Assessment & Plan:  A-acute rhinosinusitis; bilateral otitis media effusion, cough  P-continue flonase but change to 1 spray each nostril  bid has at home; nasal saline increase usage to 2 sprays each nostril q2h wa; cough lozenges 1 po q2h prn cough OTC; start singulair 10mg  po qhs #30 RF2 electronic Rx to pt pharmacy of choice; shower prior to bedtime; hydrate as mucous membranes dry  Discussed if no improved with plan of care 48 hours will give her Rx for  oral steroids or antibiotics patient would like to try steroids first; continue budesonide at home reviewed spirometry results with patient and discussed causes of chronic cough has never had EGD/CSP and patient with history eosinophilia DDx?eosinophilic esophagitis cause of cough?  Negative chest xray spirometry Jan 2018 showed lower than expected FVC/constriction discussed with patient asthma/COPD possible with smoking history also if no improvement after sinusitis resolved consider GI appt/scope and/or PCM appt to increase inhaled steroids.  Exitcare handouts on sinusitis, sinus rinse, cough, and MDI use  Probable Bronchitis simple, community acquired, may have started as viral (probably respiratory syncytial, parainfluenza, influenza, or adenovirus) from post nasal drip.  Microscopic examination or C&S of sputum in the healthy adult with acute bronchitis is generally not helpful (usually negative or normal respiratory flora) other considerations being cough from upper respiratory tract infections, sinusitis or allergic syndromes (mild asthma or viral pneumonia).  Differential Diagnosis:  reactive airway disease (asthma, allergic aspergillosis (eosinophilia), chronic bronchitis, respiratory infection (Sinusitis, Common cold, pneumonia), congestive heart failure, reflux esophagitis, bronchogenic tumor, aspiration syndromes and/or exposure irritants/tobacco smoke.  In this case, there is no evidence of any invasive bacterial illness.  Most likely viral etiology so will hold on antibiotic treatment.  Advise supportive care with rest, encourage fluids, good hygiene and watch for any worsening symptoms.  If they were to develop:  come back to the office or go to the emergency room if after hours.  Without high fever, severe dyspnea, lack of physical findings or other risk factors, I will hold on a chest radiograph and CBC at this time. I discussed that approximately 50% of patients with acute bronchitis have a cough  that lasts up to three weeks, and 25% for over a month.  Tylenol, one to two tablets every four hours as needed for fever or myalgias.   No aspirin.  Patient instructed to follow up in one week or sooner if symptoms worsen. Patient verbalized agreement and understanding of treatment plan.  P2:  hand washing and cover cough  Supportive treatment.   No evidence of invasive bacterial infection, non toxic and well hydrated.  This is most likely self limiting viral infection.  I do not see where any further testing or imaging is necessary at this time.   I will suggest supportive care, rest, good hygiene and encourage the patient to take adequate fluids.  The patient is to return to clinic or EMERGENCY ROOM if symptoms worsen or change significantly e.g. ear pain, fever, purulent discharge from ears or bleeding.  Exitcare handout on otitis media with effusion given to patient.  Patient verbalized agreement and understanding of treatment plan.    Patient may use normal saline nasal spray as needed.  Consider antihistamine or nasal steroid use.  Avoid triggers if possible.  Shower prior to bedtime if exposed to triggers.  If allergic dust/dust mites recommend mattress/pillow covers/encasements; washing linens, vacuuming, sweeping, dusting weekly.  Call or return to clinic as needed if these symptoms worsen or fail to improve as anticipated.   Exitcare handout on allergic rhinitis given to patient.  Patient verbalized understanding of instructions, agreed with plan of  care and had no further questions at this time.  P2:  Avoidance and hand washing.

## 2017-04-21 NOTE — Patient Instructions (Addendum)
Cough, Adult Coughing is a reflex that clears your throat and your airways. Coughing helps to heal and protect your lungs. It is normal to cough occasionally, but a cough that happens with other symptoms or lasts a long time may be a sign of a condition that needs treatment. A cough may last only 2-3 weeks (acute), or it may last longer than 8 weeks (chronic). What are the causes? Coughing is commonly caused by:  Breathing in substances that irritate your lungs.  A viral or bacterial respiratory infection.  Allergies.  Asthma.  Postnasal drip.  Smoking.  Acid backing up from the stomach into the esophagus (gastroesophageal reflux).  Certain medicines.  Chronic lung problems, including COPD (or rarely, lung cancer).  Other medical conditions such as heart failure.  Follow these instructions at home: Pay attention to any changes in your symptoms. Take these actions to help with your discomfort:  Take medicines only as told by your health care provider. ? If you were prescribed an antibiotic medicine, take it as told by your health care provider. Do not stop taking the antibiotic even if you start to feel better. ? Talk with your health care provider before you take a cough suppressant medicine.  Drink enough fluid to keep your urine clear or pale yellow.  If the air is dry, use a cold steam vaporizer or humidifier in your bedroom or your home to help loosen secretions.  Avoid anything that causes you to cough at work or at home.  If your cough is worse at night, try sleeping in a semi-upright position.  Avoid cigarette smoke. If you smoke, quit smoking. If you need help quitting, ask your health care provider.  Avoid caffeine.  Avoid alcohol.  Rest as needed.  Contact a health care provider if:  You have new symptoms.  You cough up pus.  Your cough does not get better after 2-3 weeks, or your cough gets worse.  You cannot control your cough with suppressant  medicines and you are losing sleep.  You develop pain that is getting worse or pain that is not controlled with pain medicines.  You have a fever.  You have unexplained weight loss.  You have night sweats. Get help right away if:  You cough up blood.  You have difficulty breathing.  Your heartbeat is very fast. This information is not intended to replace advice given to you by your health care provider. Make sure you discuss any questions you have with your health care provider. Document Released: 01/31/2011 Document Revised: 01/10/2016 Document Reviewed: 10/11/2014 Elsevier Interactive Patient Education  2017 [place]. Otitis Media With Effusion, Pediatric Otitis media with effusion (OME) occurs when there is inflammation of the middle ear and fluid in the middle ear space. There are no signs and symptoms of infection. The middle ear space contains air and the bones for hearing. Air in the middle ear space helps to transmit sound to the brain. OME is a common condition in children, and it often occurs after an ear infection. This condition may be present for several weeks or longer after an ear infection. Most cases of this condition get better on their own. What are the causes? OME is caused by a blockage of the eustachian tube in one or both ears. These tubes drain fluid in the ears to the back of the nose (nasopharynx). If the tissue in the tube swells up (edema), the tube closes. This prevents fluid from draining. Blockage can be caused by:  Ear infections.  Colds and other upper respiratory infections.  Allergies.  Irritants, such as tobacco smoke.  Enlarged adenoids. The adenoids are areas of soft tissue located high in the back of the throat, behind the nose and the roof of the mouth. They are part of the body's natural defense (immune) system.  A mass in the nasopharynx.  Damage to the ear caused by pressure changes (barotrauma).  What increases the risk? Your  child is more likely to develop this condition if:  He or she has repeated ear and sinus infections.  He or she has allergies.  He or she is exposed to tobacco smoke.  He or she attends daycare.  He or she is not breastfed.  What are the signs or symptoms? Symptoms of this condition may not be obvious. Sometimes this condition does not have any symptoms, or symptoms may overlap with those of a cold or upper respiratory tract illness. Symptoms of this condition include:  Temporary hearing loss.  A feeling of fullness in the ear without pain.  Irritability or agitation.  Balance (vestibular) problems.  As a result of hearing loss, your child may:  Listen to the TV at a loud volume.  Not respond to questions.  Ask "What?" often when spoken to.  Mistake or confuse one sound or word for another.  Perform poorly at school.  Have a poor attention span.  Become agitated or irritated easily.  How is this diagnosed? This condition is diagnosed with an ear exam. Your child's health care provider will look inside your child's ear with an instrument (otoscope) to check for redness, swelling, and fluid. Other tests may be done, including:  A test to check the movement of the eardrum (pneumatic otoscopy). This is done by squeezing a small amount of air into the ear.  A test that changes air pressure in the middle ear to check how well the eardrum moves and to see if the eustachian tube is working (tympanogram).  Hearing test (audiogram). This test involves playing tones at different pitches to see if your child can hear each tone.  How is this treated? Treatment for this condition depends on the cause. In many cases, the fluid goes away on its own. In some cases, your child may need a procedure to create a hole in the eardrum to allow fluid to drain (myringotomy) and to insert small drainage tubes (tympanostomy tubes) into the eardrums. These tubes help to drain fluid and prevent  infection. This procedure may be recommended if:  OME does not get better over several months.  Your child has many ear infections within several months.  Your child has noticeable hearing loss.  Your child has problems with speech and language development.  Surgery may also be done to remove the adenoids (adenoidectomy). Follow these instructions at home:  Give over-the-counter and prescription medicines only as told by your child's health care provider.  Keep children away from any tobacco smoke.  Keep all follow-up visits as told by your child's health care provider. This is important. How is this prevented?  Keep your child's vaccinations up to date. Make sure your child gets all recommended vaccinations, including a pneumonia and flu vaccine.  Encourage hand washing. Your child should wash his or her hands often with soap and water. If there is no soap and water, he or she should use hand sanitizer.  Avoid exposing your child to tobacco smoke.  Breastfeed your baby, if possible. Babies who are breastfed as  long as possible are less likely to develop this condition. Contact a health care provider if:  Your child's hearing does not get better after 3 months.  Your child's hearing is worse.  Your child has ear pain.  Your child has a fever.  Your child has drainage from the ear.  Your child is dizzy.  Your child has a lump on his or her neck. Get help right away if:  Your child has bleeding from the nose.  Your child cannot move part of her or his face.  Your child has trouble breathing.  Your child cannot smell.  Your child develops severe congestion.  Your child develops weakness.  Your child who is younger than 3 months has a temperature of 100F (38C) or higher. Summary  Otitis media with effusion (OME) occurs when there is inflammation of the middle ear and fluid in the middle ear space.  This condition is caused by blockage of one or both  eustachian tubes, which drain fluid in the ears to the back of the nose.  Symptoms of this condition can include temporary hearing loss, a feeling of fullness in the ear, irritability or agitation, and balance (vestibular) problems. Sometimes, there are no symptoms.  This condition is diagnosed with an ear exam and tests, such as pneumatic otoscopy, tympanogram, and audiogram.  Treatment for this condition depends on the cause. In many cases, the fluid goes away on its own. This information is not intended to replace advice given to you by your health care provider. Make sure you discuss any questions you have with your health care provider. Document Released: 10/25/2003 Document Revised: 06/26/2016 Document Reviewed: 06/26/2016 Elsevier Interactive Patient Education  2017 [employer] Sinusitis, Adult Sinusitis is soreness and inflammation of your sinuses. Sinuses are hollow spaces in the bones around your face. Your sinuses are located:  Around your eyes.  In the middle of your forehead.  Behind your nose.  In your cheekbones.  Your sinuses and nasal passages are lined with a stringy fluid (mucus). Mucus normally drains out of your sinuses. When your nasal tissues become inflamed or swollen, the mucus can become trapped or blocked so air cannot flow through your sinuses. This allows bacteria, viruses, and funguses to grow, which leads to infection. Sinusitis can develop quickly and last for 7?10 days (acute) or for more than 12 weeks (chronic). Sinusitis often develops after a cold. What are the causes? This condition is caused by anything that creates swelling in the sinuses or stops mucus from draining, including:  Allergies.  Asthma.  Bacterial or viral infection.  Abnormally shaped bones between the nasal passages.  Nasal growths that contain mucus (nasal polyps).  Narrow sinus openings.  Pollutants, such as chemicals or irritants in the air.  A foreign object stuck in  the nose.  A fungal infection. This is rare.  What increases the risk? The following factors may make you more likely to develop this condition:  Having allergies or asthma.  Having had a recent cold or respiratory tract infection.  Having structural deformities or blockages in your nose or sinuses.  Having a weak immune system.  Doing a lot of swimming or diving.  Overusing nasal sprays.  Smoking.  What are the signs or symptoms? The main symptoms of this condition are pain and a feeling of pressure around the affected sinuses. Other symptoms include:  Upper toothache.  Earache.  Headache.  Bad breath.  Decreased sense of smell and taste.  A cough  that may get worse at night.  Fatigue.  Fever.  Thick drainage from your nose. The drainage is often green and it may contain pus (purulent).  Stuffy nose or congestion.  Postnasal drip. This is when extra mucus collects in the throat or back of the nose.  Swelling and warmth over the affected sinuses.  Sore throat.  Sensitivity to light.  How is this diagnosed? This condition is diagnosed based on symptoms, a medical history, and a physical exam. To find out if your condition is acute or chronic, your health care provider may:  Look in your nose for signs of nasal polyps.  Tap over the affected sinus to check for signs of infection.  View the inside of your sinuses using an imaging device that has a light attached (endoscope).  If your health care provider suspects that you have chronic sinusitis, you may also:  Be tested for allergies.  Have a sample of mucus taken from your nose (nasal culture) and checked for bacteria.  Have a mucus sample examined to see if your sinusitis is related to an allergy.  If your sinusitis does not respond to treatment and it lasts longer than 8 weeks, you may have an MRI or CT scan to check your sinuses. These scans also help to determine how severe your infection is. In  rare cases, a bone biopsy may be done to rule out more serious types of fungal sinus disease. How is this treated? Treatment for sinusitis depends on the cause and whether your condition is chronic or acute. If a virus is causing your sinusitis, your symptoms will go away on their own within 10 days. You may be given medicines to relieve your symptoms, including:  Topical nasal decongestants. They shrink swollen nasal passages and let mucus drain from your sinuses.  Antihistamines. These drugs block inflammation that is triggered by allergies. This can help to ease swelling in your nose and sinuses.  Topical nasal corticosteroids. These are nasal sprays that ease inflammation and swelling in your nose and sinuses.  Nasal saline washes. These rinses can help to get rid of thick mucus in your nose.  If your condition is caused by bacteria, you will be given an antibiotic medicine. If your condition is caused by a fungus, you will be given an antifungal medicine. Surgery may be needed to correct underlying conditions, such as narrow nasal passages. Surgery may also be needed to remove polyps. Follow these instructions at home: Medicines  Take, use, or apply over-the-counter and prescription medicines only as told by your health care provider. These may include nasal sprays.  If you were prescribed an antibiotic medicine, take it as told by your health care provider. Do not stop taking the antibiotic even if you start to feel better. Hydrate and Humidify  Drink enough water to keep your urine clear or pale yellow. Staying hydrated will help to thin your mucus.  Use a cool mist humidifier to keep the humidity level in your home above 50%.  Inhale steam for 10-15 minutes, 3-4 times a day or as told by your health care provider. You can do this in the bathroom while a hot shower is running.  Limit your exposure to cool or dry air. Rest  Rest as much as possible.  Sleep with your head raised  (elevated).  Make sure to get enough sleep each night. General instructions  Apply a warm, moist washcloth to your face 3-4 times a day or as told by your  health care provider. This will help with discomfort.  Wash your hands often with soap and water to reduce your exposure to viruses and other germs. If soap and water are not available, use hand sanitizer.  Do not smoke. Avoid being around people who are smoking (secondhand smoke).  Keep all follow-up visits as told by your health care provider. This is important. Contact a health care provider if:  You have a fever.  Your symptoms get worse.  Your symptoms do not improve within 10 days. Get help right away if:  You have a severe headache.  You have persistent vomiting.  You have pain or swelling around your face or eyes.  You have vision problems.  You develop confusion.  Your neck is stiff.  You have trouble breathing. This information is not intended to replace advice given to you by your health care provider. Make sure you discuss any questions you have with your health care provider. Document Released: 08/04/2005 Document Revised: 03/30/2016 Document Reviewed: 05/30/2015 Elsevier Interactive Patient Education  2017 [employer] Sinus Rinse What is a sinus rinse? A sinus rinse is a simple home treatment that is used to rinse your sinuses with a sterile mixture of salt and water (saline solution). Sinuses are air-filled spaces in your skull behind the bones of your face and forehead that open into your nasal cavity. You will use the following:  Saline solution.  Neti pot or spray bottle. This releases the saline solution into your nose and through your sinuses. Neti pots and spray bottles can be purchased at [employer], a health food store, or online.  When would I do a sinus rinse? A sinus rinse can help to clear mucus, dirt, dust, or pollen from the nasal cavity. You may do a sinus rinse when you have  a cold, a virus, nasal allergy symptoms, a sinus infection, or stuffiness in the nose or sinuses. If you are considering a sinus rinse:  Ask your child's health care provider before performing a sinus rinse on your child.  Do not do a sinus rinse if you have had ear or nasal surgery, ear infection, or blocked ears.  How do I do a sinus rinse?  Wash your hands.  Disinfect your device according to the directions provided and then dry it.  Use the solution that comes with your device or one that is sold separately in stores. Follow the mixing directions on the package.  Fill your device with the amount of saline solution as directed by the device instructions.  Stand over a sink and tilt your head sideways over the sink.  Place the spout of the device in your upper nostril (the one closer to the ceiling).  Gently pour or squeeze the saline solution into the nasal cavity. The liquid should drain to the lower nostril if you are not overly congested.  Gently blow your nose. Blowing too hard may cause ear pain.  Repeat in the other nostril.  Clean and rinse your device with clean water and then air-dry it. Are there risks of a sinus rinse? Sinus rinse is generally very safe and effective. However, there are a few risks, which include:  A burning sensation in the sinuses. This may happen if you do not make the saline solution as directed. Make sure to follow all directions when making the saline solution.  Infection from contaminated water. This is rare, but possible.  Nasal irritation.  This information is not intended to replace advice given  to you by your health care provider. Make sure you discuss any questions you have with your health care provider. Document Released: 03/01/2014 Document Revised: 07/01/2016 Document Reviewed: 12/20/2013 Elsevier Interactive Patient Education  2017 [place].   How to Use a Metered Dose Inhaler A metered dose inhaler is a handheld device  for taking medicine that must be breathed into the lungs (inhaled). The device can be used to deliver a variety of inhaled medicines, including:  Quick relief or rescue medicines, such as bronchodilators.  Controller medicines, such as corticosteroids.  The medicine is delivered by pushing down on a metal canister to release a preset amount of spray and medicine. Each device contains the amount of medicine that is needed for a preset number of uses (inhalations). Your health care provider may recommend that you use a spacer with your inhaler to help you take the medicine more effectively. A spacer is a plastic tube with a mouthpiece on one end and an opening that connects to the inhaler on the other end. A spacer holds the medicine in a tube for a short time, which allows you to inhale more medicine. What are the risks? If you do not use your inhaler correctly, medicine might not reach your lungs to help you breathe. Inhaler medicine can cause side effects, such as:  Mouth or throat infection.  Cough.  Hoarseness.  Headache.  Nausea and vomiting.  Lung infection (pneumonia) in people who have a lung condition called COPD.  How to use a metered dose inhaler without a spacer 1. Remove the cap from the inhaler. 2. If you are using the inhaler for the first time, shake it for 5 seconds, turn it away from your face, then release 4 puffs into the air. This is called priming. 3. Shake the inhaler for 5 seconds. 4. Position the inhaler so the top of the canister faces up. 5. Put your index finger on the top of the medicine canister. Support the bottom of the inhaler with your thumb. 6. Breathe out normally and as completely as possible, away from the inhaler. 7. Either place the inhaler between your teeth and close your lips tightly around the mouthpiece, or hold the inhaler 1-2 inches (2.5-5 cm) away from your open mouth. Keep your tongue down out of the way. If you are unsure which technique  to use, ask your health care provider. 8. Press the canister down with your index finger to release the medicine, then inhale deeply and slowly through your mouth (not your nose) until your lungs are completely filled. Inhaling should take 4-6 seconds. 9. Hold the medicine in your lungs for 5-10 seconds (10 seconds is best). This helps the medicine get into the small airways of your lungs. 10. With your lips in a tight circle (pursed), breathe out slowly. 11. Repeat steps 3-10 until you have taken the number of puffs that your health care provider directed. Wait about 1 minute between puffs or as directed. 12. Put the cap on the inhaler. 13. If you are using a steroid inhaler, rinse your mouth with water, gargle, and spit out the water. Do not swallow the water. How to use a metered dose inhaler with a spacer 1. Remove the cap from the inhaler. 2. If you are using the inhaler for the first time, shake it for 5 seconds, turn it away from your face, then release 4 puffs into the air. This is called priming. 3. Shake the inhaler for 5 seconds. 4. Place  the open end of the spacer onto the inhaler mouthpiece. 5. Position the inhaler so the top of the canister faces up and the spacer mouthpiece faces you. 6. Put your index finger on the top of the medicine canister. Support the bottom of the inhaler and the spacer with your thumb. 7. Breathe out normally and as completely as possible, away from the spacer. 8. Place the spacer between your teeth and close your lips tightly around it. Keep your tongue down out of the way. 9. Press the canister down with your index finger to release the medicine, then inhale deeply and slowly through your mouth (not your nose) until your lungs are completely filled. Inhaling should take 4-6 seconds. 10. Hold the medicine in your lungs for 5-10 seconds (10 seconds is best). This helps the medicine get into the small airways of your lungs. 11. With your lips in a tight circle  (pursed), breathe out slowly. 12. Repeat steps 3-11 until you have taken the number of puffs that your health care provider directed. Wait about 1 minute between puffs or as directed. 13. Remove the spacer from the inhaler and put the cap on the inhaler. 14. If you are using a steroid inhaler, rinse your mouth with water, gargle, and spit out the water. Do not swallow the water. Follow these instructions at home:  Take your inhaled medicine only as told by your health care provider. Do not use the inhaler more than directed by your health care provider.  Keep all follow-up visits as told by your health care provider. This is important.  If your inhaler has a counter, you can check it to determine how full your inhaler is. If your inhaler does not have a counter, ask your health care provider when you will need to refill your inhaler and write the refill date on a calendar or on your inhaler canister. Note that you cannot know when an inhaler is empty by shaking it.  Follow directions on the package insert for care and cleaning of your inhaler and spacer. Contact a health care provider if:  Symptoms are only partially relieved with your inhaler.  You are having trouble using your inhaler.  You have an increase in phlegm.  You have headaches. Get help right away if:  You feel little or no relief after using your inhaler.  You have dizziness.  You have a fast heart rate.  You have chills or a fever.  You have night sweats.  There is blood in your phlegm. Summary  A metered dose inhaler is a handheld device for taking medicine that must be breathed into the lungs (inhaled).  The medicine is delivered by pushing down on a metal canister to release a preset amount of spray and medicine.  Each device contains the amount of medicine that is needed for a preset number of uses (inhalations). This information is not intended to replace advice given to you by your health care provider.  Make sure you discuss any questions you have with your health care provider. Document Released: 08/04/2005 Document Revised: 06/24/2016 Document Reviewed: 06/24/2016 Elsevier Interactive Patient Education  2017 [employer].

## 2017-04-28 ENCOUNTER — Other Ambulatory Visit: Payer: Self-pay | Admitting: Pulmonary Disease

## 2017-05-07 ENCOUNTER — Telehealth: Payer: Self-pay | Admitting: Family Medicine

## 2017-05-07 NOTE — Telephone Encounter (Signed)
Patient called and said she wanted to switch from Dr.Aron to [place].  Patient said she spoke to [place] and he agreed to see her and her daughter,Ashley. I let patient know we would have to schedule an appointment for patient to transfer from [place] to [place].  Patient said she saw Dr.Duncan the last couple times (a year ago) for acute issues.  She said she didn't want to make an appointment for transfer and pay $25.  I spoke to [facility] and asked her to speak to patient because she was refusing to schedule appointment. Call was transferred to [facility].

## 2017-05-07 NOTE — Telephone Encounter (Signed)
Spoke with patient, she stated "I have seen Dr. Damita Dunnings this year 3 times and I don't need to see him and pay $25 just to say hi."  Explained that Dr. Damita Dunnings will need to review her chart, specialists that she sees and medications to establish with him. Reviewed patient's chart, she has not seen Dr. Damita Dunnings since October 2017 (for acute issues only) and patient stated that Dr. Damita Dunnings had referred her to specialists, however all referrals have been made by Dr. Deborra Medina.  Patient continued to refuse to make the appointment and, she loudly asked my name and hung up the phone.

## 2017-05-08 NOTE — Telephone Encounter (Signed)
I am not her PCP.  I don't accept transfers without an OV.  I wish her the best.

## 2017-06-15 ENCOUNTER — Other Ambulatory Visit: Payer: Self-pay | Admitting: Family Medicine

## 2017-06-15 DIAGNOSIS — Z1231 Encounter for screening mammogram for malignant neoplasm of breast: Secondary | ICD-10-CM

## 2017-06-18 ENCOUNTER — Ambulatory Visit
Admission: RE | Admit: 2017-06-18 | Discharge: 2017-06-18 | Disposition: A | Discharge status: Home / Self Care | Payer: No Typology Code available for payment source | Source: Ambulatory Visit | Attending: Family Medicine | Admitting: Family Medicine

## 2017-06-18 DIAGNOSIS — Z1231 Encounter for screening mammogram for malignant neoplasm of breast: Secondary | ICD-10-CM

## 2017-07-17 LAB — LAB REPORT - SCANNED
A1C: 5.5
ALT: 19
AST: 19
CHOLESTEROL, TOTAL: 211
Creatinine, Ser: 0.78
HDL: 48
Hemoglobin: 13
LDL CHOLESTEROL (CALC): 114
TRIGLYCERIDES: 244
TSH: 2.73
Vitamin D 1, 25 (OH)2 Total: 34.5

## 2017-08-04 ENCOUNTER — Ambulatory Visit: Payer: Self-pay | Admitting: Registered Nurse

## 2017-08-04 VITALS — BP 121/79 | HR 73 | Temp 98.7°F

## 2017-08-04 DIAGNOSIS — J0101 Acute recurrent maxillary sinusitis: Secondary | ICD-10-CM

## 2017-08-04 DIAGNOSIS — J209 Acute bronchitis, unspecified: Secondary | ICD-10-CM

## 2017-08-04 MED ORDER — DOXYCYCLINE HYCLATE 100 MG PO TABS: 100.0000 mg | ORAL_TABLET | Freq: Two times a day (BID) | ORAL | 0 refills | Status: AC

## 2017-08-04 MED ORDER — ALBUTEROL SULFATE HFA 108 (90 BASE) MCG/ACT IN AERS: 1.0000 | INHALATION_SPRAY | RESPIRATORY_TRACT | 0 refills | Status: DC | PRN

## 2017-08-04 MED ORDER — SALINE SPRAY 0.65 % NA SOLN: 2.0000 | NASAL | 0 refills | Status: DC

## 2017-08-04 MED ORDER — PREDNISONE 10 MG (21) PO TBPK: ORAL_TABLET | ORAL | 0 refills | Status: DC

## 2017-08-04 MED ORDER — BENZONATATE 200 MG PO CAPS: 200.0000 mg | ORAL_CAPSULE | Freq: Three times a day (TID) | ORAL | 0 refills | Status: AC | PRN

## 2017-08-04 NOTE — Patient Instructions (Signed)
Acute Bronchitis, Adult Acute bronchitis is sudden (acute) swelling of the air tubes (bronchi) in the lungs. Acute bronchitis causes these tubes to fill with mucus, which can make it hard to breathe. It can also cause coughing or wheezing. In adults, acute bronchitis usually goes away within 2 weeks. A cough caused by bronchitis may last up to 3 weeks. Smoking, allergies, and asthma can make the condition worse. Repeated episodes of bronchitis may cause further lung problems, such as chronic obstructive pulmonary disease (COPD). What are the causes? This condition can be caused by germs and by substances that irritate the lungs, including:  Cold and flu viruses. This condition is most often caused by the same virus that causes a cold.  Bacteria.  Exposure to tobacco smoke, dust, fumes, and air pollution.  What increases the risk? This condition is more likely to develop in people who:  Have close contact with someone with acute bronchitis.  Are exposed to lung irritants, such as tobacco smoke, dust, fumes, and vapors.  Have a weak immune system.  Have a respiratory condition such as asthma.  What are the signs or symptoms? Symptoms of this condition include:  A cough.  Coughing up clear, yellow, or green mucus.  Wheezing.  Chest congestion.  Shortness of breath.  A fever.  Body aches.  Chills.  A sore throat.  How is this diagnosed? This condition is usually diagnosed with a physical exam. During the exam, your health care provider may order tests, such as chest X-rays, to rule out other conditions. He or she may also:  Test a sample of your mucus for bacterial infection.  Check the level of oxygen in your blood. This is done to check for pneumonia.  Do a chest X-ray or lung function testing to rule out pneumonia and other conditions.  Perform blood tests.  Your health care provider will also ask about your symptoms and medical history. How is this  treated? Most cases of acute bronchitis clear up over time without treatment. Your health care provider may recommend:  Drinking more fluids. Drinking more makes your mucus thinner, which may make it easier to breathe.  Taking a medicine for a fever or cough.  Taking an antibiotic medicine.  Using an inhaler to help improve shortness of breath and to control a cough.  Using a cool mist vaporizer or humidifier to make it easier to breathe.  Follow these instructions at home: Medicines  Take over-the-counter and prescription medicines only as told by your health care provider.  If you were prescribed an antibiotic, take it as told by your health care provider. Do not stop taking the antibiotic even if you start to feel better. General instructions  Get plenty of rest.  Drink enough fluids to keep your urine clear or pale yellow.  Avoid smoking and secondhand smoke. Exposure to cigarette smoke or irritating chemicals will make bronchitis worse. If you smoke and you need help quitting, ask your health care provider. Quitting smoking will help your lungs heal faster.  Use an inhaler, cool mist vaporizer, or humidifier as told by your health care provider.  Keep all follow-up visits as told by your health care provider. This is important. How is this prevented? To lower your risk of getting this condition again:  Wash your hands often with soap and water. If soap and water are not available, use hand sanitizer.  Avoid contact with people who have cold symptoms.  Try not to touch your hands to your   mouth, nose, or eyes.  Make sure to get the flu shot every year.  Contact a health care provider if:  Your symptoms do not improve in 2 weeks of treatment. Get help right away if:  You cough up blood.  You have chest pain.  You have severe shortness of breath.  You become dehydrated.  You faint or keep feeling like you are going to faint.  You keep vomiting.  You have a  severe headache.  Your fever or chills gets worse. This information is not intended to replace advice given to you by your health care provider. Make sure you discuss any questions you have with your health care provider. Document Released: 09/11/2004 Document Revised: 02/27/2016 Document Reviewed: 01/23/2016 Elsevier Interactive Patient Education  2017 [employer] Sinusitis, Adult Sinusitis is soreness and inflammation of your sinuses. Sinuses are hollow spaces in the bones around your face. Your sinuses are located:  Around your eyes.  In the middle of your forehead.  Behind your nose.  In your cheekbones.  Your sinuses and nasal passages are lined with a stringy fluid (mucus). Mucus normally drains out of your sinuses. When your nasal tissues become inflamed or swollen, the mucus can become trapped or blocked so air cannot flow through your sinuses. This allows bacteria, viruses, and funguses to grow, which leads to infection. Sinusitis can develop quickly and last for 7?10 days (acute) or for more than 12 weeks (chronic). Sinusitis often develops after a cold. What are the causes? This condition is caused by anything that creates swelling in the sinuses or stops mucus from draining, including:  Allergies.  Asthma.  Bacterial or viral infection.  Abnormally shaped bones between the nasal passages.  Nasal growths that contain mucus (nasal polyps).  Narrow sinus openings.  Pollutants, such as chemicals or irritants in the air.  A foreign object stuck in the nose.  A fungal infection. This is rare.  What increases the risk? The following factors may make you more likely to develop this condition:  Having allergies or asthma.  Having had a recent cold or respiratory tract infection.  Having structural deformities or blockages in your nose or sinuses.  Having a weak immune system.  Doing a lot of swimming or diving.  Overusing nasal sprays.  Smoking.  What  are the signs or symptoms? The main symptoms of this condition are pain and a feeling of pressure around the affected sinuses. Other symptoms include:  Upper toothache.  Earache.  Headache.  Bad breath.  Decreased sense of smell and taste.  A cough that may get worse at night.  Fatigue.  Fever.  Thick drainage from your nose. The drainage is often green and it may contain pus (purulent).  Stuffy nose or congestion.  Postnasal drip. This is when extra mucus collects in the throat or back of the nose.  Swelling and warmth over the affected sinuses.  Sore throat.  Sensitivity to light.  How is this diagnosed? This condition is diagnosed based on symptoms, a medical history, and a physical exam. To find out if your condition is acute or chronic, your health care provider may:  Look in your nose for signs of nasal polyps.  Tap over the affected sinus to check for signs of infection.  View the inside of your sinuses using an imaging device that has a light attached (endoscope).  If your health care provider suspects that you have chronic sinusitis, you may also:  Be tested for allergies.  Have  a sample of mucus taken from your nose (nasal culture) and checked for bacteria.  Have a mucus sample examined to see if your sinusitis is related to an allergy.  If your sinusitis does not respond to treatment and it lasts longer than 8 weeks, you may have an MRI or CT scan to check your sinuses. These scans also help to determine how severe your infection is. In rare cases, a bone biopsy may be done to rule out more serious types of fungal sinus disease. How is this treated? Treatment for sinusitis depends on the cause and whether your condition is chronic or acute. If a virus is causing your sinusitis, your symptoms will go away on their own within 10 days. You may be given medicines to relieve your symptoms, including:  Topical nasal decongestants. They shrink swollen nasal  passages and let mucus drain from your sinuses.  Antihistamines. These drugs block inflammation that is triggered by allergies. This can help to ease swelling in your nose and sinuses.  Topical nasal corticosteroids. These are nasal sprays that ease inflammation and swelling in your nose and sinuses.  Nasal saline washes. These rinses can help to get rid of thick mucus in your nose.  If your condition is caused by bacteria, you will be given an antibiotic medicine. If your condition is caused by a fungus, you will be given an antifungal medicine. Surgery may be needed to correct underlying conditions, such as narrow nasal passages. Surgery may also be needed to remove polyps. Follow these instructions at home: Medicines  Take, use, or apply over-the-counter and prescription medicines only as told by your health care provider. These may include nasal sprays.  If you were prescribed an antibiotic medicine, take it as told by your health care provider. Do not stop taking the antibiotic even if you start to feel better. Hydrate and Humidify  Drink enough water to keep your urine clear or pale yellow. Staying hydrated will help to thin your mucus.  Use a cool mist humidifier to keep the humidity level in your home above 50%.  Inhale steam for 10-15 minutes, 3-4 times a day or as told by your health care provider. You can do this in the bathroom while a hot shower is running.  Limit your exposure to cool or dry air. Rest  Rest as much as possible.  Sleep with your head raised (elevated).  Make sure to get enough sleep each night. General instructions  Apply a warm, moist washcloth to your face 3-4 times a day or as told by your health care provider. This will help with discomfort.  Wash your hands often with soap and water to reduce your exposure to viruses and other germs. If soap and water are not available, use hand sanitizer.  Do not smoke. Avoid being around people who are  smoking (secondhand smoke).  Keep all follow-up visits as told by your health care provider. This is important. Contact a health care provider if:  You have a fever.  Your symptoms get worse.  Your symptoms do not improve within 10 days. Get help right away if:  You have a severe headache.  You have persistent vomiting.  You have pain or swelling around your face or eyes.  You have vision problems.  You develop confusion.  Your neck is stiff.  You have trouble breathing. This information is not intended to replace advice given to you by your health care provider. Make sure you discuss any questions you have  with your health care provider. Document Released: 08/04/2005 Document Revised: 03/30/2016 Document Reviewed: 05/30/2015 Elsevier Interactive Patient Education  2017 [employer] Sinus Rinse What is a sinus rinse? A sinus rinse is a simple home treatment that is used to rinse your sinuses with a sterile mixture of salt and water (saline solution). Sinuses are air-filled spaces in your skull behind the bones of your face and forehead that open into your nasal cavity. You will use the following:  Saline solution.  Neti pot or spray bottle. This releases the saline solution into your nose and through your sinuses. Neti pots and spray bottles can be purchased at [employer], a health food store, or online.  When would I do a sinus rinse? A sinus rinse can help to clear mucus, dirt, dust, or pollen from the nasal cavity. You may do a sinus rinse when you have a cold, a virus, nasal allergy symptoms, a sinus infection, or stuffiness in the nose or sinuses. If you are considering a sinus rinse:  Ask your child's health care provider before performing a sinus rinse on your child.  Do not do a sinus rinse if you have had ear or nasal surgery, ear infection, or blocked ears.  How do I do a sinus rinse?  Wash your hands.  Disinfect your device according to the  directions provided and then dry it.  Use the solution that comes with your device or one that is sold separately in stores. Follow the mixing directions on the package.  Fill your device with the amount of saline solution as directed by the device instructions.  Stand over a sink and tilt your head sideways over the sink.  Place the spout of the device in your upper nostril (the one closer to the ceiling).  Gently pour or squeeze the saline solution into the nasal cavity. The liquid should drain to the lower nostril if you are not overly congested.  Gently blow your nose. Blowing too hard may cause ear pain.  Repeat in the other nostril.  Clean and rinse your device with clean water and then air-dry it. Are there risks of a sinus rinse? Sinus rinse is generally very safe and effective. However, there are a few risks, which include:  A burning sensation in the sinuses. This may happen if you do not make the saline solution as directed. Make sure to follow all directions when making the saline solution.  Infection from contaminated water. This is rare, but possible.  Nasal irritation.  This information is not intended to replace advice given to you by your health care provider. Make sure you discuss any questions you have with your health care provider. Document Released: 03/01/2014 Document Revised: 07/01/2016 Document Reviewed: 12/20/2013 Elsevier Interactive Patient Education  2017 [employer].

## 2017-08-04 NOTE — Progress Notes (Signed)
Subjective:    Patient ID: [name], female    DOB: August 13, 1961, 56 y.o.   MRN: [id number]  56y/o caucasian female established Pt reports frontal and maxillary sinus pain and pressure, L otalgia, nasal congestion, intermittent wheezing for the past couple of days.  Her sons home from college and sick.  She has also been exposed to sick coworkers.  Tried nasal saline without relief. Continued cough x1.5 yrs that has been worked up multiple times with no conclusive Dx. Coughed up some phlegm today.  Has inhaler at home using prn.  Would like refill of tessalon pearles and flonase has worked well for her in the past.  Using nasal saline.        Review of Systems  Constitutional: Negative for activity change, appetite change, chills, diaphoresis, fatigue, fever and unexpected weight change.  HENT: Positive for congestion, ear pain, postnasal drip, rhinorrhea, sinus pressure and sinus pain. Negative for dental problem, drooling, ear discharge, facial swelling, hearing loss, mouth sores, nosebleeds, sneezing, sore throat, tinnitus, trouble swallowing and voice change.   Eyes: Negative for photophobia, pain, discharge, redness, itching and visual disturbance.  Respiratory: Positive for cough and wheezing. Negative for choking, chest tightness, shortness of breath and stridor.   Cardiovascular: Negative for chest pain, palpitations and leg swelling.  Gastrointestinal: Negative for abdominal distention, abdominal pain, blood in stool, constipation, diarrhea, nausea and vomiting.  Endocrine: Negative for cold intolerance and heat intolerance.  Genitourinary: Negative for difficulty urinating, dysuria and hematuria.  Musculoskeletal: Negative for arthralgias, back pain, gait problem, joint swelling, myalgias, neck pain and neck stiffness.  Skin: Negative for color change, pallor, rash and wound.  Allergic/Immunologic: Positive for environmental allergies. Negative for food allergies.  Neurological:  Positive for headaches. Negative for dizziness, tremors, seizures, syncope, facial asymmetry, speech difficulty, weakness, light-headedness and numbness.  Hematological: Negative for adenopathy. Does not bruise/bleed easily.  Psychiatric/Behavioral: Negative for agitation, behavioral problems, confusion and sleep disturbance.       Objective:   Physical Exam  Constitutional: She is oriented to person, place, and time. Vital signs are normal. She appears well-developed and well-nourished. She is active and cooperative.  Non-toxic appearance. She does not have a sickly appearance. She appears ill. No distress.  HENT:  Head: Normocephalic and atraumatic.  Right Ear: Hearing, external ear and ear canal normal. A middle ear effusion is present.  Left Ear: Hearing, external ear and ear canal normal. A middle ear effusion is present.  Nose: Mucosal edema and rhinorrhea present. No nose lacerations, sinus tenderness, nasal deformity, septal deviation or nasal septal hematoma. No epistaxis.  No foreign bodies. Right sinus exhibits maxillary sinus tenderness and frontal sinus tenderness. Left sinus exhibits maxillary sinus tenderness and frontal sinus tenderness.  Mouth/Throat: Uvula is midline and mucous membranes are normal. Mucous membranes are not pale, not dry and not cyanotic. She does not have dentures. No oral lesions. No trismus in the jaw. Normal dentition. No dental abscesses, uvula swelling, lacerations or dental caries. Posterior oropharyngeal edema and posterior oropharyngeal erythema present. No oropharyngeal exudate or tonsillar abscesses.  Maxillary greater than frontal sinuses TTP bilaterally; cobblestoning posterior pharynx; bilateral TMs air fluid level clear; bilateral nasal turbinates edema/erythema clear discharge  Eyes: Conjunctivae, EOM and lids are normal. Pupils are equal, round, and reactive to light. Right eye exhibits no chemosis, no discharge, no exudate and no hordeolum. No  foreign body present in the right eye. Left eye exhibits no chemosis, no discharge, no exudate and no hordeolum.  No foreign body present in the left eye. Right conjunctiva is not injected. Right conjunctiva has no hemorrhage. Left conjunctiva is not injected. Left conjunctiva has no hemorrhage. No scleral icterus. Right eye exhibits normal extraocular motion and no nystagmus. Left eye exhibits normal extraocular motion and no nystagmus. Right pupil is round and reactive. Left pupil is round and reactive. Pupils are equal.  Neck: Trachea normal, normal range of motion and phonation normal. Neck supple. No tracheal tenderness and no muscular tenderness present. No neck rigidity. No tracheal deviation, no edema, no erythema and normal range of motion present. No thyroid mass and no thyromegaly present.  Cardiovascular: Normal rate, regular rhythm, S1 normal, S2 normal, normal heart sounds and intact distal pulses. PMI is not displaced. Exam reveals no gallop and no friction rub.  No murmur heard. Pulmonary/Chest: Effort normal. No accessory muscle usage or stridor. No respiratory distress. She has no decreased breath sounds. She has wheezes in the left lower field. She has no rhonchi. She has no rales. She exhibits no tenderness.  Spoke full sentences without difficulty; intermittent nonproductive cough observed in exam room; intermittent expiratory wheeze noted LLL  Abdominal: Soft. Normal appearance. She exhibits no distension, no fluid wave and no ascites. There is no rigidity and no guarding.  Musculoskeletal: Normal range of motion. She exhibits no edema or tenderness.       Right shoulder: Normal.       Left shoulder: Normal.       Right elbow: Normal.      Left elbow: Normal.       Right hip: Normal.       Left hip: Normal.       Right knee: Normal.       Left knee: Normal.       Cervical back: Normal.       Thoracic back: Normal.       Lumbar back: Normal.       Right hand: Normal.        Left hand: Normal.  Lymphadenopathy:       Head (right side): No submental, no submandibular, no tonsillar, no preauricular, no posterior auricular and no occipital adenopathy present.       Head (left side): No submental, no submandibular, no tonsillar, no preauricular, no posterior auricular and no occipital adenopathy present.    She has no cervical adenopathy.       Right cervical: No superficial cervical, no deep cervical and no posterior cervical adenopathy present.      Left cervical: No superficial cervical, no deep cervical and no posterior cervical adenopathy present.  Neurological: She is alert and oriented to person, place, and time. She has normal strength. She is not disoriented. She displays no atrophy and no tremor. No cranial nerve deficit or sensory deficit. She exhibits normal muscle tone. She displays no seizure activity. Coordination and gait normal. GCS eye subscore is 4. GCS verbal subscore is 5. GCS motor subscore is 6.  On/off exam table; in/out of chair without difficulty; gait sure and steady hallway  Skin: Skin is warm, dry and intact. No abrasion, no bruising, no burn, no ecchymosis, no laceration, no lesion, no petechiae and no rash noted. She is not diaphoretic. No cyanosis or erythema. No pallor. Nails show no clubbing.  Psychiatric: She has a normal mood and affect. Her speech is normal and behavior is normal. Judgment and thought content normal. Cognition and memory are normal.  Nursing note and vitals reviewed.  Assessment & Plan:  A-recurrent maxillary sinusitis acute, acute bronchitis  P-Continue flonase 2 spray each nostril daily refilled from [facility] dispensed to patient, saline 2 sprays each nostril q2h wa prn congestion 1 bottle given from clinic stock.  If no improvement with 48 hours of saline and flonase use start doxycycline 100mg  po BID x 10 days #20 RF0 dispensed from [facility] to patient. Denied personal or family history of ENT cancer.  Shower BID  especially prior to bed. No evidence of systemic bacterial infection, non toxic and well hydrated.  I do not see where any further testing or imaging is necessary at this time.   I will suggest supportive care, rest, good hygiene and encourage the patient to take adequate fluids.  The patient is to return to clinic or EMERGENCY ROOM if symptoms worsen or change significantly.  Exitcare handout on sinusitis and sinus rinse given to patient.  Patient verbalized agreement and understanding of treatment plan and had no further questions at this time.   P2:  Hand washing and cover cough  Rx Doxycycline 100mg  po BID x 10 days #20 RF0 dispensed from [facility] for patient sinusitis also covers atypical bronchitis. Discussed to use sunscreen/protective clothing as increased risk of sunburn and most common side effect stomach upset take medication with food. Cough lozenges po q2h prn cough given 8 UD from clinic stock. Tessalon pearles 200mg  po TID prn cough #30 RF0 discussed with patient appears to have refills from primary care also. Prednisone taper 10mg  (60/50/40/30/20/10mg ) po daily with breakfast #21 RF0 Electronic Rx to patient pharmacy of choice start if no improvement after course of doxycycline for sinusitis with flonase and saline or if using albuterol more than twice a day.  Discussed possible side effects increased/decreased appetite, difficulty sleeping, increased blood sugar, increased blood pressure and heart rate.  Refilled Albuterol MDI 71mcg 1-2 puffs po q4-6h prn protracted cough/wheeze #1 RF0 electronic Rx to patient pharmacy of choice; possible side effect increased heart rate. Bronchitis simple, community acquired, may have started as viral (probably respiratory syncytial, parainfluenza, influenza, or adenovirus), but now evidence of acute purulent bronchitis with resultant bronchial edema and mucus formation.  Viruses are the most common cause of bronchial inflammation in otherwise healthy adults with  acute bronchitis.  The appearance of sputum is not predictive of whether a bacterial infection is present.  Purulent sputum is most often caused by viral infections.  There are a small portion of those caused by non-viral agents being Mycoplama pneumonia.  Microscopic examination or C&S of sputum in the healthy adult with acute bronchitis is generally not helpful (usually negative or normal respiratory flora) other considerations being cough from upper respiratory tract infections, sinusitis or allergic syndromes (mild asthma or viral pneumonia).  Differential Diagnoses:  reactive airway disease (asthma, allergic aspergillosis (eosinophilia), chronic bronchitis, respiratory infection (sinusitis, common cold, pneumonia), congestive heart failure, reflux esophagitis, bronchogenic tumor, aspiration syndromes and/or exposure to pulmonary irritants/smoke.  Without high fever, severe dyspnea, lack of physical findings or other risk factors, I will hold on a chest radiograph and CBC at this time.  I discussed that approximately 50% of patients with acute bronchitis have a cough that lasts up to three weeks, and 25% for over a month.  Tylenol 500mg  one to two tablets every four to six hours as needed for fever or myalgias.  No aspirin. Exitcare handout on bronchitis and inhaler use given to patient.  ER if hemopthysis, SOB, worst chest pain of life.   Patient instructed  to follow up in one week or sooner if symptoms worsen.  Patient verbalized agreement and understanding of treatment plan.  P2:  hand washing and cover cough

## 2017-09-11 ENCOUNTER — Encounter: Payer: Self-pay | Admitting: Family Medicine

## 2017-09-11 ENCOUNTER — Ambulatory Visit: Payer: No Typology Code available for payment source | Admitting: Family Medicine

## 2017-09-11 VITALS — BP 142/84 | HR 76 | Temp 98.3°F | Ht 67.0 in | Wt 257.8 lb

## 2017-09-11 DIAGNOSIS — N951 Menopausal and female climacteric states: Secondary | ICD-10-CM | POA: Diagnosis not present

## 2017-09-11 DIAGNOSIS — R05 Cough: Secondary | ICD-10-CM | POA: Diagnosis not present

## 2017-09-11 DIAGNOSIS — G629 Polyneuropathy, unspecified: Secondary | ICD-10-CM

## 2017-09-11 DIAGNOSIS — I10 Essential (primary) hypertension: Secondary | ICD-10-CM

## 2017-09-11 DIAGNOSIS — E038 Other specified hypothyroidism: Secondary | ICD-10-CM

## 2017-09-11 DIAGNOSIS — Z7189 Other specified counseling: Secondary | ICD-10-CM | POA: Diagnosis not present

## 2017-09-11 DIAGNOSIS — R059 Cough, unspecified: Secondary | ICD-10-CM

## 2017-09-11 MED ORDER — HYDROCHLOROTHIAZIDE 12.5 MG PO CAPS: ORAL_CAPSULE | ORAL | 3 refills | Status: DC

## 2017-09-11 MED ORDER — FLUTICASONE PROPIONATE 50 MCG/ACT NA SUSP: 2.0000 | Freq: Every day | NASAL | 3 refills | Status: DC

## 2017-09-11 MED ORDER — GABAPENTIN 100 MG PO CAPS: ORAL_CAPSULE | ORAL | 3 refills | Status: DC

## 2017-09-11 MED ORDER — GEMFIBROZIL 600 MG PO TABS: 600.0000 mg | ORAL_TABLET | Freq: Two times a day (BID) | ORAL | 3 refills | Status: DC

## 2017-09-11 MED ORDER — ARMOUR THYROID 30 MG PO TABS: 30.0000 mg | ORAL_TABLET | Freq: Every day | ORAL | 1 refills | Status: DC

## 2017-09-11 MED ORDER — ESTROGENS CONJUGATED 0.45 MG PO TABS: 0.4500 mg | ORAL_TABLET | Freq: Every day | ORAL | 3 refills | Status: DC

## 2017-09-11 NOTE — Progress Notes (Signed)
On doxy for about 10 days with prev dx sinus infection. Cough is ongoing.  On symbicort and singulair w/o prn SABA.  SABA clearly helps the cough.   She has seen pulmonary about cough in the past.    HRT per prev PCP.  Still on HRT.  No ADE on med.  D/w pt about slow taper.  She hasn't tried taper in the past.    She is working on weight loss.  She isn't on diet supplement.  She is down from ~290 to 257 today. D/w pt about diet and exercise.   On thyroid replacement with prev TSH wnl.    Living will d/w pt.  Would have her husband designated if patient were incapacitated.   Hypertension:    Using medication without problems or lightheadedness: yes Chest pain with exertion:no Edema:no Short of breath:no  Neuropathy.  She has B symmetric distal foot pain improved with gabapentin.  No ADE on med.  Pain clearly worse if she misses a day of med.  No hand sx.  No weakness.   She has had labs done by outside clinic and she'll get me a copy.  D/w pt.    PMH and SH reviewed  ROS: Per HPI unless specifically indicated in ROS section   Meds, vitals, and allergies reviewed.   GEN: nad, alert and oriented HEENT: mucous membranes moist NECK: supple w/o LA CV: rrr. PULM: ctab, no inc wob ABD: soft, +bs EXT: no edema SKIN: no acute rash

## 2017-09-11 NOTE — Patient Instructions (Addendum)
I'll check your old records.  Please get me a copy of your most recent labs from [facility].   It would be a good idea to get a flu shot when you are done with the current antibiotics.   Start to taper your premarin.  Alternate the higher and lower dose then change to the lower dose daily.

## 2017-09-13 DIAGNOSIS — I10 Essential (primary) hypertension: Secondary | ICD-10-CM | POA: Insufficient documentation

## 2017-09-13 DIAGNOSIS — Z7189 Other specified counseling: Secondary | ICD-10-CM | POA: Insufficient documentation

## 2017-09-13 NOTE — Assessment & Plan Note (Signed)
Living will d/w pt. Would have her husband designated if patient were incapacitated. 

## 2017-09-13 NOTE — Assessment & Plan Note (Signed)
BP minimally elevated.  No change in meds.  She is working on diet and weight loss gradually.

## 2017-09-13 NOTE — Assessment & Plan Note (Signed)
She has had labs done by outside clinic and she'll get me a copy.  D/w pt.   I'll review old notes. No change in gabapentin at this point.  Gabapentin helps per patient report, no ADE on med.

## 2017-09-13 NOTE — Assessment & Plan Note (Signed)
On thyroid replacement with prev TSH wnl.  She has had labs done by outside clinic and she'll get me a copy.  D/w pt.   No change in armour thyroid at this point.

## 2017-09-14 NOTE — Assessment & Plan Note (Signed)
Start to taper premarin.  Alternate the higher and lower dose (0.625 vs 0.45 alternating daily) then change to the lower dose daily.  We can continue taper as time goes along. D/w pt, she agrees.

## 2017-09-14 NOTE — Assessment & Plan Note (Signed)
D/w pt.  Ctab.  I'll check old records to see if I have anything to add.  She agrees.  >25 minutes spent in face to face time with patient, >50% spent in counselling or coordination of care, discussing cough, HRT, neuropathy, hypothyroidism, etc.

## 2017-09-15 ENCOUNTER — Encounter: Payer: Self-pay | Admitting: Family Medicine

## 2017-09-21 ENCOUNTER — Other Ambulatory Visit (INDEPENDENT_AMBULATORY_CARE_PROVIDER_SITE_OTHER): Payer: No Typology Code available for payment source

## 2017-09-21 ENCOUNTER — Other Ambulatory Visit: Payer: Self-pay | Admitting: Family Medicine

## 2017-09-21 ENCOUNTER — Telehealth: Payer: Self-pay | Admitting: Family Medicine

## 2017-09-21 DIAGNOSIS — G629 Polyneuropathy, unspecified: Secondary | ICD-10-CM | POA: Diagnosis not present

## 2017-09-21 NOTE — Telephone Encounter (Signed)
Call pt.  Saw outside labs.  Needs B12 done re: neuropathy.  Ordered.  Needs nonfasting lab visit.   Vit D was improved.  Continue work on diet and exercise re: lipids.  Thanks.

## 2017-09-21 NOTE — Telephone Encounter (Signed)
Patient advised.  Lab appt scheduled.  

## 2017-09-22 LAB — VITAMIN B12

## 2017-09-25 ENCOUNTER — Encounter: Payer: Self-pay | Admitting: Family Medicine

## 2017-09-27 ENCOUNTER — Other Ambulatory Visit: Payer: Self-pay | Admitting: Family Medicine

## 2017-09-27 DIAGNOSIS — G629 Polyneuropathy, unspecified: Secondary | ICD-10-CM

## 2017-10-01 ENCOUNTER — Other Ambulatory Visit: Payer: Self-pay | Admitting: Registered Nurse

## 2017-11-03 ENCOUNTER — Ambulatory Visit: Payer: Self-pay | Admitting: Registered Nurse

## 2017-11-03 ENCOUNTER — Encounter: Payer: Self-pay | Admitting: Registered Nurse

## 2017-11-03 VITALS — BP 118/80 | HR 69 | Temp 97.4°F

## 2017-11-03 DIAGNOSIS — H6692 Otitis media, unspecified, left ear: Secondary | ICD-10-CM

## 2017-11-03 DIAGNOSIS — J0101 Acute recurrent maxillary sinusitis: Secondary | ICD-10-CM

## 2017-11-03 MED ORDER — AMOXICILLIN-POT CLAVULANATE 875-125 MG PO TABS
1.0000 | ORAL_TABLET | Freq: Two times a day (BID) | ORAL | 0 refills | Status: DC
Start: 2017-11-03 — End: 2017-12-01

## 2017-11-03 NOTE — Progress Notes (Signed)
Subjective:    Patient ID: [name], female    DOB: May 01, 1961, 57 y.o.   MRN: [id number]  57y/o caucasian female established Pt c/o maxillary sinus pain/pressure, upper teeth pain, bil otalgia L>R, nasal congestion, rhinorrhea, PND causing sore throat. Cough present but unchanged from 1.5 year persistent cough that she has had evaluated multiple times. Has been using Mucinex, sinus rinses saline, singulair, flonase, symbicort, phenylephrine at home with short term relief. Both her college age children have been recently sick when home on spring break also.  + sick contacts at work      Review of Systems  Constitutional: Negative for activity change, appetite change, chills, diaphoresis, fatigue, fever and unexpected weight change.  HENT: Positive for congestion, ear pain, postnasal drip, rhinorrhea, sinus pressure, sinus pain and sore throat. Negative for dental problem, drooling, ear discharge, facial swelling, hearing loss, mouth sores, nosebleeds, sneezing, tinnitus, trouble swallowing and voice change.   Eyes: Negative for photophobia, pain, discharge, redness, itching and visual disturbance.  Respiratory: Positive for cough. Negative for choking, chest tightness, shortness of breath, wheezing and stridor.   Cardiovascular: Negative for chest pain, palpitations and leg swelling.  Gastrointestinal: Negative for abdominal distention, abdominal pain, blood in stool, constipation, diarrhea, nausea and vomiting.  Endocrine: Negative for cold intolerance and heat intolerance.  Genitourinary: Negative for difficulty urinating, dysuria and hematuria.  Musculoskeletal: Negative for arthralgias, back pain, gait problem, joint swelling, myalgias, neck pain and neck stiffness.  Skin: Negative for color change, pallor, rash and wound.  Allergic/Immunologic: Positive for environmental allergies. Negative for food allergies.  Neurological: Positive for headaches. Negative for dizziness, tremors,  seizures, syncope, facial asymmetry, speech difficulty, weakness, light-headedness and numbness.  Hematological: Negative for adenopathy. Does not bruise/bleed easily.  Psychiatric/Behavioral: Negative for agitation, behavioral problems, confusion and sleep disturbance.       Objective:   Physical Exam  Constitutional: She is oriented to person, place, and time. Vital signs are normal. She appears well-developed and well-nourished. She is active and cooperative.  Non-toxic appearance. She does not have a sickly appearance. She appears ill. No distress.  HENT:  Head: Normocephalic and atraumatic.  Right Ear: Hearing, external ear and ear canal normal. A middle ear effusion is present.  Left Ear: Hearing, external ear and ear canal normal. Tympanic membrane is erythematous and bulging. A middle ear effusion is present.  Nose: Mucosal edema and rhinorrhea present. No nose lacerations, sinus tenderness, nasal deformity, septal deviation or nasal septal hematoma. No epistaxis.  No foreign bodies. Right sinus exhibits maxillary sinus tenderness and frontal sinus tenderness. Left sinus exhibits maxillary sinus tenderness and frontal sinus tenderness.  Mouth/Throat: Uvula is midline and mucous membranes are normal. Mucous membranes are not pale, not dry and not cyanotic. She does not have dentures. No oral lesions. No trismus in the jaw. Normal dentition. No dental abscesses, uvula swelling, lacerations or dental caries. Posterior oropharyngeal edema and posterior oropharyngeal erythema present. No oropharyngeal exudate or tonsillar abscesses.  Maxillary greater than frontal sinuses pain and TTP; left TM air fluid level clear centrally erythema bulging; right TM air fluid level clear; cobblestoning posterior pharynx; bilateral lower eyelids edema/allergic shiners; bilateral nasal turbinates clear discharge  Eyes: Conjunctivae, EOM and lids are normal. Pupils are equal, round, and reactive to light. Right eye  exhibits no chemosis, no discharge, no exudate and no hordeolum. No foreign body present in the right eye. Left eye exhibits no chemosis, no discharge, no exudate and no hordeolum. No foreign body  present in the left eye. Right conjunctiva is not injected. Right conjunctiva has no hemorrhage. Left conjunctiva is not injected. Left conjunctiva has no hemorrhage. No scleral icterus. Right eye exhibits normal extraocular motion and no nystagmus. Left eye exhibits normal extraocular motion and no nystagmus. Right pupil is round and reactive. Left pupil is round and reactive. Pupils are equal.  Neck: Trachea normal, normal range of motion and phonation normal. Neck supple. No tracheal tenderness and no muscular tenderness present. No neck rigidity. No tracheal deviation, no edema, no erythema and normal range of motion present. No thyroid mass and no thyromegaly present.  Cardiovascular: Normal rate, regular rhythm, S1 normal, S2 normal, normal heart sounds and intact distal pulses. PMI is not displaced. Exam reveals no gallop, no distant heart sounds and no friction rub.  No murmur heard. Pulmonary/Chest: Effort normal and breath sounds normal. No accessory muscle usage or stridor. No respiratory distress. She has no decreased breath sounds. She has no wheezes. She has no rhonchi. She has no rales. She exhibits no tenderness.  Intermittent nonproductive cough in exam room; spoke full sentences without difficulty  Abdominal: Soft. Normal appearance. She exhibits no distension, no fluid wave and no ascites. There is no rigidity and no guarding.  Musculoskeletal: Normal range of motion. She exhibits no edema or tenderness.       Right shoulder: Normal.       Left shoulder: Normal.       Right elbow: Normal.      Left elbow: Normal.       Right hip: Normal.       Left hip: Normal.       Right knee: Normal.       Left knee: Normal.       Cervical back: Normal.       Thoracic back: Normal.       Lumbar back:  Normal.       Right hand: Normal.       Left hand: Normal.  Lymphadenopathy:       Head (right side): No submental, no submandibular, no tonsillar, no preauricular, no posterior auricular and no occipital adenopathy present.       Head (left side): No submental, no submandibular, no tonsillar, no preauricular, no posterior auricular and no occipital adenopathy present.    She has no cervical adenopathy.       Right cervical: No superficial cervical, no deep cervical and no posterior cervical adenopathy present.      Left cervical: No superficial cervical, no deep cervical and no posterior cervical adenopathy present.  Neurological: She is alert and oriented to person, place, and time. She has normal strength. She is not disoriented. She displays no atrophy and no tremor. No cranial nerve deficit or sensory deficit. She exhibits normal muscle tone. She displays no seizure activity. Coordination and gait normal. GCS eye subscore is 4. GCS verbal subscore is 5. GCS motor subscore is 6.  In/out of chair/on/off exam table without difficulty; gait sure and steady in hallway  Skin: Skin is warm, dry and intact. No abrasion, no bruising, no burn, no ecchymosis, no laceration, no lesion, no petechiae and no rash noted. She is not diaphoretic. No cyanosis or erythema. No pallor. Nails show no clubbing.  Psychiatric: She has a normal mood and affect. Her speech is normal and behavior is normal. Judgment and thought content normal. Cognition and memory are normal.  Nursing note and vitals reviewed.  Assessment & Plan:  A-Left otitis media acute with effusion, acute maxillary recurrent sinusitis, chronic bronchitis  P-Last amoxicillin jul 2017; doxycycline x 2 2018, nasal saline 2 sprays each nostril once a day in shower increase to QID given 1 bottle from clinic stock to use at work; more aggressive use/flow in shower; augmentin 875mg  po BID x 10 days #20 RF0 dispensed from [facility], restart tessalon  pearles po TID at home prn cough; use albuterol 1-2 puffs po q4-6h prn wheeze/protracted cough in middle of day continue symbicort am/pm and singulair 10mg  po qhs.  Continue flonase 1 spray each nostril BID at home.  Discussed with patient if no improvement after we get through spring allergy season consider CXR currently PND drip probably cause of cough and no wheeze/rhonchi/rales today on auscultation  Denied history of seizures/epilepsy personal or family. discussed amoxicillin levels decreased with prilosec use and amoxicillin lowers seizure threshhold with neurontin.  Denied personal or family history of ENT cancer.  Shower BID especially prior to bed. No evidence of systemic bacterial infection, non toxic and well hydrated.  I do not see where any further testing or imaging is necessary at this time.   I will suggest supportive care, rest, good hygiene and encourage the patient to take adequate fluids.  The patient is to return to clinic or EMERGENCY ROOM if symptoms worsen or change significantly.  Exitcare handout on sinusitis and sinus rinse given to patient.  Patient verbalized agreement and understanding of treatment plan and had no further questions at this time.   P2:  Hand washing and cover cough  Augmentin 875mg  po BID x 10 days #20 RF0 dispensed from [facility].  Supportive treatment.   No evidence of invasive bacterial infection, non toxic and well hydrated.  This is most likely self limiting viral infection.  I do not see where any further testing or imaging is necessary at this time.   I will suggest supportive care, rest, good hygiene and encourage the patient to take adequate fluids.  The patient is to return to clinic or EMERGENCY ROOM if symptoms worsen or change significantly e.g. ear pain, fever, purulent discharge from ears or bleeding.  Exitcare handout on otitis media  given to patient.  Patient verbalized agreement and understanding of treatment plan.     Tessalon pearles po TID prn  cough.  Consider not taking mucinex prior to bedtime as patient not having chest congestion and discussed with patient mucinex plain could be increasing cough after taking it.  Cough lozenges po q2h prn cough given 8 UD from clinic stock. Albuterol MDI 79mcg 1-2 puffs po q4-6h prn protracted cough/wheeze at home side effect increased heart rate, jitteryness, trouble sleeping. Bronchitis simple, community acquired, may have started as viral (probably respiratory syncytial, parainfluenza, influenza, or adenovirus), but now evidence of acute purulent bronchitis with resultant bronchial edema and mucus formation.  Viruses are the most common cause of bronchial inflammation in otherwise healthy adults with acute bronchitis.  The appearance of sputum is not predictive of whether a bacterial infection is present.  Purulent sputum is most often caused by viral infections.  There are a small portion of those caused by non-viral agents being Mycoplama pneumonia.  Microscopic examination or C&S of sputum in the healthy adult with acute bronchitis is generally not helpful (usually negative or normal respiratory flora) other considerations being cough from upper respiratory tract infections, sinusitis or allergic syndromes (mild asthma or viral pneumonia).  Differential Diagnoses:  reactive airway disease (asthma, allergic  aspergillosis (eosinophilia), chronic bronchitis, respiratory infection (sinusitis, common cold, pneumonia), congestive heart failure, reflux esophagitis, bronchogenic tumor, aspiration syndromes and/or exposure to pulmonary irritants/smoke.  Without high fever, severe dyspnea, lack of physical findings or other risk factors, I will hold on a chest radiograph and CBC at this time.  I discussed that approximately 50% of patients with acute bronchitis have a cough that lasts up to three weeks, and 25% for over a month.  Tylenol 500mg  one to two tablets every four to six hours as needed for fever or myalgias.  No  aspirin. Exitcare handout on bronchitis and inhaler use given to patient.  ER if hemopthysis, SOB, worst chest pain of life.   Patient instructed to follow up in four weeks or sooner if symptoms worsen.  Patient verbalized agreement and understanding of treatment plan and had no further questions at this time.  P2:  hand washing and cover cough

## 2017-11-03 NOTE — Patient Instructions (Signed)
Otitis Media, Adult Otitis media occurs when there is inflammation and fluid in the middle ear. Your middle ear is a part of the ear that contains bones for hearing as well as air that helps send sounds to your brain. What are the causes? This condition is caused by a blockage in the eustachian tube. This tube drains fluid from the ear to the back of the nose (nasopharynx). A blockage in this tube can be caused by an object or by swelling (edema) in the tube. Problems that can cause a blockage include:  A cold or other upper respiratory infection.  Allergies.  An irritant, such as tobacco smoke.  Enlarged adenoids. The adenoids are areas of soft tissue located high in the back of the throat, behind the nose and the roof of the mouth.  A mass in the nasopharynx.  Damage to the ear caused by pressure changes (barotrauma). What are the signs or symptoms? Symptoms of this condition include:  Ear pain.  A fever.  Decreased hearing.  A headache.  Tiredness (lethargy).  Fluid leaking from the ear.  Ringing in the ear. How is this diagnosed? This condition is diagnosed with a physical exam. During the exam your health care provider will use an instrument called an otoscope to look into your ear and check for redness, swelling, and fluid. He or she will also ask about your symptoms. Your health care provider may also order tests, such as:  A test to check the movement of the eardrum (pneumatic otoscopy). This test is done by squeezing a small amount of air into the ear.  A test that changes air pressure in the middle ear to check how well the eardrum moves and whether the eustachian tube is working (tympanogram). How is this treated? This condition usually goes away on its own within 3-5 days. But if the condition is caused by a bacteria infection and does not go away own its own, or keeps coming back, your health care provider may:  Prescribe antibiotic medicines to treat the  infection.  Prescribe or recommend medicines to control pain. Follow these instructions at home:  Take over-the-counter and prescription medicines only as told by your health care provider.  If you were prescribed an antibiotic medicine, take it as told by your health care provider. Do not stop taking the antibiotic even if you start to feel better.  Keep all follow-up visits as told by your health care provider. This is important. Contact a health care provider if:  You have bleeding from your nose.  There is a lump on your neck.  You are not getting better in 5 days.  You feel worse instead of better. Get help right away if:  You have severe pain that is not controlled with medicine.  You have swelling, redness, or pain around your ear.  You have stiffness in your neck.  A part of your face is paralyzed.  The bone behind your ear (mastoid) is tender when you touch it.  You develop a severe headache. Summary  Otitis media is redness, soreness, and swelling of the middle ear.  This condition usually goes away on its own within 3-5 days.  If the problem does not go away in 3-5 days, your health care provider may prescribe or recommend medicines to treat your symptoms.  If you were prescribed an antibiotic medicine, take it as told by your health care provider. This information is not intended to replace advice given to you by your   to you by your health care provider. Make sure you discuss any questions you have with your health care provider. Document Released: 05/09/2004 Document Revised: 07/25/2016 Document Reviewed: 07/25/2016 Elsevier Interactive Patient Education  2018 [place]. Sinus Rinse What is a sinus rinse? A sinus rinse is a simple home treatment that is used to rinse your sinuses with a sterile mixture of salt and water (saline solution). Sinuses are air-filled spaces in your skull behind the bones of your face and forehead that open into your nasal cavity. You will  use the following:  Saline solution.  Neti pot or spray bottle. This releases the saline solution into your nose and through your sinuses. Neti pots and spray bottles can be purchased at [employer], a health food store, or online.  When would I do a sinus rinse? A sinus rinse can help to clear mucus, dirt, dust, or pollen from the nasal cavity. You may do a sinus rinse when you have a cold, a virus, nasal allergy symptoms, a sinus infection, or stuffiness in the nose or sinuses. If you are considering a sinus rinse:  Ask your child's health care provider before performing a sinus rinse on your child.  Do not do a sinus rinse if you have had ear or nasal surgery, ear infection, or blocked ears.  How do I do a sinus rinse?  Wash your hands.  Disinfect your device according to the directions provided and then dry it.  Use the solution that comes with your device or one that is sold separately in stores. Follow the mixing directions on the package.  Fill your device with the amount of saline solution as directed by the device instructions.  Stand over a sink and tilt your head sideways over the sink.  Place the spout of the device in your upper nostril (the one closer to the ceiling).  Gently pour or squeeze the saline solution into the nasal cavity. The liquid should drain to the lower nostril if you are not overly congested.  Gently blow your nose. Blowing too hard may cause ear pain.  Repeat in the other nostril.  Clean and rinse your device with clean water and then air-dry it. Are there risks of a sinus rinse? Sinus rinse is generally very safe and effective. However, there are a few risks, which include:  A burning sensation in the sinuses. This may happen if you do not make the saline solution as directed. Make sure to follow all directions when making the saline solution.  Infection from contaminated water. This is rare, but possible.  Nasal irritation.  This  information is not intended to replace advice given to you by your health care provider. Make sure you discuss any questions you have with your health care provider. Document Released: 03/01/2014 Document Revised: 07/01/2016 Document Reviewed: 12/20/2013 Elsevier Interactive Patient Education  2017 [employer] Chronic Bronchitis Chronic bronchitis is a lasting inflammation of the bronchial tubes, which are the tubes that carry air into your lungs. This is inflammation that occurs:  On most days of the week.  For at least three months at a time.  Over a period of two years in a row.  When the bronchial tubes are inflamed, they start to produce mucus. The inflammation and buildup of mucus make it more difficult to breathe. Chronic bronchitis is usually a permanent problem and is one type of chronic obstructive pulmonary disease (COPD). People with chronic bronchitis are at greater risk for getting repeated colds,  or respiratory infections. What are the causes? Chronic bronchitis most often occurs in people who have:  Long-standing, severe asthma.  A history of smoking.  Asthma and who also smoke.  What are the signs or symptoms? Chronic bronchitis may cause the following:  A cough that brings up mucus (productive cough).  Shortness of breath.  Early morning headache.  Wheezing.  Chest discomfort.  Recurring respiratory infections.  How is this diagnosed? Your health care provider may confirm the diagnosis by:  Taking your medical history.  Performing a physical exam.  Taking a chest X-ray.  Performing pulmonary function tests.  How is this treated? Treatment involves controlling symptoms with medicines, oxygen therapy, or making lifestyle changes, such as exercising and eating a healthy, well-balanced diet. Medicines could include:  Inhalers to improve air flow in and out of your lungs.  Antibiotics to treat bacterial infections, such as pneumonia, sinus  infections, and acute bronchitis.  As a preventative measure, your health care provider may recommend routine vaccinations for influenza and pneumonia. This is to prevent infection and hospitalization since you may be more at risk for these types of infections. Follow these instructions at home:  Take medicines only as directed by your health care provider.  If you smoke cigarettes, chew tobacco, or use electronic cigarettes, quit. If you need help quitting, ask your health care provider.  Avoid pollen, dust, animal dander, molds, smoke, and other things that cause shortness of breath or wheezing attacks.  Talk to your health care provider about possible exercise routines. Regular exercise is very important to help you feel better.  If you are prescribed oxygen use at home follow these guidelines: ? Never smoke while using oxygen. Oxygen does not burn or explode, but flammable materials will burn faster in the presence of oxygen. ? Keep a Data processing manager close by. Let your fire department know that you have oxygen in your home. ? Warn visitors not to smoke near you when you are using oxygen. Put up "no smoking" signs in your home where you most often use the oxygen. ? Regularly test your smoke detectors at home to make sure they work. If you receive care in your home from a nurse or other health care provider, he or she may also check to make sure your smoke detectors work.  Ask your health care provider whether you would benefit from a pulmonary rehabilitation program.  Do not wait to get medical care if you have any concerning symptoms. Delays could cause permanent injury and may be life threatening. Contact a health care provider if:  You have increased coughing or shortness of breath or both.  You have muscle aches.  You have chest pain.  Your mucus gets thicker.  Your mucus changes from clear or white to yellow, green, gray, or bloody. Get help right away if:  Your usual  medicines do not stop your wheezing.  You have increased difficulty breathing.  You have any problems with the medicine you are taking, such as a rash, itching, swelling, or trouble breathing. This information is not intended to replace advice given to you by your health care provider. Make sure you discuss any questions you have with your health care provider. Document Released: 05/22/2006 Document Revised: 12/13/2015 Document Reviewed: 09/12/2013 Elsevier Interactive Patient Education  2018 [employer]. Sinusitis, Adult Sinusitis is soreness and inflammation of your sinuses. Sinuses are hollow spaces in the bones around your face. Your sinuses are located:  Around your eyes.  In the  middle of your forehead.  Behind your nose.  In your cheekbones.  Your sinuses and nasal passages are lined with a stringy fluid (mucus). Mucus normally drains out of your sinuses. When your nasal tissues become inflamed or swollen, the mucus can become trapped or blocked so air cannot flow through your sinuses. This allows bacteria, viruses, and funguses to grow, which leads to infection. Sinusitis can develop quickly and last for 7?10 days (acute) or for more than 12 weeks (chronic). Sinusitis often develops after a cold. What are the causes? This condition is caused by anything that creates swelling in the sinuses or stops mucus from draining, including:  Allergies.  Asthma.  Bacterial or viral infection.  Abnormally shaped bones between the nasal passages.  Nasal growths that contain mucus (nasal polyps).  Narrow sinus openings.  Pollutants, such as chemicals or irritants in the air.  A foreign object stuck in the nose.  A fungal infection. This is rare.  What increases the risk? The following factors may make you more likely to develop this condition:  Having allergies or asthma.  Having had a recent cold or respiratory tract infection.  Having structural deformities or blockages  in your nose or sinuses.  Having a weak immune system.  Doing a lot of swimming or diving.  Overusing nasal sprays.  Smoking.  What are the signs or symptoms? The main symptoms of this condition are pain and a feeling of pressure around the affected sinuses. Other symptoms include:  Upper toothache.  Earache.  Headache.  Bad breath.  Decreased sense of smell and taste.  A cough that may get worse at night.  Fatigue.  Fever.  Thick drainage from your nose. The drainage is often green and it may contain pus (purulent).  Stuffy nose or congestion.  Postnasal drip. This is when extra mucus collects in the throat or back of the nose.  Swelling and warmth over the affected sinuses.  Sore throat.  Sensitivity to light.  How is this diagnosed? This condition is diagnosed based on symptoms, a medical history, and a physical exam. To find out if your condition is acute or chronic, your health care provider may:  Look in your nose for signs of nasal polyps.  Tap over the affected sinus to check for signs of infection.  View the inside of your sinuses using an imaging device that has a light attached (endoscope).  If your health care provider suspects that you have chronic sinusitis, you may also:  Be tested for allergies.  Have a sample of mucus taken from your nose (nasal culture) and checked for bacteria.  Have a mucus sample examined to see if your sinusitis is related to an allergy.  If your sinusitis does not respond to treatment and it lasts longer than 8 weeks, you may have an MRI or CT scan to check your sinuses. These scans also help to determine how severe your infection is. In rare cases, a bone biopsy may be done to rule out more serious types of fungal sinus disease. How is this treated? Treatment for sinusitis depends on the cause and whether your condition is chronic or acute. If a virus is causing your sinusitis, your symptoms will go away on their  own within 10 days. You may be given medicines to relieve your symptoms, including:  Topical nasal decongestants. They shrink swollen nasal passages and let mucus drain from your sinuses.  Antihistamines. These drugs block inflammation that is triggered by allergies. This can  help to ease swelling in your nose and sinuses.  Topical nasal corticosteroids. These are nasal sprays that ease inflammation and swelling in your nose and sinuses.  Nasal saline washes. These rinses can help to get rid of thick mucus in your nose.  If your condition is caused by bacteria, you will be given an antibiotic medicine. If your condition is caused by a fungus, you will be given an antifungal medicine. Surgery may be needed to correct underlying conditions, such as narrow nasal passages. Surgery may also be needed to remove polyps. Follow these instructions at home: Medicines  Take, use, or apply over-the-counter and prescription medicines only as told by your health care provider. These may include nasal sprays.  If you were prescribed an antibiotic medicine, take it as told by your health care provider. Do not stop taking the antibiotic even if you start to feel better. Hydrate and Humidify  Drink enough water to keep your urine clear or pale yellow. Staying hydrated will help to thin your mucus.  Use a cool mist humidifier to keep the humidity level in your home above 50%.  Inhale steam for 10-15 minutes, 3-4 times a day or as told by your health care provider. You can do this in the bathroom while a hot shower is running.  Limit your exposure to cool or dry air. Rest  Rest as much as possible.  Sleep with your head raised (elevated).  Make sure to get enough sleep each night. General instructions  Apply a warm, moist washcloth to your face 3-4 times a day or as told by your health care provider. This will help with discomfort.  Wash your hands often with soap and water to reduce your exposure  to viruses and other germs. If soap and water are not available, use hand sanitizer.  Do not smoke. Avoid being around people who are smoking (secondhand smoke).  Keep all follow-up visits as told by your health care provider. This is important. Contact a health care provider if:  You have a fever.  Your symptoms get worse.  Your symptoms do not improve within 10 days. Get help right away if:  You have a severe headache.  You have persistent vomiting.  You have pain or swelling around your face or eyes.  You have vision problems.  You develop confusion.  Your neck is stiff.  You have trouble breathing. This information is not intended to replace advice given to you by your health care provider. Make sure you discuss any questions you have with your health care provider. Document Released: 08/04/2005 Document Revised: 03/30/2016 Document Reviewed: 05/30/2015 Elsevier Interactive Patient Education  [employer].

## 2017-12-01 ENCOUNTER — Ambulatory Visit: Payer: Self-pay | Admitting: Registered Nurse

## 2017-12-01 VITALS — BP 127/78 | HR 73 | Temp 97.9°F

## 2017-12-01 DIAGNOSIS — J209 Acute bronchitis, unspecified: Secondary | ICD-10-CM

## 2017-12-01 DIAGNOSIS — J0101 Acute recurrent maxillary sinusitis: Secondary | ICD-10-CM

## 2017-12-01 DIAGNOSIS — H65192 Other acute nonsuppurative otitis media, left ear: Secondary | ICD-10-CM

## 2017-12-01 MED ORDER — AZELASTINE HCL 137 MCG/SPRAY NA SOLN: 1.0000 | Freq: Two times a day (BID) | NASAL | 1 refills | Status: DC

## 2017-12-01 MED ORDER — ALBUTEROL SULFATE HFA 108 (90 BASE) MCG/ACT IN AERS: 1.0000 | INHALATION_SPRAY | RESPIRATORY_TRACT | 0 refills | Status: DC | PRN

## 2017-12-01 MED ORDER — AMOXICILLIN-POT CLAVULANATE 875-125 MG PO TABS: 1.0000 | ORAL_TABLET | Freq: Two times a day (BID) | ORAL | 0 refills | Status: AC

## 2017-12-01 MED ORDER — PREDNISONE 10 MG (21) PO TBPK: ORAL_TABLET | ORAL | 0 refills | Status: DC

## 2017-12-01 MED ORDER — SALINE SPRAY 0.65 % NA SOLN: 2.0000 | NASAL | 0 refills | Status: DC

## 2017-12-01 NOTE — Patient Instructions (Addendum)
Allergic Rhinitis, Adult Allergic rhinitis is an allergic reaction that affects the mucous membrane inside the nose. It causes sneezing, a runny or stuffy nose, and the feeling of mucus going down the back of the throat (postnasal drip). Allergic rhinitis can be mild to severe. There are two types of allergic rhinitis:  Seasonal. This type is also called hay fever. It happens only during certain seasons.  Perennial. This type can happen at any time of the year.  What are the causes? This condition happens when the body's defense system (immune system) responds to certain harmless substances called allergens as though they were germs.  Seasonal allergic rhinitis is triggered by pollen, which can come from grasses, trees, and weeds. Perennial allergic rhinitis may be caused by:  House dust mites.  Pet dander.  Mold spores.  What are the signs or symptoms? Symptoms of this condition include:  Sneezing.  Runny or stuffy nose (nasal congestion).  Postnasal drip.  Itchy nose.  Tearing of the eyes.  Trouble sleeping.  Daytime sleepiness.  How is this diagnosed? This condition may be diagnosed based on:  Your medical history.  A physical exam.  Tests to check for related conditions, such as: ? Asthma. ? Pink eye. ? Ear infection. ? Upper respiratory infection.  Tests to find out which allergens trigger your symptoms. These may include skin or blood tests.  How is this treated? There is no cure for this condition, but treatment can help control symptoms. Treatment may include:  Taking medicines that block allergy symptoms, such as antihistamines. Medicine may be given as a shot, nasal spray, or pill.  Avoiding the allergen.  Desensitization. This treatment involves getting ongoing shots until your body becomes less sensitive to the allergen. This treatment may be done if other treatments do not help.  If taking medicine and avoiding the allergen does not work, new,  stronger medicines may be prescribed.  Follow these instructions at home:  Find out what you are allergic to. Common allergens include smoke, dust, and pollen.  Avoid the things you are allergic to. These are some things you can do to help avoid allergens: ? Replace carpet with wood, tile, or vinyl flooring. Carpet can trap dander and dust. ? Do not smoke. Do not allow smoking in your home. ? Change your heating and air conditioning filter at least once a month. ? During allergy season:  Keep windows closed as much as possible.  Plan outdoor activities when pollen counts are lowest. This is usually during the evening hours.  When coming indoors, change clothing and shower before sitting on furniture or bedding.  Take over-the-counter and prescription medicines only as told by your health care provider.  Keep all follow-up visits as told by your health care provider. This is important. Contact a health care provider if:  You have a fever.  You develop a persistent cough.  You make whistling sounds when you breathe (you wheeze).  Your symptoms interfere with your normal daily activities. Get help right away if:  You have shortness of breath. Summary  This condition can be managed by taking medicines as directed and avoiding allergens.  Contact your health care provider if you develop a persistent cough or fever.  During allergy season, keep windows closed as much as possible. This information is not intended to replace advice given to you by your health care provider. Make sure you discuss any questions you have with your health care provider. Document Released: 04/29/2001 Document Revised: 09/11/2016  Document Reviewed: 09/11/2016 Elsevier Interactive Patient Education  2018 [employer]. Sinusitis, Adult Sinusitis is soreness and inflammation of your sinuses. Sinuses are hollow spaces in the bones around your face. Your sinuses are located:  Around your eyes.  In the  middle of your forehead.  Behind your nose.  In your cheekbones.  Your sinuses and nasal passages are lined with a stringy fluid (mucus). Mucus normally drains out of your sinuses. When your nasal tissues become inflamed or swollen, the mucus can become trapped or blocked so air cannot flow through your sinuses. This allows bacteria, viruses, and funguses to grow, which leads to infection. Sinusitis can develop quickly and last for 7?10 days (acute) or for more than 12 weeks (chronic). Sinusitis often develops after a cold. What are the causes? This condition is caused by anything that creates swelling in the sinuses or stops mucus from draining, including:  Allergies.  Asthma.  Bacterial or viral infection.  Abnormally shaped bones between the nasal passages.  Nasal growths that contain mucus (nasal polyps).  Narrow sinus openings.  Pollutants, such as chemicals or irritants in the air.  A foreign object stuck in the nose.  A fungal infection. This is rare.  What increases the risk? The following factors may make you more likely to develop this condition:  Having allergies or asthma.  Having had a recent cold or respiratory tract infection.  Having structural deformities or blockages in your nose or sinuses.  Having a weak immune system.  Doing a lot of swimming or diving.  Overusing nasal sprays.  Smoking.  What are the signs or symptoms? The main symptoms of this condition are pain and a feeling of pressure around the affected sinuses. Other symptoms include:  Upper toothache.  Earache.  Headache.  Bad breath.  Decreased sense of smell and taste.  A cough that may get worse at night.  Fatigue.  Fever.  Thick drainage from your nose. The drainage is often green and it may contain pus (purulent).  Stuffy nose or congestion.  Postnasal drip. This is when extra mucus collects in the throat or back of the nose.  Swelling and warmth over the  affected sinuses.  Sore throat.  Sensitivity to light.  How is this diagnosed? This condition is diagnosed based on symptoms, a medical history, and a physical exam. To find out if your condition is acute or chronic, your health care provider may:  Look in your nose for signs of nasal polyps.  Tap over the affected sinus to check for signs of infection.  View the inside of your sinuses using an imaging device that has a light attached (endoscope).  If your health care provider suspects that you have chronic sinusitis, you may also:  Be tested for allergies.  Have a sample of mucus taken from your nose (nasal culture) and checked for bacteria.  Have a mucus sample examined to see if your sinusitis is related to an allergy.  If your sinusitis does not respond to treatment and it lasts longer than 8 weeks, you may have an MRI or CT scan to check your sinuses. These scans also help to determine how severe your infection is. In rare cases, a bone biopsy may be done to rule out more serious types of fungal sinus disease. How is this treated? Treatment for sinusitis depends on the cause and whether your condition is chronic or acute. If a virus is causing your sinusitis, your symptoms will go away on their own  within 10 days. You may be given medicines to relieve your symptoms, including:  Topical nasal decongestants. They shrink swollen nasal passages and let mucus drain from your sinuses.  Antihistamines. These drugs block inflammation that is triggered by allergies. This can help to ease swelling in your nose and sinuses.  Topical nasal corticosteroids. These are nasal sprays that ease inflammation and swelling in your nose and sinuses.  Nasal saline washes. These rinses can help to get rid of thick mucus in your nose.  If your condition is caused by bacteria, you will be given an antibiotic medicine. If your condition is caused by a fungus, you will be given an antifungal  medicine. Surgery may be needed to correct underlying conditions, such as narrow nasal passages. Surgery may also be needed to remove polyps. Follow these instructions at home: Medicines  Take, use, or apply over-the-counter and prescription medicines only as told by your health care provider. These may include nasal sprays.  If you were prescribed an antibiotic medicine, take it as told by your health care provider. Do not stop taking the antibiotic even if you start to feel better. Hydrate and Humidify  Drink enough water to keep your urine clear or pale yellow. Staying hydrated will help to thin your mucus.  Use a cool mist humidifier to keep the humidity level in your home above 50%.  Inhale steam for 10-15 minutes, 3-4 times a day or as told by your health care provider. You can do this in the bathroom while a hot shower is running.  Limit your exposure to cool or dry air. Rest  Rest as much as possible.  Sleep with your head raised (elevated).  Make sure to get enough sleep each night. General instructions  Apply a warm, moist washcloth to your face 3-4 times a day or as told by your health care provider. This will help with discomfort.  Wash your hands often with soap and water to reduce your exposure to viruses and other germs. If soap and water are not available, use hand sanitizer.  Do not smoke. Avoid being around people who are smoking (secondhand smoke).  Keep all follow-up visits as told by your health care provider. This is important. Contact a health care provider if:  You have a fever.  Your symptoms get worse.  Your symptoms do not improve within 10 days. Get help right away if:  You have a severe headache.  You have persistent vomiting.  You have pain or swelling around your face or eyes.  You have vision problems.  You develop confusion.  Your neck is stiff.  You have trouble breathing. This information is not intended to replace advice given  to you by your health care provider. Make sure you discuss any questions you have with your health care provider. Document Released: 08/04/2005 Document Revised: 03/30/2016 Document Reviewed: 05/30/2015 Elsevier Interactive Patient Education  2018 [employer] Sinus Rinse What is a sinus rinse? A sinus rinse is a simple home treatment that is used to rinse your sinuses with a sterile mixture of salt and water (saline solution). Sinuses are air-filled spaces in your skull behind the bones of your face and forehead that open into your nasal cavity. You will use the following:  Saline solution.  Neti pot or spray bottle. This releases the saline solution into your nose and through your sinuses. Neti pots and spray bottles can be purchased at your local pharmacy, a health food store, or online.  When would   I do a sinus rinse? A sinus rinse can help to clear mucus, dirt, dust, or pollen from the nasal cavity. You may do a sinus rinse when you have a cold, a virus, nasal allergy symptoms, a sinus infection, or stuffiness in the nose or sinuses. If you are considering a sinus rinse:  Ask your child's health care provider before performing a sinus rinse on your child.  Do not do a sinus rinse if you have had ear or nasal surgery, ear infection, or blocked ears.  How do I do a sinus rinse?  Wash your hands.  Disinfect your device according to the directions provided and then dry it.  Use the solution that comes with your device or one that is sold separately in stores. Follow the mixing directions on the package.  Fill your device with the amount of saline solution as directed by the device instructions.  Stand over a sink and tilt your head sideways over the sink.  Place the spout of the device in your upper nostril (the one closer to the ceiling).  Gently pour or squeeze the saline solution into the nasal cavity. The liquid should drain to the lower nostril if you are not overly  congested.  Gently blow your nose. Blowing too hard may cause ear pain.  Repeat in the other nostril.  Clean and rinse your device with clean water and then air-dry it. Are there risks of a sinus rinse? Sinus rinse is generally very safe and effective. However, there are a few risks, which include:  A burning sensation in the sinuses. This may happen if you do not make the saline solution as directed. Make sure to follow all directions when making the saline solution.  Infection from contaminated water. This is rare, but possible.  Nasal irritation.  This information is not intended to replace advice given to you by your health care provider. Make sure you discuss any questions you have with your health care provider. Document Released: 03/01/2014 Document Revised: 07/01/2016 Document Reviewed: 12/20/2013 Elsevier Interactive Patient Education  2017 [place]. How to Use a Metered Dose Inhaler A metered dose inhaler is a handheld device for taking medicine that must be breathed into the lungs (inhaled). The device can be used to deliver a variety of inhaled medicines, including:  Quick relief or rescue medicines, such as bronchodilators.  Controller medicines, such as corticosteroids.  The medicine is delivered by pushing down on a metal canister to release a preset amount of spray and medicine. Each device contains the amount of medicine that is needed for a preset number of uses (inhalations). Your health care provider may recommend that you use a spacer with your inhaler to help you take the medicine more effectively. A spacer is a plastic tube with a mouthpiece on one end and an opening that connects to the inhaler on the other end. A spacer holds the medicine in a tube for a short time, which allows you to inhale more medicine. What are the risks? If you do not use your inhaler correctly, medicine might not reach your lungs to help you breathe. Inhaler medicine can cause side  effects, such as:  Mouth or throat infection.  Cough.  Hoarseness.  Headache.  Nausea and vomiting.  Lung infection (pneumonia) in people who have a lung condition called COPD.  How to use a metered dose inhaler without a spacer 1. Remove the cap from the inhaler. 2. If you are using the inhaler for the first  time, shake it for 5 seconds, turn it away from your face, then release 4 puffs into the air. This is called priming. 3. Shake the inhaler for 5 seconds. 4. Position the inhaler so the top of the canister faces up. 5. Put your index finger on the top of the medicine canister. Support the bottom of the inhaler with your thumb. 6. Breathe out normally and as completely as possible, away from the inhaler. 7. Either place the inhaler between your teeth and close your lips tightly around the mouthpiece, or hold the inhaler 1-2 inches (2.5-5 cm) away from your open mouth. Keep your tongue down out of the way. If you are unsure which technique to use, ask your health care provider. 8. Press the canister down with your index finger to release the medicine, then inhale deeply and slowly through your mouth (not your nose) until your lungs are completely filled. Inhaling should take 4-6 seconds. 9. Hold the medicine in your lungs for 5-10 seconds (10 seconds is best). This helps the medicine get into the small airways of your lungs. 10. With your lips in a tight circle (pursed), breathe out slowly. 11. Repeat steps 3-10 until you have taken the number of puffs that your health care provider directed. Wait about 1 minute between puffs or as directed. 12. Put the cap on the inhaler. 13. If you are using a steroid inhaler, rinse your mouth with water, gargle, and spit out the water. Do not swallow the water. How to use a metered dose inhaler with a spacer 1. Remove the cap from the inhaler. 2. If you are using the inhaler for the first time, shake it for 5 seconds, turn it away from your face,  then release 4 puffs into the air. This is called priming. 3. Shake the inhaler for 5 seconds. 4. Place the open end of the spacer onto the inhaler mouthpiece. 5. Position the inhaler so the top of the canister faces up and the spacer mouthpiece faces you. 6. Put your index finger on the top of the medicine canister. Support the bottom of the inhaler and the spacer with your thumb. 7. Breathe out normally and as completely as possible, away from the spacer. 8. Place the spacer between your teeth and close your lips tightly around it. Keep your tongue down out of the way. 9. Press the canister down with your index finger to release the medicine, then inhale deeply and slowly through your mouth (not your nose) until your lungs are completely filled. Inhaling should take 4-6 seconds. 10. Hold the medicine in your lungs for 5-10 seconds (10 seconds is best). This helps the medicine get into the small airways of your lungs. 11. With your lips in a tight circle (pursed), breathe out slowly. 12. Repeat steps 3-11 until you have taken the number of puffs that your health care provider directed. Wait about 1 minute between puffs or as directed. 13. Remove the spacer from the inhaler and put the cap on the inhaler. 14. If you are using a steroid inhaler, rinse your mouth with water, gargle, and spit out the water. Do not swallow the water. Follow these instructions at home:  Take your inhaled medicine only as told by your health care provider. Do not use the inhaler more than directed by your health care provider.  Keep all follow-up visits as told by your health care provider. This is important.  If your inhaler has a counter, you can check it to  determine how full your inhaler is. If your inhaler does not have a counter, ask your health care provider when you will need to refill your inhaler and write the refill date on a calendar or on your inhaler canister. Note that you cannot know when an inhaler is  empty by shaking it.  Follow directions on the package insert for care and cleaning of your inhaler and spacer. Contact a health care provider if:  Symptoms are only partially relieved with your inhaler.  You are having trouble using your inhaler.  You have an increase in phlegm.  You have headaches. Get help right away if:  You feel little or no relief after using your inhaler.  You have dizziness.  You have a fast heart rate.  You have chills or a fever.  You have night sweats.  There is blood in your phlegm. Summary  A metered dose inhaler is a handheld device for taking medicine that must be breathed into the lungs (inhaled).  The medicine is delivered by pushing down on a metal canister to release a preset amount of spray and medicine.  Each device contains the amount of medicine that is needed for a preset number of uses (inhalations). This information is not intended to replace advice given to you by your health care provider. Make sure you discuss any questions you have with your health care provider. Document Released: 08/04/2005 Document Revised: 06/24/2016 Document Reviewed: 06/24/2016 Elsevier Interactive Patient Education  2017 [employer] Acute Bronchitis, Adult Acute bronchitis is sudden (acute) swelling of the air tubes (bronchi) in the lungs. Acute bronchitis causes these tubes to fill with mucus, which can make it hard to breathe. It can also cause coughing or wheezing. In adults, acute bronchitis usually goes away within 2 weeks. A cough caused by bronchitis may last up to 3 weeks. Smoking, allergies, and asthma can make the condition worse. Repeated episodes of bronchitis may cause further lung problems, such as chronic obstructive pulmonary disease (COPD). What are the causes? This condition can be caused by germs and by substances that irritate the lungs, including:  Cold and flu viruses. This condition is most often caused by the same virus that  causes a cold.  Bacteria.  Exposure to tobacco smoke, dust, fumes, and air pollution.  What increases the risk? This condition is more likely to develop in people who:  Have close contact with someone with acute bronchitis.  Are exposed to lung irritants, such as tobacco smoke, dust, fumes, and vapors.  Have a weak immune system.  Have a respiratory condition such as asthma.  What are the signs or symptoms? Symptoms of this condition include:  A cough.  Coughing up clear, yellow, or green mucus.  Wheezing.  Chest congestion.  Shortness of breath.  A fever.  Body aches.  Chills.  A sore throat.  How is this diagnosed? This condition is usually diagnosed with a physical exam. During the exam, your health care provider may order tests, such as chest X-rays, to rule out other conditions. He or she may also:  Test a sample of your mucus for bacterial infection.  Check the level of oxygen in your blood. This is done to check for pneumonia.  Do a chest X-ray or lung function testing to rule out pneumonia and other conditions.  Perform blood tests.  Your health care provider will also ask about your symptoms and medical history. How is this treated? Most cases of acute bronchitis clear up over time  without treatment. Your health care provider may recommend:  Drinking more fluids. Drinking more makes your mucus thinner, which may make it easier to breathe.  Taking a medicine for a fever or cough.  Taking an antibiotic medicine.  Using an inhaler to help improve shortness of breath and to control a cough.  Using a cool mist vaporizer or humidifier to make it easier to breathe.  Follow these instructions at home: Medicines  Take over-the-counter and prescription medicines only as told by your health care provider.  If you were prescribed an antibiotic, take it as told by your health care provider. Do not stop taking the antibiotic even if you start to feel  better. General instructions  Get plenty of rest.  Drink enough fluids to keep your urine clear or pale yellow.  Avoid smoking and secondhand smoke. Exposure to cigarette smoke or irritating chemicals will make bronchitis worse. If you smoke and you need help quitting, ask your health care provider. Quitting smoking will help your lungs heal faster.  Use an inhaler, cool mist vaporizer, or humidifier as told by your health care provider.  Keep all follow-up visits as told by your health care provider. This is important. How is this prevented? To lower your risk of getting this condition again:  Wash your hands often with soap and water. If soap and water are not available, use hand sanitizer.  Avoid contact with people who have cold symptoms.  Try not to touch your hands to your mouth, nose, or eyes.  Make sure to get the flu shot every year.  Contact a health care provider if:  Your symptoms do not improve in 2 weeks of treatment. Get help right away if:  You cough up blood.  You have chest pain.  You have severe shortness of breath.  You become dehydrated.  You faint or keep feeling like you are going to faint.  You keep vomiting.  You have a severe headache.  Your fever or chills gets worse. This information is not intended to replace advice given to you by your health care provider. Make sure you discuss any questions you have with your health care provider. Document Released: 09/11/2004 Document Revised: 02/27/2016 Document Reviewed: 01/23/2016 Elsevier Interactive Patient Education  2018 [employer].  Otitis Media, Adult Otitis media occurs when there is inflammation and fluid in the middle ear. Your middle ear is a part of the ear that contains bones for hearing as well as air that helps send sounds to your brain. What are the causes? This condition is caused by a blockage in the eustachian tube. This tube drains fluid from the ear to the back of the nose  (nasopharynx). A blockage in this tube can be caused by an object or by swelling (edema) in the tube. Problems that can cause a blockage include:  A cold or other upper respiratory infection.  Allergies.  An irritant, such as tobacco smoke.  Enlarged adenoids. The adenoids are areas of soft tissue located high in the back of the throat, behind the nose and the roof of the mouth.  A mass in the nasopharynx.  Damage to the ear caused by pressure changes (barotrauma).  What are the signs or symptoms? Symptoms of this condition include:  Ear pain.  A fever.  Decreased hearing.  A headache.  Tiredness (lethargy).  Fluid leaking from the ear.  Ringing in the ear.  How is this diagnosed? This condition is diagnosed with a physical exam. During the  exam your health care provider will use an instrument called an otoscope to look into your ear and check for redness, swelling, and fluid. He or she will also ask about your symptoms. Your health care provider may also order tests, such as:  A test to check the movement of the eardrum (pneumatic otoscopy). This test is done by squeezing a small amount of air into the ear.  A test that changes air pressure in the middle ear to check how well the eardrum moves and whether the eustachian tube is working (tympanogram).  How is this treated? This condition usually goes away on its own within 3-5 days. But if the condition is caused by a bacteria infection and does not go away own its own, or keeps coming back, your health care provider may:  Prescribe antibiotic medicines to treat the infection.  Prescribe or recommend medicines to control pain.  Follow these instructions at home:  Take over-the-counter and prescription medicines only as told by your health care provider.  If you were prescribed an antibiotic medicine, take it as told by your health care provider. Do not stop taking the antibiotic even if you start to feel  better.  Keep all follow-up visits as told by your health care provider. This is important. Contact a health care provider if:  You have bleeding from your nose.  There is a lump on your neck.  You are not getting better in 5 days.  You feel worse instead of better. Get help right away if:  You have severe pain that is not controlled with medicine.  You have swelling, redness, or pain around your ear.  You have stiffness in your neck.  A part of your face is paralyzed.  The bone behind your ear (mastoid) is tender when you touch it.  You develop a severe headache. Summary  Otitis media is redness, soreness, and swelling of the middle ear.  This condition usually goes away on its own within 3-5 days.  If the problem does not go away in 3-5 days, your health care provider may prescribe or recommend medicines to treat your symptoms.  If you were prescribed an antibiotic medicine, take it as told by your health care provider. This information is not intended to replace advice given to you by your health care provider. Make sure you discuss any questions you have with your health care provider. Document Released: 05/09/2004 Document Revised: 07/25/2016 Document Reviewed: 07/25/2016 Elsevier Interactive Patient Education  [employer].

## 2017-12-01 NOTE — Progress Notes (Signed)
Subjective:    Patient ID: [name], female    DOB: 1961-05-30, 57 y.o.   MRN: [id number]  57y/o established caucasian female patient c/o maxillary and frontal sinus pain and pressure, upper teeth pain, pressure behind bil eyes, frontal HA, nasal congestion, PND causing sore throat. Bil ears popping, but no pain or drainage. Used sweet oil on cotton ball on R ear this weekend. Sx improved while on augmentin 1 week but then  worsened from visit one month ago.  Had improved but never resolved completely, but worsened 1.5 weeks ago. Chronic cough typically nonproductive still present. Using Mucinex, sinus rinses saline, Singulair, Flonase, Symbicort, tessalon pearles, Phenylephrine. All meds with short term relief at home. Would like refill on albuterol inhaler and nasal saline.  Using cough lozenges with honey OTC.  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 + sick contacts at work still as well.       Review of Systems  Constitutional: Negative for activity change, appetite change, chills, diaphoresis, fatigue, fever and unexpected weight change.  HENT: Positive for congestion, ear pain, postnasal drip, rhinorrhea, sinus pressure, sinus pain and sore throat. Negative for dental problem, drooling, ear discharge, facial swelling, hearing loss, mouth sores, nosebleeds, sneezing, tinnitus, trouble swallowing and voice change.   Eyes: Negative for photophobia, pain, discharge, redness, itching and visual disturbance.  Respiratory: Positive for cough and wheezing. Negative for choking, chest tightness, shortness of breath and stridor.   Cardiovascular: Negative for chest pain, palpitations and leg swelling.  Gastrointestinal: Negative for abdominal distention, abdominal pain, blood in stool, constipation, diarrhea, nausea and vomiting.  Endocrine: Negative for cold intolerance and heat intolerance.  Genitourinary: Negative for  difficulty urinating, dysuria and hematuria.  Musculoskeletal: Negative for arthralgias, back pain, gait problem, joint swelling, myalgias, neck pain and neck stiffness.  Skin: Negative for color change, pallor, rash and wound.  Allergic/Immunologic: Positive for environmental allergies. Negative for food allergies.  Neurological: Positive for headaches. Negative for dizziness, tremors, seizures, syncope, facial asymmetry, speech difficulty, weakness, light-headedness and numbness.  Hematological: Negative for adenopathy. Does not bruise/bleed easily.  Psychiatric/Behavioral: Negative for agitation, behavioral problems, confusion and sleep disturbance.       Objective:   Physical Exam  Constitutional: She is oriented to person, place, and time. She appears well-developed and well-nourished. She is active and cooperative.  Non-toxic appearance. She does not have a sickly appearance. She appears ill. No distress.  HENT:  Head: Normocephalic and atraumatic.  Right Ear: Hearing, external ear and ear canal normal. A middle ear effusion is present.  Left Ear: Hearing, external ear and ear canal normal. Tympanic membrane is erythematous and bulging. A middle ear effusion is present.  Nose: Mucosal edema and rhinorrhea present. No nose lacerations, sinus tenderness, nasal deformity, septal deviation or nasal septal hematoma. No epistaxis.  No foreign bodies. Right sinus exhibits maxillary sinus tenderness and frontal sinus tenderness. Left sinus exhibits maxillary sinus tenderness and frontal sinus tenderness.  Mouth/Throat: Uvula is midline and mucous membranes are normal. Mucous membranes are not pale, not dry and not cyanotic. She does not have dentures. No oral lesions. No trismus in the jaw. Normal dentition. No dental abscesses, uvula swelling, lacerations or dental caries. Posterior oropharyngeal edema and posterior oropharyngeal erythema present. No oropharyngeal exudate or tonsillar abscesses.   Bilateral allergic shiners; Left TM red; air fluid level clear bilaterally, cobblestoning posterior pharynx; maxillary greater than fronta sinus TTP  bilaterally  frequent nonproductive harsh cough in exam room and posterior cervical pain with c-spine rotation/flexion/extension/lateral bending; bilateral nasal turbinates edema/erythema clear yellow tinged discharge  Eyes: Pupils are equal, round, and reactive to light. Conjunctivae, EOM and lids are normal. Right eye exhibits no chemosis, no discharge, no exudate and no hordeolum. No foreign body present in the right eye. Left eye exhibits no chemosis, no discharge, no exudate and no hordeolum. No foreign body present in the left eye. Right conjunctiva is not injected. Right conjunctiva has no hemorrhage. Left conjunctiva is not injected. Left conjunctiva has no hemorrhage. No scleral icterus. Right eye exhibits normal extraocular motion and no nystagmus. Left eye exhibits normal extraocular motion and no nystagmus. Right pupil is round and reactive. Left pupil is round and reactive. Pupils are equal.  Neck: Trachea normal, normal range of motion and phonation normal. Neck supple. No tracheal tenderness, no spinous process tenderness and no muscular tenderness present. No neck rigidity. No tracheal deviation, no edema, no erythema and normal range of motion present. No thyroid mass and no thyromegaly present.    Cardiovascular: Normal rate, regular rhythm, S1 normal, S2 normal, normal heart sounds and intact distal pulses. PMI is not displaced. Exam reveals no gallop, no distant heart sounds and no friction rub.  No murmur heard. Pulmonary/Chest: Effort normal and breath sounds normal. No accessory muscle usage or stridor. No respiratory distress. She has no decreased breath sounds. She has no wheezes. She has no rhonchi. She has no rales. She exhibits no tenderness.  Nonproductive cough harsh intermittent observed in exam room; spoke full sentences without  difficulty  Abdominal: Soft. Normal appearance. She exhibits no distension, no fluid wave and no ascites. There is no rigidity and no guarding.  Musculoskeletal: Normal range of motion. She exhibits no edema or tenderness.       Right shoulder: Normal.       Left shoulder: Normal.       Right elbow: Normal.      Left elbow: Normal.       Right hip: Normal.       Left hip: Normal.       Right knee: Normal.       Left knee: Normal.       Cervical back: Normal.       Thoracic back: Normal.       Lumbar back: Normal.       Right hand: Normal.       Left hand: Normal.  In/out of chair and on/off exam table without difficulty; gait sure and steady in hallway  Lymphadenopathy:       Head (right side): No submental, no submandibular, no tonsillar, no preauricular, no posterior auricular and no occipital adenopathy present.       Head (left side): No submental, no submandibular, no tonsillar, no preauricular, no posterior auricular and no occipital adenopathy present.    She has no cervical adenopathy.       Right cervical: No superficial cervical, no deep cervical and no posterior cervical adenopathy present.      Left cervical: No superficial cervical, no deep cervical and no posterior cervical adenopathy present.  Neurological: She is alert and oriented to person, place, and time. She has normal strength. She is not disoriented. She displays no atrophy and no tremor. No cranial nerve deficit or sensory deficit. She exhibits normal muscle tone. She displays no seizure activity. Coordination and gait normal. GCS eye subscore is 4. GCS verbal subscore is 5.  GCS motor subscore is 6.  Bilateral hand grasp equal 5/5; normal heel toe walk  Skin: Skin is warm, dry and intact. No abrasion, no bruising, no burn, no ecchymosis, no laceration, no lesion, no petechiae and no rash noted. She is not diaphoretic. No cyanosis or erythema. No pallor. Nails show no clubbing.  Psychiatric: She has a normal mood and  affect. Her speech is normal and behavior is normal. Judgment and thought content normal. Cognition and memory are normal.  Nursing note and vitals reviewed.         Assessment & Plan:  A-recurrent maxillary sinusitis, seasonal allergic rhinitis, left acute otitis media and acute bronchitis  P-continue flonase 1 spray each nostril BID, saline 2 sprays each nostril q2h wa prn congestion given another bottle from clinic stock to keep at work desk.  Encouraged more frequent use as only Bid-TID currently.  Discussed use when exposed to dust or after going outside due to high pollen counts. di augmentin 875mg  po BID x 10 days #20 dispensed from [facility] Denied personal or family history of ENT cancer.  Shower BID especially prior to bed. No evidence of systemic bacterial infection, non toxic and well hydrated.  I do not see where any further testing or imaging is necessary at this time.   I will suggest supportive care, rest, good hygiene and encourage the patient to take adequate fluids.  The patient is to return to clinic or EMERGENCY ROOM if symptoms worsen or change significantly.  Exitcare handout on sinusitis and sinus rinse given to patient.  Patient verbalized agreement and understanding of treatment plan and had no further questions at this time.    Patient may use normal saline nasal spray 2 sprays each nostril q2h wa as needed.  Currently Only using saline at night discussed increasing use especially if dust exposure, going outside on high pollen count days and upon awakening am in addition to use during her nightly shower.  Given 8 UD phenylephrine 10mg  po q6h prn rhinitis from clinic stock.   flonase 23mcg 1 spray each nostril BID Patient denied personal or family history of ENT cancer.  OTC antihistamine of choice zyrtec 10mg  po daily.   If still having breakthrough PDN/rhinitis Electronic Rx azelastine 182mcg nasal 1 spray each nostril BID #1 RF1. Avoid triggers if possible.  Shower prior to  bedtime if exposed to triggers.  If allergic dust/dust mites recommend mattress/pillow covers/encasements; washing linens, vacuuming, sweeping, dusting weekly.  Call or return to clinic as needed if these symptoms worsen or fail to improve as anticipated.   Exitcare handout on allergic rhinitis and sinus rinse given to patient.  Patient verbalized understanding of instructions, agreed with plan of care and had no further questions at this time.  P2:  Avoidance and hand washing and cover cough.  Restart tessalon pearles at home 200mg  po TID prn cough. Cough lozenges po q2h prn cough given 8 UD from clinic stock.  Prednisone taper 10mg  (60/50/40/30/20/10mg ) po daily with breakfast #21 RF0 dispensed from [facility].  Discussed possible side effects increased/decreased appetite, difficulty sleeping, increased blood sugar, increased blood pressure and heart rate.  Albuterol MDI 40mcg 1-2 puffs po q4-6h prn protracted cough/wheeze #1 RF0 side effect increased heart rate. Discussed use albuterol for 7 days at least am and qhs and prn if protracted coughing/wheeze q4h. Bronchitis simple, community acquired, may have started as viral (probably respiratory syncytial, parainfluenza, influenza, or adenovirus), but now evidence of acute purulent bronchitis with resultant bronchial edema and  mucus formation.  Viruses are the most common cause of bronchial inflammation in otherwise healthy adults with acute bronchitis.  The appearance of sputum is not predictive of whether a bacterial infection is present.  Purulent sputum is most often caused by viral infections.  There are a small portion of those caused by non-viral agents being Mycoplama pneumonia.  Microscopic examination or C&S of sputum in the healthy adult with acute bronchitis is generally not helpful (usually negative or normal respiratory flora) other considerations being cough from upper respiratory tract infections, sinusitis or allergic syndromes (mild asthma or viral  pneumonia).  Differential Diagnoses:  reactive airway disease (asthma, allergic aspergillosis (eosinophilia), chronic bronchitis, respiratory infection (sinusitis, common cold, pneumonia), congestive heart failure, reflux esophagitis, bronchogenic tumor, aspiration syndromes and/or exposure to pulmonary irritants/smoke.  I will orderWithout high fever, severe dyspnea, lack of physical findings or other risk factors, I will hold on a chest radiograph and CBC at this time.  I discussed that approximately 50% of patients with acute bronchitis have a cough that lasts up to three weeks, and 25% for over a month.  Tylenol 500mg  one to two tablets every four to six hours as needed for fever or myalgias.  No aspirin. Exitcare handout on bronchitis and inhaler use given to patient.  ER if hemopthysis, SOB, worst chest pain of life.   Patient instructed to follow up in one week or sooner if symptoms worsen.  Patient verbalized agreement and understanding of treatment plan.  P2:  hand washing and cover cough  Augmentin 875mg  po BID x 10 days #20 RF0 dispensed from [facility] to patient.  Supportive treatment.   No evidence of invasive bacterial infection, non toxic and well hydrated.  This is most likely self limiting viral infection.  I do not see where any further testing or imaging is necessary at this time.   I will suggest supportive care, rest, good hygiene and encourage the patient to take adequate fluids.  The patient is to return to clinic or EMERGENCY ROOM if symptoms worsen or change significantly e.g. ear pain, fever, purulent discharge from ears or bleeding.  Exitcare handout on otitis media given to patient.  Patient verbalized agreement and understanding of treatment plan.        discussed potential hypokalemia with prednisone and albuterol and increased BP/HR/insomnia  Have low sugar powerade/Gatorade/banana/potatoes daily while on prednisone daily  Omeprazole take at bedtime 1 hour after augmentin as decreased  efficacy amoxillin with omeprazole concurrent use.  Patient verbalized understanding information/instructions agreed with plan of care and had no further questions at this time

## 2017-12-25 ENCOUNTER — Other Ambulatory Visit (INDEPENDENT_AMBULATORY_CARE_PROVIDER_SITE_OTHER): Payer: PRIVATE HEALTH INSURANCE

## 2017-12-25 DIAGNOSIS — G629 Polyneuropathy, unspecified: Secondary | ICD-10-CM

## 2017-12-25 LAB — VITAMIN B12: VITAMIN B 12: 608 pg/mL (ref 211–911)

## 2017-12-27 ENCOUNTER — Other Ambulatory Visit: Payer: Self-pay | Admitting: Family Medicine

## 2017-12-27 MED ORDER — VITAMIN B-12 1000 MCG PO TABS: 1000.0000 ug | ORAL_TABLET | Freq: Every day | ORAL | Status: AC

## 2018-01-04 ENCOUNTER — Telehealth: Payer: Self-pay | Admitting: *Deleted

## 2018-01-04 MED ORDER — MONTELUKAST SODIUM 10 MG PO TABS: 10.0000 mg | ORAL_TABLET | Freq: Every day | ORAL | 3 refills | Status: DC

## 2018-01-05 NOTE — Telephone Encounter (Signed)
Noted and signed Rx

## 2018-02-01 ENCOUNTER — Other Ambulatory Visit: Payer: Self-pay | Admitting: Pulmonary Disease

## 2018-05-09 ENCOUNTER — Other Ambulatory Visit: Payer: Self-pay | Admitting: Registered Nurse

## 2018-05-09 ENCOUNTER — Other Ambulatory Visit: Payer: Self-pay | Admitting: Family Medicine

## 2018-05-13 ENCOUNTER — Other Ambulatory Visit: Payer: Self-pay | Admitting: Pulmonary Disease

## 2018-05-13 ENCOUNTER — Telehealth: Payer: Self-pay | Admitting: *Deleted

## 2018-05-13 ENCOUNTER — Encounter: Payer: Self-pay | Admitting: *Deleted

## 2018-05-13 DIAGNOSIS — J302 Other seasonal allergic rhinitis: Secondary | ICD-10-CM

## 2018-05-13 MED ORDER — MONTELUKAST SODIUM 10 MG PO TABS: 10.0000 mg | ORAL_TABLET | Freq: Every day | ORAL | 1 refills | Status: DC

## 2018-05-13 NOTE — Telephone Encounter (Signed)
Received fax from [facility] requesting refill of singulair 10mg  po qhs for patient.  Telephone message left for patient at work to verify she did want refill and # pills e.g. 30/60/90 requested.  Last fill May 2019.

## 2018-05-13 NOTE — Telephone Encounter (Signed)
Spoke with NP in clinic and advised that pt is working from home today as per her usual schedule and that RN was emailing with pt and awaiting confirmation of preferred pharmacy prior to sending new Rx. [facility]acy confirmed as [facility] and Rx sent.

## 2018-06-22 ENCOUNTER — Ambulatory Visit: Payer: Self-pay | Admitting: Registered Nurse

## 2018-06-22 ENCOUNTER — Encounter: Payer: Self-pay | Admitting: Registered Nurse

## 2018-06-22 VITALS — BP 128/84 | HR 71 | Temp 98.6°F

## 2018-06-22 DIAGNOSIS — J209 Acute bronchitis, unspecified: Secondary | ICD-10-CM

## 2018-06-22 DIAGNOSIS — J0101 Acute recurrent maxillary sinusitis: Secondary | ICD-10-CM

## 2018-06-22 DIAGNOSIS — J302 Other seasonal allergic rhinitis: Secondary | ICD-10-CM

## 2018-06-22 MED ORDER — AMOXICILLIN-POT CLAVULANATE 875-125 MG PO TABS: 1.0000 | ORAL_TABLET | Freq: Two times a day (BID) | ORAL | 0 refills | Status: DC

## 2018-06-22 MED ORDER — MONTELUKAST SODIUM 10 MG PO TABS: 10.0000 mg | ORAL_TABLET | Freq: Every day | ORAL | 3 refills | Status: DC

## 2018-06-22 MED ORDER — ALBUTEROL SULFATE HFA 108 (90 BASE) MCG/ACT IN AERS: 1.0000 | INHALATION_SPRAY | RESPIRATORY_TRACT | 0 refills | Status: DC | PRN

## 2018-06-22 MED ORDER — PREDNISONE 10 MG (21) PO TBPK: ORAL_TABLET | ORAL | 0 refills | Status: DC

## 2018-06-22 NOTE — Patient Instructions (Signed)
Allergic Rhinitis, Adult Allergic rhinitis is an allergic reaction that affects the mucous membrane inside the nose. It causes sneezing, a runny or stuffy nose, and the feeling of mucus going down the back of the throat (postnasal drip). Allergic rhinitis can be mild to severe. There are two types of allergic rhinitis:  Seasonal. This type is also called hay fever. It happens only during certain seasons.  Perennial. This type can happen at any time of the year.  What are the causes? This condition happens when the body's defense system (immune system) responds to certain harmless substances called allergens as though they were germs.  Seasonal allergic rhinitis is triggered by pollen, which can come from grasses, trees, and weeds. Perennial allergic rhinitis may be caused by:  House dust mites.  Pet dander.  Mold spores.  What are the signs or symptoms? Symptoms of this condition include:  Sneezing.  Runny or stuffy nose (nasal congestion).  Postnasal drip.  Itchy nose.  Tearing of the eyes.  Trouble sleeping.  Daytime sleepiness.  How is this diagnosed? This condition may be diagnosed based on:  Your medical history.  A physical exam.  Tests to check for related conditions, such as: ? Asthma. ? Pink eye. ? Ear infection. ? Upper respiratory infection.  Tests to find out which allergens trigger your symptoms. These may include skin or blood tests.  How is this treated? There is no cure for this condition, but treatment can help control symptoms. Treatment may include:  Taking medicines that block allergy symptoms, such as antihistamines. Medicine may be given as a shot, nasal spray, or pill.  Avoiding the allergen.  Desensitization. This treatment involves getting ongoing shots until your body becomes less sensitive to the allergen. This treatment may be done if other treatments do not help.  If taking medicine and avoiding the allergen does not work, new,  stronger medicines may be prescribed.  Follow these instructions at home:  Find out what you are allergic to. Common allergens include smoke, dust, and pollen.  Avoid the things you are allergic to. These are some things you can do to help avoid allergens: ? Replace carpet with wood, tile, or vinyl flooring. Carpet can trap dander and dust. ? Do not smoke. Do not allow smoking in your home. ? Change your heating and air conditioning filter at least once a month. ? During allergy season:  Keep windows closed as much as possible.  Plan outdoor activities when pollen counts are lowest. This is usually during the evening hours.  When coming indoors, change clothing and shower before sitting on furniture or bedding.  Take over-the-counter and prescription medicines only as told by your health care provider.  Keep all follow-up visits as told by your health care provider. This is important. Contact a health care provider if:  You have a fever.  You develop a persistent cough.  You make whistling sounds when you breathe (you wheeze).  Your symptoms interfere with your normal daily activities. Get help right away if:  You have shortness of breath. Summary  This condition can be managed by taking medicines as directed and avoiding allergens.  Contact your health care provider if you develop a persistent cough or fever.  During allergy season, keep windows closed as much as possible. This information is not intended to replace advice given to you by your health care provider. Make sure you discuss any questions you have with your health care provider. Document Released: 04/29/2001 Document Revised: 09/11/2016  Document Reviewed: 09/11/2016 Elsevier Interactive Patient Education  2018 [employer]. How to Use a Metered Dose Inhaler A metered dose inhaler is a handheld device for taking medicine that must be breathed into the lungs (inhaled). The device can be used to deliver a  variety of inhaled medicines, including:  Quick relief or rescue medicines, such as bronchodilators.  Controller medicines, such as corticosteroids.  The medicine is delivered by pushing down on a metal canister to release a preset amount of spray and medicine. Each device contains the amount of medicine that is needed for a preset number of uses (inhalations). Your health care provider may recommend that you use a spacer with your inhaler to help you take the medicine more effectively. A spacer is a plastic tube with a mouthpiece on one end and an opening that connects to the inhaler on the other end. A spacer holds the medicine in a tube for a short time, which allows you to inhale more medicine. What are the risks? If you do not use your inhaler correctly, medicine might not reach your lungs to help you breathe. Inhaler medicine can cause side effects, such as:  Mouth or throat infection.  Cough.  Hoarseness.  Headache.  Nausea and vomiting.  Lung infection (pneumonia) in people who have a lung condition called COPD.  How to use a metered dose inhaler without a spacer 1. Remove the cap from the inhaler. 2. If you are using the inhaler for the first time, shake it for 5 seconds, turn it away from your face, then release 4 puffs into the air. This is called priming. 3. Shake the inhaler for 5 seconds. 4. Position the inhaler so the top of the canister faces up. 5. Put your index finger on the top of the medicine canister. Support the bottom of the inhaler with your thumb. 6. Breathe out normally and as completely as possible, away from the inhaler. 7. Either place the inhaler between your teeth and close your lips tightly around the mouthpiece, or hold the inhaler 1-2 inches (2.5-5 cm) away from your open mouth. Keep your tongue down out of the way. If you are unsure which technique to use, ask your health care provider. 8. Press the canister down with your index finger to release  the medicine, then inhale deeply and slowly through your mouth (not your nose) until your lungs are completely filled. Inhaling should take 4-6 seconds. 9. Hold the medicine in your lungs for 5-10 seconds (10 seconds is best). This helps the medicine get into the small airways of your lungs. 10. With your lips in a tight circle (pursed), breathe out slowly. 11. Repeat steps 3-10 until you have taken the number of puffs that your health care provider directed. Wait about 1 minute between puffs or as directed. 12. Put the cap on the inhaler. 13. If you are using a steroid inhaler, rinse your mouth with water, gargle, and spit out the water. Do not swallow the water. How to use a metered dose inhaler with a spacer 1. Remove the cap from the inhaler. 2. If you are using the inhaler for the first time, shake it for 5 seconds, turn it away from your face, then release 4 puffs into the air. This is called priming. 3. Shake the inhaler for 5 seconds. 4. Place the open end of the spacer onto the inhaler mouthpiece. 5. Position the inhaler so the top of the canister faces up and the spacer mouthpiece faces you.  6. Put your index finger on the top of the medicine canister. Support the bottom of the inhaler and the spacer with your thumb. 7. Breathe out normally and as completely as possible, away from the spacer. 8. Place the spacer between your teeth and close your lips tightly around it. Keep your tongue down out of the way. 9. Press the canister down with your index finger to release the medicine, then inhale deeply and slowly through your mouth (not your nose) until your lungs are completely filled. Inhaling should take 4-6 seconds. 10. Hold the medicine in your lungs for 5-10 seconds (10 seconds is best). This helps the medicine get into the small airways of your lungs. 11. With your lips in a tight circle (pursed), breathe out slowly. 12. Repeat steps 3-11 until you have taken the number of puffs that  your health care provider directed. Wait about 1 minute between puffs or as directed. 13. Remove the spacer from the inhaler and put the cap on the inhaler. 14. If you are using a steroid inhaler, rinse your mouth with water, gargle, and spit out the water. Do not swallow the water. Follow these instructions at home:  Take your inhaled medicine only as told by your health care provider. Do not use the inhaler more than directed by your health care provider.  Keep all follow-up visits as told by your health care provider. This is important.  If your inhaler has a counter, you can check it to determine how full your inhaler is. If your inhaler does not have a counter, ask your health care provider when you will need to refill your inhaler and write the refill date on a calendar or on your inhaler canister. Note that you cannot know when an inhaler is empty by shaking it.  Follow directions on the package insert for care and cleaning of your inhaler and spacer. Contact a health care provider if:  Symptoms are only partially relieved with your inhaler.  You are having trouble using your inhaler.  You have an increase in phlegm.  You have headaches. Get help right away if:  You feel little or no relief after using your inhaler.  You have dizziness.  You have a fast heart rate.  You have chills or a fever.  You have night sweats.  There is blood in your phlegm. Summary  A metered dose inhaler is a handheld device for taking medicine that must be breathed into the lungs (inhaled).  The medicine is delivered by pushing down on a metal canister to release a preset amount of spray and medicine.  Each device contains the amount of medicine that is needed for a preset number of uses (inhalations). This information is not intended to replace advice given to you by your health care provider. Make sure you discuss any questions you have with your health care provider. Document Released:  08/04/2005 Document Revised: 06/24/2016 Document Reviewed: 06/24/2016 Elsevier Interactive Patient Education  2017 [employer] Acute Bronchitis, Adult Acute bronchitis is sudden (acute) swelling of the air tubes (bronchi) in the lungs. Acute bronchitis causes these tubes to fill with mucus, which can make it hard to breathe. It can also cause coughing or wheezing. In adults, acute bronchitis usually goes away within 2 weeks. A cough caused by bronchitis may last up to 3 weeks. Smoking, allergies, and asthma can make the condition worse. Repeated episodes of bronchitis may cause further lung problems, such as chronic obstructive pulmonary disease (COPD). What are  the causes? This condition can be caused by germs and by substances that irritate the lungs, including:  Cold and flu viruses. This condition is most often caused by the same virus that causes a cold.  Bacteria.  Exposure to tobacco smoke, dust, fumes, and air pollution.  What increases the risk? This condition is more likely to develop in people who:  Have close contact with someone with acute bronchitis.  Are exposed to lung irritants, such as tobacco smoke, dust, fumes, and vapors.  Have a weak immune system.  Have a respiratory condition such as asthma.  What are the signs or symptoms? Symptoms of this condition include:  A cough.  Coughing up clear, yellow, or green mucus.  Wheezing.  Chest congestion.  Shortness of breath.  A fever.  Body aches.  Chills.  A sore throat.  How is this diagnosed? This condition is usually diagnosed with a physical exam. During the exam, your health care provider may order tests, such as chest X-rays, to rule out other conditions. He or she may also:  Test a sample of your mucus for bacterial infection.  Check the level of oxygen in your blood. This is done to check for pneumonia.  Do a chest X-ray or lung function testing to rule out pneumonia and other  conditions.  Perform blood tests.  Your health care provider will also ask about your symptoms and medical history. How is this treated? Most cases of acute bronchitis clear up over time without treatment. Your health care provider may recommend:  Drinking more fluids. Drinking more makes your mucus thinner, which may make it easier to breathe.  Taking a medicine for a fever or cough.  Taking an antibiotic medicine.  Using an inhaler to help improve shortness of breath and to control a cough.  Using a cool mist vaporizer or humidifier to make it easier to breathe.  Follow these instructions at home: Medicines  Take over-the-counter and prescription medicines only as told by your health care provider.  If you were prescribed an antibiotic, take it as told by your health care provider. Do not stop taking the antibiotic even if you start to feel better. General instructions  Get plenty of rest.  Drink enough fluids to keep your urine clear or pale yellow.  Avoid smoking and secondhand smoke. Exposure to cigarette smoke or irritating chemicals will make bronchitis worse. If you smoke and you need help quitting, ask your health care provider. Quitting smoking will help your lungs heal faster.  Use an inhaler, cool mist vaporizer, or humidifier as told by your health care provider.  Keep all follow-up visits as told by your health care provider. This is important. How is this prevented? To lower your risk of getting this condition again:  Wash your hands often with soap and water. If soap and water are not available, use hand sanitizer.  Avoid contact with people who have cold symptoms.  Try not to touch your hands to your mouth, nose, or eyes.  Make sure to get the flu shot every year.  Contact a health care provider if:  Your symptoms do not improve in 2 weeks of treatment. Get help right away if:  You cough up blood.  You have chest pain.  You have severe shortness  of breath.  You become dehydrated.  You faint or keep feeling like you are going to faint.  You keep vomiting.  You have a severe headache.  Your fever or chills gets worse.  This information is not intended to replace advice given to you by your health care provider. Make sure you discuss any questions you have with your health care provider. Document Released: 09/11/2004 Document Revised: 02/27/2016 Document Reviewed: 01/23/2016 Elsevier Interactive Patient Education  2018 [employer]. Sinusitis, Adult Sinusitis is soreness and inflammation of your sinuses. Sinuses are hollow spaces in the bones around your face. Your sinuses are located:  Around your eyes.  In the middle of your forehead.  Behind your nose.  In your cheekbones.  Your sinuses and nasal passages are lined with a stringy fluid (mucus). Mucus normally drains out of your sinuses. When your nasal tissues become inflamed or swollen, the mucus can become trapped or blocked so air cannot flow through your sinuses. This allows bacteria, viruses, and funguses to grow, which leads to infection. Sinusitis can develop quickly and last for 7?10 days (acute) or for more than 12 weeks (chronic). Sinusitis often develops after a cold. What are the causes? This condition is caused by anything that creates swelling in the sinuses or stops mucus from draining, including:  Allergies.  Asthma.  Bacterial or viral infection.  Abnormally shaped bones between the nasal passages.  Nasal growths that contain mucus (nasal polyps).  Narrow sinus openings.  Pollutants, such as chemicals or irritants in the air.  A foreign object stuck in the nose.  A fungal infection. This is rare.  What increases the risk? The following factors may make you more likely to develop this condition:  Having allergies or asthma.  Having had a recent cold or respiratory tract infection.  Having structural deformities or blockages in your nose  or sinuses.  Having a weak immune system.  Doing a lot of swimming or diving.  Overusing nasal sprays.  Smoking.  What are the signs or symptoms? The main symptoms of this condition are pain and a feeling of pressure around the affected sinuses. Other symptoms include:  Upper toothache.  Earache.  Headache.  Bad breath.  Decreased sense of smell and taste.  A cough that may get worse at night.  Fatigue.  Fever.  Thick drainage from your nose. The drainage is often green and it may contain pus (purulent).  Stuffy nose or congestion.  Postnasal drip. This is when extra mucus collects in the throat or back of the nose.  Swelling and warmth over the affected sinuses.  Sore throat.  Sensitivity to light.  How is this diagnosed? This condition is diagnosed based on symptoms, a medical history, and a physical exam. To find out if your condition is acute or chronic, your health care provider may:  Look in your nose for signs of nasal polyps.  Tap over the affected sinus to check for signs of infection.  View the inside of your sinuses using an imaging device that has a light attached (endoscope).  If your health care provider suspects that you have chronic sinusitis, you may also:  Be tested for allergies.  Have a sample of mucus taken from your nose (nasal culture) and checked for bacteria.  Have a mucus sample examined to see if your sinusitis is related to an allergy.  If your sinusitis does not respond to treatment and it lasts longer than 8 weeks, you may have an MRI or CT scan to check your sinuses. These scans also help to determine how severe your infection is. In rare cases, a bone biopsy may be done to rule out more serious types of fungal sinus disease.  How is this treated? Treatment for sinusitis depends on the cause and whether your condition is chronic or acute. If a virus is causing your sinusitis, your symptoms will go away on their own within 10  days. You may be given medicines to relieve your symptoms, including:  Topical nasal decongestants. They shrink swollen nasal passages and let mucus drain from your sinuses.  Antihistamines. These drugs block inflammation that is triggered by allergies. This can help to ease swelling in your nose and sinuses.  Topical nasal corticosteroids. These are nasal sprays that ease inflammation and swelling in your nose and sinuses.  Nasal saline washes. These rinses can help to get rid of thick mucus in your nose.  If your condition is caused by bacteria, you will be given an antibiotic medicine. If your condition is caused by a fungus, you will be given an antifungal medicine. Surgery may be needed to correct underlying conditions, such as narrow nasal passages. Surgery may also be needed to remove polyps. Follow these instructions at home: Medicines  Take, use, or apply over-the-counter and prescription medicines only as told by your health care provider. These may include nasal sprays.  If you were prescribed an antibiotic medicine, take it as told by your health care provider. Do not stop taking the antibiotic even if you start to feel better. Hydrate and Humidify  Drink enough water to keep your urine clear or pale yellow. Staying hydrated will help to thin your mucus.  Use a cool mist humidifier to keep the humidity level in your home above 50%.  Inhale steam for 10-15 minutes, 3-4 times a day or as told by your health care provider. You can do this in the bathroom while a hot shower is running.  Limit your exposure to cool or dry air. Rest  Rest as much as possible.  Sleep with your head raised (elevated).  Make sure to get enough sleep each night. General instructions  Apply a warm, moist washcloth to your face 3-4 times a day or as told by your health care provider. This will help with discomfort.  Wash your hands often with soap and water to reduce your exposure to viruses and  other germs. If soap and water are not available, use hand sanitizer.  Do not smoke. Avoid being around people who are smoking (secondhand smoke).  Keep all follow-up visits as told by your health care provider. This is important. Contact a health care provider if:  You have a fever.  Your symptoms get worse.  Your symptoms do not improve within 10 days. Get help right away if:  You have a severe headache.  You have persistent vomiting.  You have pain or swelling around your face or eyes.  You have vision problems.  You develop confusion.  Your neck is stiff.  You have trouble breathing. This information is not intended to replace advice given to you by your health care provider. Make sure you discuss any questions you have with your health care provider. Document Released: 08/04/2005 Document Revised: 03/30/2016 Document Reviewed: 05/30/2015 Elsevier Interactive Patient Education  2018 [employer]. Sinus Rinse What is a sinus rinse? A sinus rinse is a simple home treatment that is used to rinse your sinuses with a sterile mixture of salt and water (saline solution). Sinuses are air-filled spaces in your skull behind the bones of your face and forehead that open into your nasal cavity. You will use the following:  Saline solution.  Neti pot or spray  bottle. This releases the saline solution into your nose and through your sinuses. Neti pots and spray bottles can be purchased at [employer], a health food store, or online.  When would I do a sinus rinse? A sinus rinse can help to clear mucus, dirt, dust, or pollen from the nasal cavity. You may do a sinus rinse when you have a cold, a virus, nasal allergy symptoms, a sinus infection, or stuffiness in the nose or sinuses. If you are considering a sinus rinse:  Ask your child's health care provider before performing a sinus rinse on your child.  Do not do a sinus rinse if you have had ear or nasal surgery, ear  infection, or blocked ears.  How do I do a sinus rinse?  Wash your hands.  Disinfect your device according to the directions provided and then dry it.  Use the solution that comes with your device or one that is sold separately in stores. Follow the mixing directions on the package.  Fill your device with the amount of saline solution as directed by the device instructions.  Stand over a sink and tilt your head sideways over the sink.  Place the spout of the device in your upper nostril (the one closer to the ceiling).  Gently pour or squeeze the saline solution into the nasal cavity. The liquid should drain to the lower nostril if you are not overly congested.  Gently blow your nose. Blowing too hard may cause ear pain.  Repeat in the other nostril.  Clean and rinse your device with clean water and then air-dry it. Are there risks of a sinus rinse? Sinus rinse is generally very safe and effective. However, there are a few risks, which include:  A burning sensation in the sinuses. This may happen if you do not make the saline solution as directed. Make sure to follow all directions when making the saline solution.  Infection from contaminated water. This is rare, but possible.  Nasal irritation.  This information is not intended to replace advice given to you by your health care provider. Make sure you discuss any questions you have with your health care provider. Document Released: 03/01/2014 Document Revised: 07/01/2016 Document Reviewed: 12/20/2013 Elsevier Interactive Patient Education  2017 [employer].

## 2018-06-22 NOTE — Progress Notes (Signed)
Subjective:    Patient ID: [name], female    DOB: 1961/07/22, 57 y.o.   MRN: [id number]  57y/o married Caucasian established female pt c/o maxillary sinus pain, upper teeth pain x4 days. +nasal congestion. Denies rhinorrhea. Also with bilateral otalgia. L ear has throbbing pain. R ear feels full of fluid. Reports chronic cough that has been present x3 years is normally dry, nonproductive. Now is productive and has chest congestion. Denies sore throat.  Using home meds, nothing new OTC. Albuterol inh she has been using 3-4x/day since sx onset. Not currently taking antihistamine x1 week.   Needs refill on albuterol and singulair; has enough tessalon pearles left; using saline am but not qhs and showering prior to bedtime.  Feels like she needs prednisone.  2017 amoxicillin 2018 doxycycline x 2 2019 augmentin Mar 2019     Review of Systems  Constitutional: Negative for activity change, appetite change, chills, diaphoresis, fatigue, fever and unexpected weight change.  HENT: Positive for congestion, ear pain, postnasal drip, sinus pressure and sinus pain. Negative for dental problem, drooling, ear discharge, facial swelling, hearing loss, mouth sores, nosebleeds, rhinorrhea, sneezing, sore throat, tinnitus, trouble swallowing and voice change.   Eyes: Negative for photophobia, pain, discharge, redness, itching and visual disturbance.  Respiratory: Positive for cough. Negative for choking, chest tightness, shortness of breath, wheezing and stridor.   Cardiovascular: Negative for chest pain, palpitations and leg swelling.  Gastrointestinal: Negative for abdominal distention, abdominal pain, blood in stool, constipation, diarrhea, nausea and vomiting.  Endocrine: Negative for cold intolerance and heat intolerance.  Genitourinary: Negative for difficulty urinating, dysuria and hematuria.  Musculoskeletal: Negative for arthralgias, back pain, gait problem, joint swelling, myalgias, neck pain and  neck stiffness.  Skin: Negative for color change, pallor, rash and wound.  Allergic/Immunologic: Positive for environmental allergies. Negative for food allergies.  Neurological: Negative for dizziness, tremors, seizures, syncope, facial asymmetry, speech difficulty, weakness, light-headedness, numbness and headaches.  Hematological: Negative for adenopathy. Does not bruise/bleed easily.  Psychiatric/Behavioral: Negative for agitation, behavioral problems, confusion and sleep disturbance.       Objective:   Physical Exam  Constitutional: She is oriented to person, place, and time. Vital signs are normal. She appears well-developed and well-nourished. She is active and cooperative.  Non-toxic appearance. She does not have a sickly appearance. She does not appear ill. No distress.  HENT:  Head: Normocephalic and atraumatic.  Right Ear: Hearing, external ear and ear canal normal. A middle ear effusion is present.  Left Ear: Hearing, external ear and ear canal normal. A middle ear effusion is present.  Nose: Mucosal edema and rhinorrhea present. No nose lacerations, sinus tenderness, nasal deformity, septal deviation or nasal septal hematoma. No epistaxis.  No foreign bodies. Right sinus exhibits maxillary sinus tenderness. Right sinus exhibits no frontal sinus tenderness. Left sinus exhibits maxillary sinus tenderness. Left sinus exhibits no frontal sinus tenderness.  Mouth/Throat: Uvula is midline and mucous membranes are normal. Mucous membranes are not pale, not dry and not cyanotic. She does not have dentures. No oral lesions. No trismus in the jaw. Normal dentition. No dental abscesses, uvula swelling, lacerations or dental caries. Posterior oropharyngeal edema and posterior oropharyngeal erythema present. No oropharyngeal exudate or tonsillar abscesses. No tonsillar exudate.  Thick white to clear drip from above and cobblestoning posterior pharynx; bilateral allergic shiners; nasal turbinates  with edema/erythema/clear discharge; bilateral TMs air fluid level clear  Eyes: Pupils are equal, round, and reactive to light. Conjunctivae, EOM and lids are  normal. Right eye exhibits no chemosis, no discharge, no exudate and no hordeolum. No foreign body present in the right eye. Left eye exhibits no chemosis, no discharge, no exudate and no hordeolum. No foreign body present in the left eye. Right conjunctiva is not injected. Right conjunctiva has no hemorrhage. Left conjunctiva is not injected. Left conjunctiva has no hemorrhage. No scleral icterus. Right eye exhibits normal extraocular motion and no nystagmus. Left eye exhibits normal extraocular motion and no nystagmus. Right pupil is round and reactive. Left pupil is round and reactive. Pupils are equal.  Neck: Trachea normal and normal range of motion. Neck supple. No tracheal tenderness, no spinous process tenderness and no muscular tenderness present. No neck rigidity. No tracheal deviation, no edema, no erythema and normal range of motion present. No thyroid mass and no thyromegaly present.  Cardiovascular: Normal rate, regular rhythm, S1 normal, S2 normal, normal heart sounds and intact distal pulses. PMI is not displaced. Exam reveals no gallop and no friction rub.  No murmur heard. Pulmonary/Chest: Effort normal and breath sounds normal. No accessory muscle usage or stridor. No respiratory distress. She has no decreased breath sounds. She has no wheezes. She has no rhonchi. She has no rales. She exhibits no tenderness.  Intermittent dry cough in exam room; spoke full sentences without difficulty  Abdominal: Soft. Normal appearance. She exhibits no distension, no fluid wave and no ascites. There is no rigidity and no guarding.  Musculoskeletal: Normal range of motion. She exhibits no edema or tenderness.       Right shoulder: Normal.       Left shoulder: Normal.       Right elbow: Normal.      Left elbow: Normal.       Right hip: Normal.        Left hip: Normal.       Right knee: Normal.       Left knee: Normal.       Cervical back: Normal.       Thoracic back: Normal.       Lumbar back: Normal.       Right hand: Normal.       Left hand: Normal.  Lymphadenopathy:       Head (right side): No submental, no submandibular, no tonsillar, no preauricular, no posterior auricular and no occipital adenopathy present.       Head (left side): No submental, no submandibular, no tonsillar, no preauricular, no posterior auricular and no occipital adenopathy present.    She has no cervical adenopathy.       Right cervical: No superficial cervical, no deep cervical and no posterior cervical adenopathy present.      Left cervical: No superficial cervical, no deep cervical and no posterior cervical adenopathy present.  Neurological: She is alert and oriented to person, place, and time. She has normal strength. She is not disoriented. She displays no atrophy and no tremor. No cranial nerve deficit or sensory deficit. She exhibits normal muscle tone. She displays no seizure activity. Coordination and gait normal. GCS eye subscore is 4. GCS verbal subscore is 5. GCS motor subscore is 6.  On/off exam table; in/out of chair without difficulty; gait sure and steady in hallway  Skin: Skin is warm, dry and intact. Capillary refill takes less than 2 seconds. No abrasion, no bruising, no burn, no ecchymosis, no laceration, no lesion, no petechiae and no rash noted. She is not diaphoretic. No cyanosis or erythema. No pallor. Nails show no  clubbing.  Psychiatric: She has a normal mood and affect. Her speech is normal and behavior is normal. Judgment and thought content normal. She is not actively hallucinating. Cognition and memory are normal. She is attentive.  Nursing note and vitals reviewed.         Assessment & Plan:  A-acute bronchitis, seasonal allergic rhinitis, recurrent acute maxillary sinusitis  P-Restart flonase 1 spray each nostril BID,  saline 2 sprays each nostril q2h wa prn congestion.  If no improvement with 48 hours of saline and flonase use start augmentin 875mg  po BID x 10 days #20 RF0 dispensed from [facility] to patient start if no relief with increasing nasal saline and prednisone x 48 hours.  Denied personal or family history of ENT cancer.  Shower BID especially prior to bed. No evidence of systemic bacterial infection, non toxic and well hydrated.  I do not see where any further testing or imaging is necessary at this time.   I will suggest supportive care, rest, good hygiene and encourage the patient to take adequate fluids.  The patient is to return to clinic or EMERGENCY ROOM if symptoms worsen or change significantly.  Exitcare handout on sinusitis and sinus rinse   Patient verbalized agreement and understanding of treatment plan and had no further questions at this time.   P2:  Hand washing and cover cough  Continue symbicort as prescribed.  May need to increase albuterol use to at least am/qhs on schedule then other times prn.  Tessalon pearles 200mg  po TID prn cough. Cough lozenges po q2h prn cough   Prednisone taper 10mg  (60/50/40/30/20/10mg ) po daily with breakfast #21 RF0 dispensed from [facility].  Discussed possible side effects increased/decreased appetite, difficulty sleeping, increased blood sugar, increased blood pressure and heart rate.  Albuterol MDI 54mcg 1-2 puffs po q4-6h prn protracted cough/wheeze #1 RF1 side effect increased heart rate. Bronchitis simple, community acquired, may have started as viral (probably respiratory syncytial, parainfluenza, influenza, or adenovirus), but now evidence of acute purulent bronchitis with resultant bronchial edema and mucus formation.  Viruses are the most common cause of bronchial inflammation in otherwise healthy adults with acute bronchitis.  The appearance of sputum is not predictive of whether a bacterial infection is present.  Purulent sputum is most often caused by viral  infections.  There are a small portion of those caused by non-viral agents being Mycoplama pneumonia.  Microscopic examination or C&S of sputum in the healthy adult with acute bronchitis is generally not helpful (usually negative or normal respiratory flora) other considerations being cough from upper respiratory tract infections, sinusitis or allergic syndromes (mild asthma or viral pneumonia).  Differential Diagnoses:  reactive airway disease (asthma, allergic aspergillosis (eosinophilia), chronic bronchitis, respiratory infection (sinusitis, common cold, pneumonia), congestive heart failure, reflux esophagitis, bronchogenic tumor, aspiration syndromes and/or exposure to pulmonary irritants/smoke.   Without high fever, severe dyspnea, lack of physical findings or other risk factors, I will hold on a chest radiograph and CBC at this time.  I discussed that approximately 50% of patients with acute bronchitis have a cough that lasts up to three weeks, and 25% for over a month.  Tylenol 500mg  one to two tablets every four to six hours as needed for fever or myalgias.  No aspirin. Exitcare handout on bronchitis and inhaler use.  ER if hemopthysis, SOB, worst chest pain of life.   Patient instructed to follow up in one week or sooner if symptoms worsen.  Patient verbalized agreement and understanding of treatment plan.  P2:  hand washing and cover cough  Refilled singulair 10mg  po QHS #90 RF3 electronic Rx to her pharmacy of choice. Continue flonase, azelastine, nasal saline. Patient may use normal saline nasal spray 2 sprays each nostril q2h wa as needed. flonase 42mcg 1 spray each nostril BID.  Patient denied personal or family history of ENT cancer.  OTC antihistamine of choice zyrtec 10mg  po daily.  Avoid triggers if possible.  Shower prior to bedtime if exposed to triggers.  If allergic dust/dust mites recommend mattress/pillow covers/encasements; washing linens, vacuuming, sweeping, dusting weekly.  Call or  return to clinic as needed if these symptoms worsen or fail to improve as anticipated.   Exitcare handout on allergic rhinitis and sinus rinse.  Patient verbalized understanding of instructions, agreed with plan of care and had no further questions at this time.  P2:  Avoidance and hand washing.

## 2018-06-29 ENCOUNTER — Encounter: Payer: Self-pay | Admitting: Family Medicine

## 2018-07-01 ENCOUNTER — Encounter: Payer: Self-pay | Admitting: Family Medicine

## 2018-07-01 ENCOUNTER — Telehealth: Payer: Self-pay | Admitting: Family Medicine

## 2018-07-01 NOTE — Telephone Encounter (Signed)
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 FYI to Dr Damita Dunnings.

## 2018-07-01 NOTE — Telephone Encounter (Signed)
Noted. Thanks.

## 2018-07-01 NOTE — Telephone Encounter (Signed)
See my chart message pasted below.  Please call patient and triage her situation.  She likely needs office visit so we can talk about her situation in general and check her out.  If she needs a note for work I can likely do it at the visit.  Thanks.  Dr Damita Dunnings-    Good morning!  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  Standing too long in one place kills my back and makes my feet hurt worst than they do now.  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     Thank you so much!  Sincerely,  [name]

## 2018-07-02 ENCOUNTER — Ambulatory Visit (INDEPENDENT_AMBULATORY_CARE_PROVIDER_SITE_OTHER)
Admission: RE | Admit: 2018-07-02 | Discharge: 2018-07-02 | Disposition: A | Discharge status: Home / Self Care | Payer: PRIVATE HEALTH INSURANCE | Source: Ambulatory Visit | Attending: Family Medicine | Admitting: Family Medicine

## 2018-07-02 ENCOUNTER — Ambulatory Visit: Payer: PRIVATE HEALTH INSURANCE | Admitting: Family Medicine

## 2018-07-02 ENCOUNTER — Encounter: Payer: Self-pay | Admitting: Family Medicine

## 2018-07-02 VITALS — BP 124/82 | HR 80 | Temp 98.5°F | Wt 282.0 lb

## 2018-07-02 DIAGNOSIS — G629 Polyneuropathy, unspecified: Secondary | ICD-10-CM

## 2018-07-02 DIAGNOSIS — M25562 Pain in left knee: Secondary | ICD-10-CM

## 2018-07-02 LAB — VITAMIN B12: Vitamin B-12: 408 pg/mL (ref 211–911)

## 2018-07-02 MED ORDER — DICLOFENAC SODIUM 1 % TD GEL: 2.0000 g | Freq: Four times a day (QID) | TRANSDERMAL | 3 refills | Status: DC | PRN

## 2018-07-02 MED ORDER — GABAPENTIN 100 MG PO CAPS: ORAL_CAPSULE | ORAL | Status: DC

## 2018-07-02 NOTE — Progress Notes (Signed)
She can't get through her work at [employer] without sig L knee pain.  She can usually do her routine work but she usually isn't working at [employer].  Not much knee swelling but throbbing pain.  Clearly worse with weather changes.    She used voltaren gel for her knee with relief.  No other trauma but she took an awkward step working in the yard recently.  She can have sig pain with ROM but not locking.    B ankle puffy with compression stockings, with some relief of edema.   Gabapentin helped some with neuropathy pain.  She is still off B12.  Due for recheck lab.  Meds, vitals, and allergies reviewed.   ROS: Per HPI unless specifically indicated in ROS section   nad ncat rrr ctab L knee with normal ROM but ttp inferior/lateral to L patella.  Patella itself is not tender to palpation.  Medial lateral joint line not tender.  She has minimal crepitus on range of motion.  Able to bear weight.

## 2018-07-02 NOTE — Patient Instructions (Addendum)
Go to the lab on the way out.  We'll contact you with your lab and xray report. Use diclofenac gel as needed.  Ice your knee 5 minutes at a time.  Don't use heat.   Talk to your supervisor at work and update me as needed.  Take care.  Glad to see you.

## 2018-07-04 DIAGNOSIS — M25562 Pain in left knee: Secondary | ICD-10-CM | POA: Insufficient documentation

## 2018-07-04 NOTE — Assessment & Plan Note (Signed)
Recheck B12 level today

## 2018-07-04 NOTE — Assessment & Plan Note (Signed)
Discussed limiting oral NSAIDs.  It seemed that Voltaren gel helped a lot.  She can use that in the meantime.  Ice as needed.  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  Check plain film today.  See notes on imaging.

## 2018-07-20 ENCOUNTER — Encounter: Payer: Self-pay | Admitting: Registered Nurse

## 2018-07-20 ENCOUNTER — Ambulatory Visit: Payer: Self-pay | Admitting: Registered Nurse

## 2018-07-20 VITALS — BP 134/92 | HR 77 | Temp 98.3°F

## 2018-07-20 DIAGNOSIS — J0101 Acute recurrent maxillary sinusitis: Secondary | ICD-10-CM

## 2018-07-20 DIAGNOSIS — J209 Acute bronchitis, unspecified: Secondary | ICD-10-CM

## 2018-07-20 MED ORDER — DOXYCYCLINE HYCLATE 100 MG PO TABS: 100.0000 mg | ORAL_TABLET | Freq: Two times a day (BID) | ORAL | 0 refills | Status: AC

## 2018-07-20 NOTE — Progress Notes (Signed)
Subjective:    Patient ID: [name], female    DOB: 08/09/1961, 57 y.o.   MRN: [id number]  57y/o Caucasian established female pt c/o sinus pain/pressure, chest congestion, productive cough x2-3 days. Was at the beach with [place] when sx started. Son with similar sx but also with fever. Neg strep, flu, mono. Waiting for strep Cx.  Stated mucous green when she is able to expectorate.  Had augmentin and prednisone last month and was feeling better prior to vacation at beach.  Has been using both of her inhalers.  Noted wheezing at night.     Review of Systems  Constitutional: Positive for fatigue. Negative for activity change, appetite change, chills, diaphoresis, fever and unexpected weight change.  HENT: Positive for congestion, ear pain, postnasal drip, rhinorrhea, sinus pressure, sinus pain and sore throat. Negative for dental problem, drooling, ear discharge, facial swelling, hearing loss, mouth sores, nosebleeds, sneezing, tinnitus, trouble swallowing and voice change.   Eyes: Negative for photophobia, pain, discharge, redness, itching and visual disturbance.  Respiratory: Positive for cough and wheezing. Negative for choking, chest tightness, shortness of breath and stridor.   Cardiovascular: Negative for chest pain, palpitations and leg swelling.  Gastrointestinal: Negative for abdominal distention, abdominal pain, diarrhea, nausea and vomiting.  Endocrine: Negative for cold intolerance and heat intolerance.  Genitourinary: Negative for difficulty urinating, dysuria and hematuria.  Musculoskeletal: Negative for arthralgias, back pain, gait problem, joint swelling, myalgias, neck pain and neck stiffness.  Skin: Negative for color change, pallor, rash and wound.  Allergic/Immunologic: Positive for environmental allergies. Negative for food allergies.  Neurological: Positive for headaches. Negative for dizziness, tremors, seizures, syncope, facial asymmetry, speech difficulty,  weakness, light-headedness and numbness.  Hematological: Negative for adenopathy. Does not bruise/bleed easily.  Psychiatric/Behavioral: Positive for sleep disturbance. Negative for agitation, behavioral problems and confusion.       Objective:   Physical Exam  Constitutional: She is oriented to person, place, and time. Vital signs are normal. She appears well-developed and well-nourished. She is active and cooperative.  Non-toxic appearance. She does not have a sickly appearance. She appears ill. No distress.  HENT:  Head: Normocephalic and atraumatic.  Right Ear: Hearing, external ear and ear canal normal. No drainage, swelling or tenderness. A middle ear effusion is present.  Left Ear: Hearing, external ear and ear canal normal. No drainage, swelling or tenderness. A middle ear effusion is present.  Nose: Mucosal edema and rhinorrhea present. No nose lacerations, sinus tenderness, nasal deformity, septal deviation or nasal septal hematoma. No epistaxis.  No foreign bodies. Right sinus exhibits maxillary sinus tenderness and frontal sinus tenderness. Left sinus exhibits maxillary sinus tenderness and frontal sinus tenderness.  Mouth/Throat: Uvula is midline and mucous membranes are normal. Mucous membranes are not pale, not dry and not cyanotic. She does not have dentures. No oral lesions. No trismus in the jaw. Normal dentition. No dental abscesses, uvula swelling, lacerations or dental caries. Posterior oropharyngeal edema and posterior oropharyngeal erythema present. No oropharyngeal exudate or tonsillar abscesses. Tonsils are 1+ on the right. Tonsils are 1+ on the left. No tonsillar exudate.  Maxillary greater than frontal sinuses TTP bilaterally; bilateral allergic shiners; bilateral TMs air fluid level clear; bilateral nasal turbinates edema/erythema clear discharge; cobblestoning posterior pharynx  Eyes: Pupils are equal, round, and reactive to light. Conjunctivae, EOM and lids are normal.  Right eye exhibits no chemosis, no discharge, no exudate and no hordeolum. No foreign body present in the right eye. Left eye exhibits no  chemosis, no discharge, no exudate and no hordeolum. No foreign body present in the left eye. Right conjunctiva is not injected. Right conjunctiva has no hemorrhage. Left conjunctiva is not injected. Left conjunctiva has no hemorrhage. No scleral icterus. Right eye exhibits normal extraocular motion and no nystagmus. Left eye exhibits normal extraocular motion and no nystagmus. Right pupil is round and reactive. Left pupil is round and reactive. Pupils are equal.  Neck: Trachea normal, normal range of motion and phonation normal. Neck supple. No tracheal tenderness, no spinous process tenderness and no muscular tenderness present. No neck rigidity. No tracheal deviation, no edema, no erythema and normal range of motion present. No thyroid mass and no thyromegaly present.  Cardiovascular: Normal rate, regular rhythm, S1 normal, S2 normal, normal heart sounds and intact distal pulses. PMI is not displaced. Exam reveals no gallop, no distant heart sounds and no friction rub.  No murmur heard. Pulmonary/Chest: Effort normal. No accessory muscle usage or stridor. No respiratory distress. She has decreased breath sounds in the right lower field and the left lower field. She has wheezes in the right middle field and the left middle field. She has no rhonchi. She has no rales. She exhibits no tenderness.  Faint expiratory wheeze middle lobes intermittent; mild nonproductive cough observed in exam room room intermittent; spoke full sentences without difficulty  Abdominal: Soft. Normal appearance. She exhibits no distension, no fluid wave and no ascites. There is no rigidity and no guarding.  Musculoskeletal: Normal range of motion. She exhibits no edema or tenderness.       Right shoulder: Normal.       Left shoulder: Normal.       Right elbow: Normal.      Left elbow: Normal.        Right hip: Normal.       Left hip: Normal.       Right knee: Normal.       Left knee: Normal.       Cervical back: Normal.       Thoracic back: Normal.       Lumbar back: Normal.       Right hand: Normal.       Left hand: Normal.  Lymphadenopathy:       Head (right side): No submental, no submandibular, no tonsillar, no preauricular, no posterior auricular and no occipital adenopathy present.       Head (left side): No submental, no submandibular, no tonsillar, no preauricular, no posterior auricular and no occipital adenopathy present.    She has no cervical adenopathy.       Right cervical: No superficial cervical, no deep cervical and no posterior cervical adenopathy present.      Left cervical: No superficial cervical, no deep cervical and no posterior cervical adenopathy present.  Neurological: She is alert and oriented to person, place, and time. She has normal strength. She is not disoriented. She displays no atrophy and no tremor. No cranial nerve deficit or sensory deficit. She exhibits normal muscle tone. She displays no seizure activity. Coordination and gait normal. GCS eye subscore is 4. GCS verbal subscore is 5. GCS motor subscore is 6.  On/off exam table; in/out of chair without difficulty; gait sure and steady in hallway  Skin: Skin is warm, dry and intact. Capillary refill takes less than 2 seconds. No abrasion, no bruising, no burn, no ecchymosis, no laceration, no lesion, no petechiae and no rash noted. She is not diaphoretic. No cyanosis or erythema. No  pallor. Nails show no clubbing.  Psychiatric: She has a normal mood and affect. Her speech is normal and behavior is normal. Judgment and thought content normal. She is not actively hallucinating. Cognition and memory are normal. She is attentive.  Nursing note and vitals reviewed.         Assessment & Plan:  A-acute recurrent maxillary sinusitis and bronchitis  P-Continue singulair 10mg  po qhs patient stated  she does not require refill.   Continue flonase 1 spray each nostril BID, saline 2 sprays each nostril q2h wa prn congestion given 1 bottle from clinic stock. Consider phenylephrine 10mg  po Q6h prn rhinitis given 4 UD from clinic stock.  Start doxycycline 100mg  po BID x 10 days #20 RF0 dispensed from [facility] to patient  Zyrtec 10mg  po daily continue.  Denied personal or family history of ENT cancer.  Shower BID especially prior to bed. No evidence of systemic bacterial infection, non toxic and well hydrated.  I do not see where any further testing or imaging is necessary at this time.   I will suggest supportive care, rest, good hygiene and encourage the patient to take adequate fluids.  The patient is to return to clinic or EMERGENCY ROOM if symptoms worsen or change significantly.  Exitcare handout on sinusitis and sinus rinse.  Patient verbalized agreement and understanding of treatment plan and had no further questions at this time.   P2:  Hand washing and cover cough  Use symbicort every day as prescribed for the next 3 weeks NOT PRN and albuterol prn at least am/pm on schedule.  Try to stay inside and no extended periods outside in wind/cold weather.  Consider prednisone taper if no improvement with doxycyline and regular daily use of prescribed MDIs.  May use OTC cough medicine per manufacturer's instructions.  Rx Doxycycline 100mg  po BID x 10 days #20 RF0 dispensed from [facility]. Discussed to use sunscreen/protective clothing as increased risk of sunburn and most common side effect stomach upset take medication with food. Cough lozenges po q2h prn cough given 8 UD from clinic stock. Albuterol MDI 34mcg 1-2 puffs po q4-6h prn protracted cough/wheeze at home side effect increased heart rate. Bronchitis simple, community acquired, may have started as viral (probably respiratory syncytial, parainfluenza, influenza, or adenovirus), but now evidence of acute purulent bronchitis with resultant bronchial edema and mucus  formation.  Viruses are the most common cause of bronchial inflammation in otherwise healthy adults with acute bronchitis.  The appearance of sputum is not predictive of whether a bacterial infection is present.  Purulent sputum is most often caused by viral infections.  There are a small portion of those caused by non-viral agents being Mycoplama pneumonia.  Microscopic examination or C&S of sputum in the healthy adult with acute bronchitis is generally not helpful (usually negative or normal respiratory flora) other considerations being cough from upper respiratory tract infections, sinusitis or allergic syndromes (mild asthma or viral pneumonia).  Differential Diagnoses:  reactive airway disease (asthma, allergic aspergillosis (eosinophilia), chronic bronchitis, respiratory infection (sinusitis, common cold, pneumonia), congestive heart failure, reflux esophagitis, bronchogenic tumor, aspiration syndromes and/or exposure to pulmonary irritants/smoke.  I will order Doxycycline 100mg  two times a day for ten days for possible Mycoplamsa.  Without high fever, severe dyspnea, lack of physical findings or other risk factors, I will hold on a chest radiograph and CBC at this time.  I discussed that approximately 50% of patients with acute bronchitis have a cough that lasts up to three weeks, and 25%  for over a month.  Tylenol 500mg  one to two tablets every four to six hours as needed for fever or myalgias.  No aspirin. Exitcare handout on bronchitis and inhaler use.  ER if hemopthysis, SOB, worst chest pain of life.   Patient instructed to follow up in 2 days for re-evaluation or sooner if symptoms worsen.  Patient verbalized agreement and understanding of treatment plan and had no further questions at this time.  P2:  hand washing and cover cough

## 2018-07-20 NOTE — Patient Instructions (Signed)
Nonallergic Rhinitis Nonallergic rhinitis is a condition that causes symptoms that affect the nose, such as a runny nose and a stuffed-up nose (nasal congestion) that can make it hard to breathe through the nose. This condition is different from having an allergy (allergic rhinitis). Allergic rhinitis occurs when the body's defense system (immune system) reacts to a substance that you are allergic to (allergen), such as pollen, pet dander, mold, or dust. Nonallergic rhinitis has many similar symptoms, but it is not caused by allergens. Nonallergic rhinitis can be a short-term or long-term problem. What are the causes? This condition can be caused by many different things. Some common types of nonallergic rhinitis include: Infectious rhinitis  This is usually due to an infection in the upper respiratory tract. Vasomotor rhinitis  This is the most common type of long-term nonallergic rhinitis.  It is caused by too much blood flow through the nose, which makes the tissue inside of the nose swell.  Symptoms are often triggered by strong odors, cold air, stress, drinking alcohol, cigarette smoke, or changes in the weather. Occupational rhinitis  This type is caused by triggers in the workplace, such as chemicals, dusts, animal dander, or air pollution. Hormonal rhinitis  This type occurs in women as a result of an increase in the female hormone estrogen.  It may occur during pregnancy, puberty, and menstrual cycles.  Symptoms improve when estrogen levels drop. Drug-induced rhinitis Several drugs can cause nonallergic rhinitis, including:  Medicines that are used to treat high blood pressure, heart disease, and Parkinson disease.  Aspirin and NSAIDs.  Over-the-counter nasal decongestant sprays. These can cause a type of nonallergic rhinitis (rhinitis medicamentosa) when they are used for more than a few days.  Nonallergic rhinitis with eosinophilia syndrome (NARES)  This type is caused  by having too much of a certain type of white blood cell (eosinophil). Nonallergic rhinitis can also be caused by a reaction to eating hot or spicy foods. This does not usually cause long-term symptoms. In some cases, the cause of nonallergic rhinitis is not known. What increases the risk? You are more likely to develop this condition if:  You are 81-49 years of age.  You are a woman. Women are twice as likely to have this condition.  What are the signs or symptoms? Common symptoms of this condition include:  Nasal congestion.  Runny nose.  The feeling of mucus going down the back of the throat (postnasal drip).  Trouble sleeping at night and daytime sleepiness.  Less common symptoms include:  Sneezing.  Coughing.  Itchy nose.  Bloodshot eyes.  How is this diagnosed? This condition may be diagnosed based on:  Your symptoms and medical history.  A physical exam.  Allergy testing to rule out allergic rhinitis. You may have skin tests or blood tests.  In some cases, the health care provider may take a swab of nasal secretions to look for an increased number of eosinophils. This would be done to confirm a diagnosis of NARES. How is this treated? Treatment for this condition depends on the cause. No single treatment works for everyone. Work with your health care provider to find the best treatment for you. Treatment may include:  Avoiding the things that trigger your symptoms.  Using medicines to relieve congestion, such as: ? Steroid nasal spray. There are many types. You may need to try a few to find out which one works best. ? Decongestant medicine. This may be an oral medicine or a nasal spray. These  medicines are only used for a short time.  Using medicines to relieve a runny nose. These may include antihistamine medicines or anticholinergic nasal sprays.  Surgery to remove tissue from inside the nose may be needed in severe cases if the condition has not improved  after 6-12 months of medical treatment. Follow these instructions at home:  Take or use over-the-counter and prescription medicines only as told by your health care provider. Do not stop using your medicine even if you start to feel better.  Use salt-water (saline) rinses or other solutions (nasal washes or irrigations) to wash or rinse out the inside of your nose as told by your health care provider.  Do not take NSAIDs or medicines that contain aspirin if they make your symptoms worse.  Do not drink alcohol if it makes your symptoms worse.  Do not use any tobacco products, such as cigarettes, chewing tobacco, and e-cigarettes. If you need help quitting, ask your health care provider.  Avoid secondhand smoke.  Get some exercise every day. Exercise may help reduce symptoms of nonallergic rhinitis for some people. Ask your health care provider how much exercise and what types of exercise are safe for you.  Sleep with the head of your bed raised (elevated). This may reduce nighttime nasal congestion.  Keep all follow-up visits as told by your health care provider. This is important. Contact a health care provider if:  You have a fever.  Your symptoms are getting worse at home.  Your symptoms are not responding to medicine.  You develop new symptoms, especially a headache or nosebleed. This information is not intended to replace advice given to you by your health care provider. Make sure you discuss any questions you have with your health care provider. Document Released: 11/26/2015 Document Revised: 01/10/2016 Document Reviewed: 10/25/2015 Elsevier Interactive Patient Education  2018 [employer]. Sinusitis, Adult Sinusitis is soreness and inflammation of your sinuses. Sinuses are hollow spaces in the bones around your face. Your sinuses are located:  Around your eyes.  In the middle of your forehead.  Behind your nose.  In your cheekbones.  Your sinuses and nasal passages  are lined with a stringy fluid (mucus). Mucus normally drains out of your sinuses. When your nasal tissues become inflamed or swollen, the mucus can become trapped or blocked so air cannot flow through your sinuses. This allows bacteria, viruses, and funguses to grow, which leads to infection. Sinusitis can develop quickly and last for 7?10 days (acute) or for more than 12 weeks (chronic). Sinusitis often develops after a cold. What are the causes? This condition is caused by anything that creates swelling in the sinuses or stops mucus from draining, including:  Allergies.  Asthma.  Bacterial or viral infection.  Abnormally shaped bones between the nasal passages.  Nasal growths that contain mucus (nasal polyps).  Narrow sinus openings.  Pollutants, such as chemicals or irritants in the air.  A foreign object stuck in the nose.  A fungal infection. This is rare.  What increases the risk? The following factors may make you more likely to develop this condition:  Having allergies or asthma.  Having had a recent cold or respiratory tract infection.  Having structural deformities or blockages in your nose or sinuses.  Having a weak immune system.  Doing a lot of swimming or diving.  Overusing nasal sprays.  Smoking.  What are the signs or symptoms? The main symptoms of this condition are pain and a feeling of pressure around  the affected sinuses. Other symptoms include:  Upper toothache.  Earache.  Headache.  Bad breath.  Decreased sense of smell and taste.  A cough that may get worse at night.  Fatigue.  Fever.  Thick drainage from your nose. The drainage is often green and it may contain pus (purulent).  Stuffy nose or congestion.  Postnasal drip. This is when extra mucus collects in the throat or back of the nose.  Swelling and warmth over the affected sinuses.  Sore throat.  Sensitivity to light.  How is this diagnosed? This condition is  diagnosed based on symptoms, a medical history, and a physical exam. To find out if your condition is acute or chronic, your health care provider may:  Look in your nose for signs of nasal polyps.  Tap over the affected sinus to check for signs of infection.  View the inside of your sinuses using an imaging device that has a light attached (endoscope).  If your health care provider suspects that you have chronic sinusitis, you may also:  Be tested for allergies.  Have a sample of mucus taken from your nose (nasal culture) and checked for bacteria.  Have a mucus sample examined to see if your sinusitis is related to an allergy.  If your sinusitis does not respond to treatment and it lasts longer than 8 weeks, you may have an MRI or CT scan to check your sinuses. These scans also help to determine how severe your infection is. In rare cases, a bone biopsy may be done to rule out more serious types of fungal sinus disease. How is this treated? Treatment for sinusitis depends on the cause and whether your condition is chronic or acute. If a virus is causing your sinusitis, your symptoms will go away on their own within 10 days. You may be given medicines to relieve your symptoms, including:  Topical nasal decongestants. They shrink swollen nasal passages and let mucus drain from your sinuses.  Antihistamines. These drugs block inflammation that is triggered by allergies. This can help to ease swelling in your nose and sinuses.  Topical nasal corticosteroids. These are nasal sprays that ease inflammation and swelling in your nose and sinuses.  Nasal saline washes. These rinses can help to get rid of thick mucus in your nose.  If your condition is caused by bacteria, you will be given an antibiotic medicine. If your condition is caused by a fungus, you will be given an antifungal medicine. Surgery may be needed to correct underlying conditions, such as narrow nasal passages. Surgery may also  be needed to remove polyps. Follow these instructions at home: Medicines  Take, use, or apply over-the-counter and prescription medicines only as told by your health care provider. These may include nasal sprays.  If you were prescribed an antibiotic medicine, take it as told by your health care provider. Do not stop taking the antibiotic even if you start to feel better. Hydrate and Humidify  Drink enough water to keep your urine clear or pale yellow. Staying hydrated will help to thin your mucus.  Use a cool mist humidifier to keep the humidity level in your home above 50%.  Inhale steam for 10-15 minutes, 3-4 times a day or as told by your health care provider. You can do this in the bathroom while a hot shower is running.  Limit your exposure to cool or dry air. Rest  Rest as much as possible.  Sleep with your head raised (elevated).  Make sure  to get enough sleep each night. General instructions  Apply a warm, moist washcloth to your face 3-4 times a day or as told by your health care provider. This will help with discomfort.  Wash your hands often with soap and water to reduce your exposure to viruses and other germs. If soap and water are not available, use hand sanitizer.  Do not smoke. Avoid being around people who are smoking (secondhand smoke).  Keep all follow-up visits as told by your health care provider. This is important. Contact a health care provider if:  You have a fever.  Your symptoms get worse.  Your symptoms do not improve within 10 days. Get help right away if:  You have a severe headache.  You have persistent vomiting.  You have pain or swelling around your face or eyes.  You have vision problems.  You develop confusion.  Your neck is stiff.  You have trouble breathing. This information is not intended to replace advice given to you by your health care provider. Make sure you discuss any questions you have with your health care  provider. Document Released: 08/04/2005 Document Revised: 03/30/2016 Document Reviewed: 05/30/2015 Elsevier Interactive Patient Education  2018 [employer]. Acute Bronchitis, Adult Acute bronchitis is sudden (acute) swelling of the air tubes (bronchi) in the lungs. Acute bronchitis causes these tubes to fill with mucus, which can make it hard to breathe. It can also cause coughing or wheezing. In adults, acute bronchitis usually goes away within 2 weeks. A cough caused by bronchitis may last up to 3 weeks. Smoking, allergies, and asthma can make the condition worse. Repeated episodes of bronchitis may cause further lung problems, such as chronic obstructive pulmonary disease (COPD). What are the causes? This condition can be caused by germs and by substances that irritate the lungs, including:  Cold and flu viruses. This condition is most often caused by the same virus that causes a cold.  Bacteria.  Exposure to tobacco smoke, dust, fumes, and air pollution.  What increases the risk? This condition is more likely to develop in people who:  Have close contact with someone with acute bronchitis.  Are exposed to lung irritants, such as tobacco smoke, dust, fumes, and vapors.  Have a weak immune system.  Have a respiratory condition such as asthma.  What are the signs or symptoms? Symptoms of this condition include:  A cough.  Coughing up clear, yellow, or green mucus.  Wheezing.  Chest congestion.  Shortness of breath.  A fever.  Body aches.  Chills.  A sore throat.  How is this diagnosed? This condition is usually diagnosed with a physical exam. During the exam, your health care provider may order tests, such as chest X-rays, to rule out other conditions. He or she may also:  Test a sample of your mucus for bacterial infection.  Check the level of oxygen in your blood. This is done to check for pneumonia.  Do a chest X-ray or lung function testing to rule out  pneumonia and other conditions.  Perform blood tests.  Your health care provider will also ask about your symptoms and medical history. How is this treated? Most cases of acute bronchitis clear up over time without treatment. Your health care provider may recommend:  Drinking more fluids. Drinking more makes your mucus thinner, which may make it easier to breathe.  Taking a medicine for a fever or cough.  Taking an antibiotic medicine.  Using an inhaler to help improve shortness of  breath and to control a cough.  Using a cool mist vaporizer or humidifier to make it easier to breathe.  Follow these instructions at home: Medicines  Take over-the-counter and prescription medicines only as told by your health care provider.  If you were prescribed an antibiotic, take it as told by your health care provider. Do not stop taking the antibiotic even if you start to feel better. General instructions  Get plenty of rest.  Drink enough fluids to keep your urine clear or pale yellow.  Avoid smoking and secondhand smoke. Exposure to cigarette smoke or irritating chemicals will make bronchitis worse. If you smoke and you need help quitting, ask your health care provider. Quitting smoking will help your lungs heal faster.  Use an inhaler, cool mist vaporizer, or humidifier as told by your health care provider.  Keep all follow-up visits as told by your health care provider. This is important. How is this prevented? To lower your risk of getting this condition again:  Wash your hands often with soap and water. If soap and water are not available, use hand sanitizer.  Avoid contact with people who have cold symptoms.  Try not to touch your hands to your mouth, nose, or eyes.  Make sure to get the flu shot every year.  Contact a health care provider if:  Your symptoms do not improve in 2 weeks of treatment. Get help right away if:  You cough up blood.  You have chest pain.  You  have severe shortness of breath.  You become dehydrated.  You faint or keep feeling like you are going to faint.  You keep vomiting.  You have a severe headache.  Your fever or chills gets worse. This information is not intended to replace advice given to you by your health care provider. Make sure you discuss any questions you have with your health care provider. Document Released: 09/11/2004 Document Revised: 02/27/2016 Document Reviewed: 01/23/2016 Elsevier Interactive Patient Education  2018 [employer]. How to Use a Metered Dose Inhaler A metered dose inhaler is a handheld device for taking medicine that must be breathed into the lungs (inhaled). The device can be used to deliver a variety of inhaled medicines, including:  Quick relief or rescue medicines, such as bronchodilators.  Controller medicines, such as corticosteroids.  The medicine is delivered by pushing down on a metal canister to release a preset amount of spray and medicine. Each device contains the amount of medicine that is needed for a preset number of uses (inhalations). Your health care provider may recommend that you use a spacer with your inhaler to help you take the medicine more effectively. A spacer is a plastic tube with a mouthpiece on one end and an opening that connects to the inhaler on the other end. A spacer holds the medicine in a tube for a short time, which allows you to inhale more medicine. What are the risks? If you do not use your inhaler correctly, medicine might not reach your lungs to help you breathe. Inhaler medicine can cause side effects, such as:  Mouth or throat infection.  Cough.  Hoarseness.  Headache.  Nausea and vomiting.  Lung infection (pneumonia) in people who have a lung condition called COPD.  How to use a metered dose inhaler without a spacer 1. Remove the cap from the inhaler. 2. If you are using the inhaler for the first time, shake it for 5 seconds, turn it  away from your face, then release  4 puffs into the air. This is called priming. 3. Shake the inhaler for 5 seconds. 4. Position the inhaler so the top of the canister faces up. 5. Put your index finger on the top of the medicine canister. Support the bottom of the inhaler with your thumb. 6. Breathe out normally and as completely as possible, away from the inhaler. 7. Either place the inhaler between your teeth and close your lips tightly around the mouthpiece, or hold the inhaler 1-2 inches (2.5-5 cm) away from your open mouth. Keep your tongue down out of the way. If you are unsure which technique to use, ask your health care provider. 8. Press the canister down with your index finger to release the medicine, then inhale deeply and slowly through your mouth (not your nose) until your lungs are completely filled. Inhaling should take 4-6 seconds. 9. Hold the medicine in your lungs for 5-10 seconds (10 seconds is best). This helps the medicine get into the small airways of your lungs. 10. With your lips in a tight circle (pursed), breathe out slowly. 11. Repeat steps 3-10 until you have taken the number of puffs that your health care provider directed. Wait about 1 minute between puffs or as directed. 12. Put the cap on the inhaler. 13. If you are using a steroid inhaler, rinse your mouth with water, gargle, and spit out the water. Do not swallow the water. How to use a metered dose inhaler with a spacer 1. Remove the cap from the inhaler. 2. If you are using the inhaler for the first time, shake it for 5 seconds, turn it away from your face, then release 4 puffs into the air. This is called priming. 3. Shake the inhaler for 5 seconds. 4. Place the open end of the spacer onto the inhaler mouthpiece. 5. Position the inhaler so the top of the canister faces up and the spacer mouthpiece faces you. 6. Put your index finger on the top of the medicine canister. Support the bottom of the inhaler and the  spacer with your thumb. 7. Breathe out normally and as completely as possible, away from the spacer. 8. Place the spacer between your teeth and close your lips tightly around it. Keep your tongue down out of the way. 9. Press the canister down with your index finger to release the medicine, then inhale deeply and slowly through your mouth (not your nose) until your lungs are completely filled. Inhaling should take 4-6 seconds. 10. Hold the medicine in your lungs for 5-10 seconds (10 seconds is best). This helps the medicine get into the small airways of your lungs. 11. With your lips in a tight circle (pursed), breathe out slowly. 12. Repeat steps 3-11 until you have taken the number of puffs that your health care provider directed. Wait about 1 minute between puffs or as directed. 13. Remove the spacer from the inhaler and put the cap on the inhaler. 14. If you are using a steroid inhaler, rinse your mouth with water, gargle, and spit out the water. Do not swallow the water. Follow these instructions at home:  Take your inhaled medicine only as told by your health care provider. Do not use the inhaler more than directed by your health care provider.  Keep all follow-up visits as told by your health care provider. This is important.  If your inhaler has a counter, you can check it to determine how full your inhaler is. If your inhaler does not have a counter,  ask your health care provider when you will need to refill your inhaler and write the refill date on a calendar or on your inhaler canister. Note that you cannot know when an inhaler is empty by shaking it.  Follow directions on the package insert for care and cleaning of your inhaler and spacer. Contact a health care provider if:  Symptoms are only partially relieved with your inhaler.  You are having trouble using your inhaler.  You have an increase in phlegm.  You have headaches. Get help right away if:  You feel little or no  relief after using your inhaler.  You have dizziness.  You have a fast heart rate.  You have chills or a fever.  You have night sweats.  There is blood in your phlegm. Summary  A metered dose inhaler is a handheld device for taking medicine that must be breathed into the lungs (inhaled).  The medicine is delivered by pushing down on a metal canister to release a preset amount of spray and medicine.  Each device contains the amount of medicine that is needed for a preset number of uses (inhalations). This information is not intended to replace advice given to you by your health care provider. Make sure you discuss any questions you have with your health care provider. Document Released: 08/04/2005 Document Revised: 06/24/2016 Document Reviewed: 06/24/2016 Elsevier Interactive Patient Education  2017 [employer] Sinus Rinse What is a sinus rinse? A sinus rinse is a simple home treatment that is used to rinse your sinuses with a sterile mixture of salt and water (saline solution). Sinuses are air-filled spaces in your skull behind the bones of your face and forehead that open into your nasal cavity. You will use the following:  Saline solution.  Neti pot or spray bottle. This releases the saline solution into your nose and through your sinuses. Neti pots and spray bottles can be purchased at [employer], a health food store, or online.  When would I do a sinus rinse? A sinus rinse can help to clear mucus, dirt, dust, or pollen from the nasal cavity. You may do a sinus rinse when you have a cold, a virus, nasal allergy symptoms, a sinus infection, or stuffiness in the nose or sinuses. If you are considering a sinus rinse:  Ask your child's health care provider before performing a sinus rinse on your child.  Do not do a sinus rinse if you have had ear or nasal surgery, ear infection, or blocked ears.  How do I do a sinus rinse?  Wash your hands.  Disinfect your device  according to the directions provided and then dry it.  Use the solution that comes with your device or one that is sold separately in stores. Follow the mixing directions on the package.  Fill your device with the amount of saline solution as directed by the device instructions.  Stand over a sink and tilt your head sideways over the sink.  Place the spout of the device in your upper nostril (the one closer to the ceiling).  Gently pour or squeeze the saline solution into the nasal cavity. The liquid should drain to the lower nostril if you are not overly congested.  Gently blow your nose. Blowing too hard may cause ear pain.  Repeat in the other nostril.  Clean and rinse your device with clean water and then air-dry it. Are there risks of a sinus rinse? Sinus rinse is generally very safe and effective. However,  there are a few risks, which include:  A burning sensation in the sinuses. This may happen if you do not make the saline solution as directed. Make sure to follow all directions when making the saline solution.  Infection from contaminated water. This is rare, but possible.  Nasal irritation.  This information is not intended to replace advice given to you by your health care provider. Make sure you discuss any questions you have with your health care provider. Document Released: 03/01/2014 Document Revised: 07/01/2016 Document Reviewed: 12/20/2013 Elsevier Interactive Patient Education  2017 [employer].

## 2018-08-05 ENCOUNTER — Telehealth: Payer: Self-pay | Admitting: Family Medicine

## 2018-08-05 NOTE — Telephone Encounter (Signed)
Electronic refill request. [facility] Thyroid Last office visit:   07/02/18 Acute (knee pain) Last Filled:    90 tablet 0 05/10/2018  Last TSH:  12/17/16   Please advise.

## 2018-08-06 ENCOUNTER — Telehealth: Payer: Self-pay | Admitting: Family Medicine

## 2018-08-06 ENCOUNTER — Encounter: Payer: Self-pay | Admitting: *Deleted

## 2018-08-06 DIAGNOSIS — G629 Polyneuropathy, unspecified: Secondary | ICD-10-CM

## 2018-08-06 DIAGNOSIS — E559 Vitamin D deficiency, unspecified: Secondary | ICD-10-CM

## 2018-08-06 DIAGNOSIS — I10 Essential (primary) hypertension: Secondary | ICD-10-CM

## 2018-08-06 NOTE — Telephone Encounter (Signed)
Letter mailed

## 2018-08-06 NOTE — Telephone Encounter (Signed)
Sent.  Due for CPE when possible.  Thanks.  

## 2018-08-06 NOTE — Telephone Encounter (Signed)
OK, I'll pull the letter out of the mail.

## 2018-08-06 NOTE — Telephone Encounter (Signed)
Pt was scheduled on 09/14/18 for her physical.

## 2018-08-06 NOTE — Telephone Encounter (Signed)
Pt is requesting to get labs done for her Vitamin D when she comes in for her fasting labs on 09/08/18. Please put in order

## 2018-08-08 NOTE — Telephone Encounter (Signed)
Done. Thanks.

## 2018-08-08 NOTE — Addendum Note (Signed)
Addended by: Tonia Ghent on: 08/08/2018 10:17 PM   Modules accepted: Orders

## 2018-08-16 ENCOUNTER — Other Ambulatory Visit: Payer: Self-pay | Admitting: Family Medicine

## 2018-09-07 ENCOUNTER — Encounter: Payer: Self-pay | Admitting: Family Medicine

## 2018-09-08 ENCOUNTER — Other Ambulatory Visit (INDEPENDENT_AMBULATORY_CARE_PROVIDER_SITE_OTHER): Payer: PRIVATE HEALTH INSURANCE

## 2018-09-08 DIAGNOSIS — Z131 Encounter for screening for diabetes mellitus: Secondary | ICD-10-CM | POA: Diagnosis not present

## 2018-09-08 DIAGNOSIS — E559 Vitamin D deficiency, unspecified: Secondary | ICD-10-CM

## 2018-09-08 DIAGNOSIS — I10 Essential (primary) hypertension: Secondary | ICD-10-CM

## 2018-09-08 DIAGNOSIS — G629 Polyneuropathy, unspecified: Secondary | ICD-10-CM | POA: Diagnosis not present

## 2018-09-08 LAB — LIPID PANEL
Cholesterol: 158 mg/dL (ref 0–200)
HDL: 40.9 mg/dL (ref >39.00)
LDL Cholesterol: 96 mg/dL (ref 0–99)
NonHDL: 117.16
Total CHOL/HDL Ratio: 4
Triglycerides: 108 mg/dL (ref 0.0–149.0)
VLDL: 21.6 mg/dL (ref 0.0–40.0)

## 2018-09-08 LAB — VITAMIN D 25 HYDROXY (VIT D DEFICIENCY, FRACTURES): VITD: 42.85 ng/mL (ref 30.00–100.00)

## 2018-09-08 LAB — COMPREHENSIVE METABOLIC PANEL
ALT: 18 U/L (ref 0–35)
AST: 17 U/L (ref 0–37)
Albumin: 4.3 g/dL (ref 3.5–5.2)
Alkaline Phosphatase: 60 U/L (ref 39–117)
BUN: 13 mg/dL (ref 6–23)
CALCIUM: 9.5 mg/dL (ref 8.4–10.5)
CO2: 25 mEq/L (ref 19–32)
Chloride: 103 mEq/L (ref 96–112)
Creatinine, Ser: 0.68 mg/dL (ref 0.40–1.20)
GFR: 89.56 mL/min (ref >60.00)
Glucose, Bld: 102 mg/dL — ABNORMAL HIGH (ref 70–99)
Potassium: 3.6 mEq/L (ref 3.5–5.1)
Sodium: 139 mEq/L (ref 135–145)
Total Bilirubin: 0.4 mg/dL (ref 0.2–1.2)
Total Protein: 7.3 g/dL (ref 6.0–8.3)

## 2018-09-08 LAB — HEMOGLOBIN A1C: Hgb A1c MFr Bld: 5.8 % (ref 4.6–6.5)

## 2018-09-08 LAB — TSH: TSH: 2.35 u[IU]/mL (ref 0.35–4.50)

## 2018-09-09 LAB — HEPATITIS C ANTIBODY
Hepatitis C Ab: NONREACTIVE
SIGNAL TO CUT-OFF: 0.02 (ref <1.00)

## 2018-09-09 LAB — VITAMIN B12: Vitamin B-12: 610 pg/mL (ref 211–911)

## 2018-09-14 ENCOUNTER — Ambulatory Visit (INDEPENDENT_AMBULATORY_CARE_PROVIDER_SITE_OTHER): Payer: PRIVATE HEALTH INSURANCE | Admitting: Family Medicine

## 2018-09-14 ENCOUNTER — Encounter: Payer: Self-pay | Admitting: Family Medicine

## 2018-09-14 ENCOUNTER — Other Ambulatory Visit: Payer: Self-pay | Admitting: Family Medicine

## 2018-09-14 VITALS — BP 120/78 | HR 86 | Temp 98.4°F | Ht 67.0 in | Wt 282.0 lb

## 2018-09-14 DIAGNOSIS — E785 Hyperlipidemia, unspecified: Secondary | ICD-10-CM

## 2018-09-14 DIAGNOSIS — Z0000 Encounter for general adult medical examination without abnormal findings: Secondary | ICD-10-CM | POA: Diagnosis not present

## 2018-09-14 DIAGNOSIS — R059 Cough, unspecified: Secondary | ICD-10-CM

## 2018-09-14 DIAGNOSIS — R05 Cough: Secondary | ICD-10-CM

## 2018-09-14 DIAGNOSIS — E038 Other specified hypothyroidism: Secondary | ICD-10-CM

## 2018-09-14 DIAGNOSIS — Z7189 Other specified counseling: Secondary | ICD-10-CM

## 2018-09-14 DIAGNOSIS — G629 Polyneuropathy, unspecified: Secondary | ICD-10-CM

## 2018-09-14 DIAGNOSIS — I10 Essential (primary) hypertension: Secondary | ICD-10-CM

## 2018-09-14 MED ORDER — GEMFIBROZIL 600 MG PO TABS: 600.0000 mg | ORAL_TABLET | Freq: Two times a day (BID) | ORAL | 3 refills | Status: DC

## 2018-09-14 MED ORDER — ESTROGENS CONJUGATED 0.3 MG PO TABS: 0.3000 mg | ORAL_TABLET | Freq: Every day | ORAL | 3 refills | Status: DC

## 2018-09-14 MED ORDER — HYDROCHLOROTHIAZIDE 12.5 MG PO CAPS: ORAL_CAPSULE | ORAL | 3 refills | Status: DC

## 2018-09-14 MED ORDER — OMEPRAZOLE 40 MG PO CPDR: 40.0000 mg | DELAYED_RELEASE_CAPSULE | Freq: Every day | ORAL | 3 refills | Status: DC

## 2018-09-14 MED ORDER — PRESERVISION AREDS PO TABS: 1.0000 | ORAL_TABLET | Freq: Every day | ORAL | Status: DC

## 2018-09-14 NOTE — Telephone Encounter (Signed)
Refill done with dose change at OV.  Prev d/w pt.

## 2018-09-14 NOTE — Telephone Encounter (Signed)
Electronic refill request. Premarin Last office visit:   09/14/2018 Last Filled:    90 tablet 3 09/11/2017  Please advise.

## 2018-09-14 NOTE — Patient Instructions (Addendum)
Let me know if you need a podiatry referral.   Estrogen: Alternate 0.45 and 0.3 tabs for now. Then continue 0.3mg  tabs.   Update me as needed.   Take care.  Glad to see you.

## 2018-09-14 NOTE — Progress Notes (Signed)
CPE- See plan.  Routine anticipatory guidance given to patient.  See health maintenance.  The possibility exists that previously documented standard health maintenance information may have been brought forward from a previous encounter into this note.  If needed, that same information has been updated to reflect the current situation based on today's encounter.    Tetanus 2018 Flu 2019 PNA not due Shingles d/w pt.   Pap not due.  S/p hysterectomy.   Mammogram 2019 DXA not due.   Diet and exercise d/w pt.  She is working on both.   Living will d/w pt.  Would have her husband designated if patient were incapacitated.    HRT. She prev cut down on dose last year.  No hot flashes.  We talked about gradual taper of her dose.  Hypothyroidism.  No ADE on med.  No neck mass or dysphagia.  Complaint. Labs d/w pt.    Elevated Cholesterol: Using medications without problems:yes Muscle aches: no Diet compliance: encouraged.   Exercise: encouraged  Hypertension:    Using medication without problems or lightheadedness: yes Chest pain with exertion:no Edema:no Short of breath:no  Foot pain/neuropathy is some better with gabapentin.  She was asking about podiatry f/u.  See avs.    Cough worse off symbicort. Still on singulair.  Rare use of SABA.  Seems to be better with symbicort.  She can tolerate as is and will continue as is.  She can update me as needed.     PMH and SH reviewed  Meds, vitals, and allergies reviewed.   ROS: Per HPI.  Unless specifically indicated otherwise in HPI, the patient denies:  General: fever. Eyes: acute vision changes ENT: sore throat Cardiovascular: chest pain Respiratory: SOB GI: vomiting GU: dysuria Musculoskeletal: acute back pain Derm: acute rash Neuro: acute motor dysfunction Psych: worsening mood Endocrine: polydipsia Heme: bleeding Allergy: hayfever  GEN: nad, alert and oriented HEENT: mucous membranes moist NECK: supple w/o LA CV:  rrr. PULM: ctab, no inc wob ABD: soft, +bs EXT: no edema SKIN: no acute rash

## 2018-09-15 NOTE — Assessment & Plan Note (Signed)
Cough worse off symbicort. Still on singulair.  Rare use of SABA.  Seems to be better with symbicort.  She can tolerate as is and will continue as is.  She can update me as needed.

## 2018-09-15 NOTE — Assessment & Plan Note (Signed)
No change in meds.  Continue work on diet and exercise.  Labs discussed with patient.  She agrees. 

## 2018-09-15 NOTE — Assessment & Plan Note (Signed)
Living will d/w pt. Would have her husband designated if patient were incapacitated. 

## 2018-09-15 NOTE — Assessment & Plan Note (Signed)
Foot pain/neuropathy is some better with gabapentin.  She was asking about podiatry f/u.  See avs.

## 2018-09-15 NOTE — Assessment & Plan Note (Signed)
TSH normal.  No thyromegaly on exam.  Continue as is.  She agrees.

## 2018-09-15 NOTE — Assessment & Plan Note (Signed)
Tetanus 2018 Flu 2019 PNA not due Shingles d/w pt.   Pap not due.  S/p hysterectomy.   Mammogram 2019 DXA not due.   Diet and exercise d/w pt.  She is working on both.   Living will d/w pt.  Would have her husband designated if patient were incapacitated.

## 2018-10-08 ENCOUNTER — Other Ambulatory Visit: Payer: Self-pay | Admitting: Family Medicine

## 2018-10-08 NOTE — Telephone Encounter (Signed)
Electronic refill request. Fioricet Last office visit:   09/14/18 CPE Last Filled:    30 tablet 0 12/17/2015  Please advise.

## 2018-10-10 MED ORDER — BUTALBITAL-APAP-CAFFEINE 50-325-40 MG PO TABS: 1.0000 | ORAL_TABLET | ORAL | 0 refills | Status: DC | PRN

## 2018-10-10 NOTE — Telephone Encounter (Signed)
Sent. Thanks.   

## 2018-10-11 ENCOUNTER — Other Ambulatory Visit: Payer: Self-pay | Admitting: Family Medicine

## 2018-10-12 ENCOUNTER — Encounter: Payer: Self-pay | Admitting: Registered Nurse

## 2018-10-12 ENCOUNTER — Ambulatory Visit: Payer: Self-pay | Admitting: Registered Nurse

## 2018-10-12 VITALS — BP 110/70 | HR 78 | Temp 98.0°F

## 2018-10-12 DIAGNOSIS — H6982 Other specified disorders of Eustachian tube, left ear: Secondary | ICD-10-CM

## 2018-10-12 DIAGNOSIS — J0101 Acute recurrent maxillary sinusitis: Secondary | ICD-10-CM

## 2018-10-12 DIAGNOSIS — R059 Cough, unspecified: Secondary | ICD-10-CM

## 2018-10-12 DIAGNOSIS — R05 Cough: Secondary | ICD-10-CM

## 2018-10-12 MED ORDER — BENZONATATE 200 MG PO CAPS: 200.0000 mg | ORAL_CAPSULE | Freq: Three times a day (TID) | ORAL | 1 refills | Status: AC | PRN

## 2018-10-12 MED ORDER — FLUTICASONE PROPIONATE 50 MCG/ACT NA SUSP: 1.0000 | Freq: Two times a day (BID) | NASAL | 3 refills | Status: DC

## 2018-10-12 MED ORDER — PREDNISONE 10 MG PO TABS: 20.0000 mg | ORAL_TABLET | Freq: Every day | ORAL | 0 refills | Status: AC

## 2018-10-12 MED ORDER — ALBUTEROL SULFATE HFA 108 (90 BASE) MCG/ACT IN AERS: 1.0000 | INHALATION_SPRAY | RESPIRATORY_TRACT | 0 refills | Status: DC | PRN

## 2018-10-12 MED ORDER — PHENYLEPHRINE HCL 10 MG PO TABS: 10.0000 mg | ORAL_TABLET | Freq: Four times a day (QID) | ORAL | Status: AC | PRN

## 2018-10-12 NOTE — Progress Notes (Signed)
Subjective:    Patient ID: [name], female    DOB: Jan 10, 1961, 58 y.o.   MRN: [id number]  57y/o Caucasian established female pt c/o L maxillary sinus pain/pressure, L upper dental pain. L otalgia with constant dull pain, muffled sounds, chronic dry cough. At night, she feels like L ear is draining. No new meds for sx in addition to usual daily meds. Has seen pulmonology for chronic cough taking singulair, symbicort, zyrtec and flonase daily.  Prn albuterol needs refill.  Tessalon pearles help but she ran out.  + sick contacts at work     Review of Systems  Constitutional: Positive for fatigue. Negative for activity change, appetite change, chills, diaphoresis, fever and unexpected weight change.  HENT: Positive for congestion, ear discharge, ear pain, postnasal drip, sinus pressure and sinus pain. Negative for dental problem, drooling, facial swelling, hearing loss, mouth sores, nosebleeds, rhinorrhea, sneezing, sore throat, tinnitus, trouble swallowing and voice change.   Eyes: Negative for photophobia, pain, discharge, redness, itching and visual disturbance.  Respiratory: Positive for cough. Negative for choking, chest tightness, shortness of breath, wheezing and stridor.   Cardiovascular: Negative for chest pain, palpitations and leg swelling.  Gastrointestinal: Negative for abdominal distention, abdominal pain, blood in stool, constipation, diarrhea, nausea and vomiting.  Endocrine: Negative for cold intolerance and heat intolerance.  Genitourinary: Negative for difficulty urinating, dysuria and hematuria.  Musculoskeletal: Negative for arthralgias, back pain, gait problem, joint swelling, myalgias, neck pain and neck stiffness.  Skin: Negative for color change, pallor, rash and wound.  Allergic/Immunologic: Positive for environmental allergies. Negative for food allergies.  Neurological: Negative for dizziness, tremors, seizures, syncope, facial asymmetry, speech difficulty,  weakness, light-headedness, numbness and headaches.  Hematological: Negative for adenopathy. Does not bruise/bleed easily.  Psychiatric/Behavioral: Negative for agitation, behavioral problems, confusion and sleep disturbance.       Objective:   Physical Exam Vitals signs and nursing note reviewed.  Constitutional:      General: She is awake. She is not in acute distress.    Appearance: Normal appearance. She is well-developed and well-groomed. She is ill-appearing. She is not toxic-appearing or diaphoretic.  HENT:     Head: Normocephalic and atraumatic.     Jaw: There is normal jaw occlusion. No trismus.     Salivary Glands: Right salivary gland is not diffusely enlarged or tender. Left salivary gland is not diffusely enlarged or tender.     Right Ear: Hearing, ear canal and external ear normal. A middle ear effusion is present.     Left Ear: Hearing, ear canal and external ear normal. A middle ear effusion is present.     Nose: Mucosal edema and rhinorrhea present. No nasal deformity, septal deviation or laceration.     Right Turbinates: Enlarged and swollen.     Left Turbinates: Enlarged and swollen. Not pale.     Right Sinus: No maxillary sinus tenderness or frontal sinus tenderness.     Left Sinus: Maxillary sinus tenderness present. No frontal sinus tenderness.     Mouth/Throat:     Lips: Pink. No lesions.     Mouth: Mucous membranes are moist. Mucous membranes are not pale, not dry and not cyanotic. No lacerations or oral lesions.     Dentition: Normal dentition. Does not have dentures. No dental caries or dental abscesses.     Tongue: No lesions.     Pharynx: Uvula midline. Pharyngeal swelling and posterior oropharyngeal erythema present. No oropharyngeal exudate or uvula swelling.  Tonsils: No tonsillar exudate or tonsillar abscesses.  Eyes:     General: Lids are normal. Allergic shiner present. No visual field deficit or scleral icterus.       Right eye: No foreign body,  discharge or hordeolum.        Left eye: No foreign body, discharge or hordeolum.     Extraocular Movements:     Right eye: Normal extraocular motion and no nystagmus.     Left eye: Normal extraocular motion and no nystagmus.     Conjunctiva/sclera: Conjunctivae normal.     Right eye: Right conjunctiva is not injected. No chemosis, exudate or hemorrhage.    Left eye: Left conjunctiva is not injected. No chemosis, exudate or hemorrhage.    Pupils: Pupils are equal, round, and reactive to light. Pupils are equal.     Right eye: Pupil is round and reactive.     Left eye: Pupil is round and reactive.  Neck:     Musculoskeletal: Normal range of motion and neck supple. Normal range of motion. No edema, erythema, neck rigidity, spinous process tenderness or muscular tenderness.     Thyroid: No thyroid mass or thyromegaly.     Trachea: Trachea and phonation normal. No tracheal tenderness or tracheal deviation.  Cardiovascular:     Rate and Rhythm: Normal rate and regular rhythm.     Chest Wall: PMI is not displaced.     Heart sounds: Normal heart sounds, S1 normal and S2 normal. No murmur. No friction rub. No gallop.   Pulmonary:     Effort: Pulmonary effort is normal. No accessory muscle usage or respiratory distress.     Breath sounds: Normal breath sounds and air entry. No stridor, decreased air movement or transmitted upper airway sounds. No decreased breath sounds, wheezing, rhonchi or rales.  Chest:     Chest wall: No tenderness.  Abdominal:     General: There is no distension.     Palpations: Abdomen is soft.  Musculoskeletal: Normal range of motion.        General: No tenderness.     Right shoulder: Normal.     Left shoulder: Normal.     Right elbow: Normal.    Left elbow: Normal.     Right hip: Normal.     Left hip: Normal.     Right knee: Normal.     Left knee: Normal.     Cervical back: Normal.     Thoracic back: Normal.     Lumbar back: Normal.     Right hand: Normal.      Left hand: Normal.  Lymphadenopathy:     Head:     Right side of head: No submental, submandibular, tonsillar, preauricular, posterior auricular or occipital adenopathy.     Left side of head: No submental, submandibular, tonsillar, preauricular, posterior auricular or occipital adenopathy.     Cervical: No cervical adenopathy.     Right cervical: No superficial, deep or posterior cervical adenopathy.    Left cervical: No superficial, deep or posterior cervical adenopathy.  Skin:    General: Skin is warm and dry.     Capillary Refill: Capillary refill takes less than 2 seconds.     Coloration: Skin is not ashen, cyanotic, jaundiced, mottled, pale or sallow.     Findings: No abrasion, abscess, acne, bruising, burn, ecchymosis, erythema, laceration, lesion, petechiae, rash or wound.     Nails: There is no clubbing.   Neurological:     Mental Status: She is  alert and oriented to person, place, and time. She is not disoriented.     GCS: GCS eye subscore is 4. GCS verbal subscore is 5. GCS motor subscore is 6.     Cranial Nerves: Cranial nerves are intact. No cranial nerve deficit, dysarthria or facial asymmetry.     Sensory: Sensation is intact. No sensory deficit.     Motor: Motor function is intact. No weakness, tremor, atrophy, abnormal muscle tone or seizure activity.     Coordination: Coordination is intact. Coordination normal.     Gait: Gait is intact. Gait normal.     Comments: Gait sure and steady hallway; on/off exam table and in/out of chair without difficulty  Psychiatric:        Speech: Speech normal.        Behavior: Behavior normal. Behavior is cooperative.        Thought Content: Thought content normal.        Judgment: Judgment normal.     Frequent nonproductive cough in exam room; spoke full sentences without difficulty; left maxillary sinus TTP; cobblestoning posterior pharynx; [place] IV airway; bilateral TMs air fluid level clear; bilateral allergic shiners;  bilateral nasal turbinates edema/erythema clear discharge and patient frequently wiping nose with tissue     Assessment & Plan:  A-cough chronic, eustachian tube dysfunction left; acute recurrent maxillary sinusitis  P-refilled tessalon pearles 200mg  po TID prn cough #90 RF1 last Rx from pulmonology expires Mar 2020  Refilled albuterol 86mcg 1-2p po q4-6h prn cough/wheeze #1 RF0  Continue symbicort, singulair, zyrtec, nasal saline.  Add phenylephrine 10mg  po q6h prn rhinitis and may increase zyrtec to 10mg  po BID during flare OTC.  Prednisone 10mg  sig t2 po qam with breakfast x 10 days #21 RF0 dispensed from [facility] to patient.  Shower BID  Hydrate.  Patient may use normal saline nasal spray 2 sprays each nostril q2h wa as needed. flonase 54mcg 1 spray each nostril BID #1 RF0.  Patient denied personal or family history of ENT cancer.  OTC antihistamine of choice zyrtec 10mg  po daily/BID prn since spring bloom started.  Avoid triggers if possible.  Shower prior to bedtime if exposed to triggers.  If allergic dust/dust mites recommend mattress/pillow covers/encasements; washing linens, vacuuming, sweeping, dusting weekly.  Call or return to clinic as needed if these symptoms worsen or fail to improve as anticipated.   Exitcare handout on allergic rhinitis, sinusitis and sinus rinse given.  Patient verbalized understanding of instructions, agreed with plan of care and had no further questions at this time.  P2:  Avoidance and hand washing.  Restart flonase 1 spray each nostril BID, saline 2 sprays each nostril q2h wa prn congestion. Dispensed from [facility] prednisone 10mg  sig take 2 tabs po daily with breakfast x10 days #21 RF0.  If no improvement with 48 hours of saline and flonase and prednisone please notify me.  Denied personal or family history of ENT cancer.  Shower BID especially prior to bed. No evidence of systemic bacterial infection, non toxic and well hydrated.  I do not see where any further testing or  imaging is necessary at this time.   I will suggest supportive care, rest, good hygiene and encourage the patient to take adequate fluids.  The patient is to return to clinic or EMERGENCY ROOM if symptoms worsen or change significantly.  Exitcare handout on sinusitis and sinus rinse.  Patient verbalized agreement and understanding of treatment plan and had no further questions at this time.  P2:  Hand washing and cover cough  Supportive treatment.   No evidence of invasive bacterial infection, non toxic and well hydrated.  This is most likely self limiting viral infection.  I do not see where any further testing or imaging is necessary at this time.   I will suggest supportive care, rest, good hygiene and encourage the patient to take adequate fluids.  The patient is to return to clinic or EMERGENCY ROOM if symptoms worsen or change significantly e.g. ear pain, fever, purulent discharge from ears or bleeding.  Exitcare handout on eustachian tube dysfunction.  Patient verbalized agreement and understanding of treatment plan.     Cough lozenges po q2h prn cough  Prednisone 10mg  sig take 2 tabs po daily with breakfast #21 RF0 dispensed from [facility].  Discussed possible side effects increased/decreased appetite, difficulty sleeping, increased blood sugar, increased blood pressure and heart rate.  Albuterol MDI 47mcg 1-2 puffs po q4-6h prn protracted cough/wheeze #1 RF0 side effect increased heart rate. Bronchitis simple, community acquired, may have started as viral (probably respiratory syncytial, parainfluenza, influenza, or adenovirus), but now evidence of acute purulent bronchitis with resultant bronchial edema and mucus formation.  Viruses are the most common cause of bronchial inflammation in otherwise healthy adults with acute bronchitis.  The appearance of sputum is not predictive of whether a bacterial infection is present.  Purulent sputum is most often caused by viral infections.  There are a small portion  of those caused by non-viral agents being Mycoplama pneumonia.  Microscopic examination or C&S of sputum in the healthy adult with acute bronchitis is generally not helpful (usually negative or normal respiratory flora) other considerations being cough from upper respiratory tract infections, sinusitis or allergic syndromes (mild asthma or viral pneumonia).  Differential Diagnoses:  reactive airway disease (asthma, allergic aspergillosis (eosinophilia), chronic bronchitis, respiratory infection (sinusitis, common cold, pneumonia), congestive heart failure, reflux esophagitis, bronchogenic tumor, aspiration syndromes and/or exposure to pulmonary irritants/smoke.  Without high fever, severe dyspnea, lack of physical findings or other risk factors, I will hold on a chest radiograph and CBC at this time.  I discussed that approximately 50% of patients with acute bronchitis have a cough that lasts up to three weeks, and 25% for over a month.  Tylenol 500mg  one to two tablets every four to six hours as needed for fever or myalgias.  No aspirin. Exitcare handout on cough and inhaler use .  ER if hemopthysis, SOB, worst chest pain of life.   Patient instructed to follow up in one week or sooner if symptoms worsen.  Patient verbalized agreement and understanding of treatment plan.  P2:  hand washing and cover cough

## 2018-10-12 NOTE — Patient Instructions (Signed)
Eustachian Tube Dysfunction  Eustachian tube dysfunction refers to a condition in which a blockage develops in the narrow passage that connects the middle ear to the back of the nose (eustachian tube). The eustachian tube regulates air pressure in the middle ear by letting air move between the ear and nose. It also helps to drain fluid from the middle ear space. Eustachian tube dysfunction can affect one or both ears. When the eustachian tube does not function properly, air pressure, fluid, or both can build up in the middle ear. What are the causes? This condition occurs when the eustachian tube becomes blocked or cannot open normally. Common causes of this condition include:  Ear infections.  Colds and other infections that affect the nose, mouth, and throat (upper respiratory tract).  Allergies.  Irritation from cigarette smoke.  Irritation from stomach acid coming up into the esophagus (gastroesophageal reflux). The esophagus is the tube that carries food from the mouth to the stomach.  Sudden changes in air pressure, such as from descending in an airplane or scuba diving.  Abnormal growths in the nose or throat, such as: ? Growths that line the nose (nasal polyps). ? Abnormal growth of cells (tumors). ? Enlarged tissue at the back of the throat (adenoids). What increases the risk? You are more likely to develop this condition if:  You smoke.  You are overweight.  You are a child who has: ? Certain birth defects of the mouth, such as cleft palate. ? Large tonsils or adenoids. What are the signs or symptoms? Common symptoms of this condition include:  A feeling of fullness in the ear.  Ear pain.  Clicking or popping noises in the ear.  Ringing in the ear.  Hearing loss.  Loss of balance.  Dizziness. Symptoms may get worse when the air pressure around you changes, such as when you travel to an area of high elevation, fly on an airplane, or go scuba diving. How is  this diagnosed? This condition may be diagnosed based on:  Your symptoms.  A physical exam of your ears, nose, and throat.  Tests, such as those that measure: ? The movement of your eardrum (tympanogram). ? Your hearing (audiometry). How is this treated? Treatment depends on the cause and severity of your condition.  In mild cases, you may relieve your symptoms by moving air into your ears. This is called "popping the ears."  In more severe cases, or if you have symptoms of fluid in your ears, treatment may include: ? Medicines to relieve congestion (decongestants). ? Medicines that treat allergies (antihistamines). ? Nasal sprays or ear drops that contain medicines that reduce swelling (steroids). ? A procedure to drain the fluid in your eardrum (myringotomy). In this procedure, a small tube is placed in the eardrum to:  Drain the fluid.  Restore the air in the middle ear space. ? A procedure to insert a balloon device through the nose to inflate the opening of the eustachian tube (balloon dilation). Follow these instructions at home: Lifestyle  Do not do any of the following until your health care provider approves: ? Travel to high altitudes. ? Fly in airplanes. ? Work in a pressurized cabin or room. ? Scuba dive.  Do not use any products that contain nicotine or tobacco, such as cigarettes and e-cigarettes. If you need help quitting, ask your health care provider.  Keep your ears dry. Wear fitted earplugs during showering and bathing. Dry your ears completely after. General instructions  Take over-the-counter   and prescription medicines only as told by your health care provider.  Use techniques to help pop your ears as recommended by your health care provider. These may include: ? Chewing gum. ? Yawning. ? Frequent, forceful swallowing. ? Closing your mouth, holding your nose closed, and gently blowing as if you are trying to blow air out of your nose.  Keep all  follow-up visits as told by your health care provider. This is important. Contact a health care provider if:  Your symptoms do not go away after treatment.  Your symptoms come back after treatment.  You are unable to pop your ears.  You have: ? A fever. ? Pain in your ear. ? Pain in your head or neck. ? Fluid draining from your ear.  Your hearing suddenly changes.  You become very dizzy.  You lose your balance. Summary  Eustachian tube dysfunction refers to a condition in which a blockage develops in the eustachian tube.  It can be caused by ear infections, allergies, inhaled irritants, or abnormal growths in the nose or throat.  Symptoms include ear pain, hearing loss, or ringing in the ears.  Mild cases are treated with maneuvers to unblock the ears, such as yawning or ear popping.  Severe cases are treated with medicines. Surgery may also be done (rare). This information is not intended to replace advice given to you by your health care provider. Make sure you discuss any questions you have with your health care provider. Document Released: 08/31/2015 Document Revised: 11/24/2017 Document Reviewed: 11/24/2017 Elsevier Interactive Patient Education  2019 [employer] Sinusitis, Adult Sinusitis is inflammation of your sinuses. Sinuses are hollow spaces in the bones around your face. Your sinuses are located:  Around your eyes.  In the middle of your forehead.  Behind your nose.  In your cheekbones. Mucus normally drains out of your sinuses. When your nasal tissues become inflamed or swollen, mucus can become trapped or blocked. This allows bacteria, viruses, and fungi to grow, which leads to infection. Most infections of the sinuses are caused by a virus. Sinusitis can develop quickly. It can last for up to 4 weeks (acute) or for more than 12 weeks (chronic). Sinusitis often develops after a cold. What are the causes? This condition is caused by anything that creates  swelling in the sinuses or stops mucus from draining. This includes:  Allergies.  Asthma.  Infection from bacteria or viruses.  Deformities or blockages in your nose or sinuses.  Abnormal growths in the nose (nasal polyps).  Pollutants, such as chemicals or irritants in the air.  Infection from fungi (rare). What increases the risk? You are more likely to develop this condition if you:  Have a weak body defense system (immune system).  Do a lot of swimming or diving.  Overuse nasal sprays.  Smoke. What are the signs or symptoms? The main symptoms of this condition are pain and a feeling of pressure around the affected sinuses. Other symptoms include:  Stuffy nose or congestion.  Thick drainage from your nose.  Swelling and warmth over the affected sinuses.  Headache.  Upper toothache.  A cough that may get worse at night.  Extra mucus that collects in the throat or the back of the nose (postnasal drip).  Decreased sense of smell and taste.  Fatigue.  A fever.  Sore throat.  Bad breath. How is this diagnosed? This condition is diagnosed based on:  Your symptoms.  Your medical history.  A physical exam.  Tests  to find out if your condition is acute or chronic. This may include: ? Checking your nose for nasal polyps. ? Viewing your sinuses using a device that has a light (endoscope). ? Testing for allergies or bacteria. ? Imaging tests, such as an MRI or CT scan. In rare cases, a bone biopsy may be done to rule out more serious types of fungal sinus disease. How is this treated? Treatment for sinusitis depends on the cause and whether your condition is chronic or acute.  If caused by a virus, your symptoms should go away on their own within 10 days. You may be given medicines to relieve symptoms. They include: ? Medicines that shrink swollen nasal passages (topical intranasal decongestants). ? Medicines that treat allergies (antihistamines). ? A  spray that eases inflammation of the nostrils (topical intranasal corticosteroids). ? Rinses that help get rid of thick mucus in your nose (nasal saline washes).  If caused by bacteria, your health care provider may recommend waiting to see if your symptoms improve. Most bacterial infections will get better without antibiotic medicine. You may be given antibiotics if you have: ? A severe infection. ? A weak immune system.  If caused by narrow nasal passages or nasal polyps, you may need to have surgery. Follow these instructions at home: Medicines  Take, use, or apply over-the-counter and prescription medicines only as told by your health care provider. These may include nasal sprays.  If you were prescribed an antibiotic medicine, take it as told by your health care provider. Do not stop taking the antibiotic even if you start to feel better. Hydrate and humidify   Drink enough fluid to keep your urine pale yellow. Staying hydrated will help to thin your mucus.  Use a cool mist humidifier to keep the humidity level in your home above 50%.  Inhale steam for 10-15 minutes, 3-4 times a day, or as told by your health care provider. You can do this in the bathroom while a hot shower is running.  Limit your exposure to cool or dry air. Rest  Rest as much as possible.  Sleep with your head raised (elevated).  Make sure you get enough sleep each night. General instructions   Apply a warm, moist washcloth to your face 3-4 times a day or as told by your health care provider. This will help with discomfort.  Wash your hands often with soap and water to reduce your exposure to germs. If soap and water are not available, use hand sanitizer.  Do not smoke. Avoid being around people who are smoking (secondhand smoke).  Keep all follow-up visits as told by your health care provider. This is important. Contact a health care provider if:  You have a fever.  Your symptoms get  worse.  Your symptoms do not improve within 10 days. Get help right away if:  You have a severe headache.  You have persistent vomiting.  You have severe pain or swelling around your face or eyes.  You have vision problems.  You develop confusion.  Your neck is stiff.  You have trouble breathing. Summary  Sinusitis is soreness and inflammation of your sinuses. Sinuses are hollow spaces in the bones around your face.  This condition is caused by nasal tissues that become inflamed or swollen. The swelling traps or blocks the flow of mucus. This allows bacteria, viruses, and fungi to grow, which leads to infection.  If you were prescribed an antibiotic medicine, take it as told by your health  care provider. Do not stop taking the antibiotic even if you start to feel better.  Keep all follow-up visits as told by your health care provider. This is important. This information is not intended to replace advice given to you by your health care provider. Make sure you discuss any questions you have with your health care provider. Document Released: 08/04/2005 Document Revised: 01/04/2018 Document Reviewed: 01/04/2018 Elsevier Interactive Patient Education  2019 [employer]. How to Perform a Sinus Rinse A sinus rinse is a home treatment that is used to rinse your sinuses with a sterile mixture of salt and water (saline solution). Sinuses are air-filled spaces in your skull behind the bones of your face and forehead that open into your nasal cavity. A sinus rinse can help to clear mucus, dirt, dust, or pollen from your nasal cavity. You may do a sinus rinse when you have a cold, a virus, nasal allergy symptoms, a sinus infection, or stuffiness in your nose or sinuses. Talk with your health care provider about whether a sinus rinse might help you. What are the risks? A sinus rinse is generally safe and effective. However, there are a few risks, which include:  A burning sensation in your  sinuses. This may happen if you do not make the saline solution as directed. Be sure to follow all directions when making the saline solution.  Nasal irritation.  Infection from contaminated water. This is rare, but possible. Do not do a sinus rinse if you have had ear or nasal surgery, ear infection, or blocked ears. Supplies needed:  Saline solution or powder.  Distilled or sterile water may be needed to mix with saline powder. ? You may use boiled and cooled tap water. Boil tap water for 5 minutes; cool until it is lukewarm. Use within 24 hours. ? Do not use regular tap water to mix with the saline solution.  Neti pot or nasal rinse bottle. These supplies release the saline solution into your nose and through your sinuses. Neti pots and nasal rinse bottles can be purchased at [employer], a health food store, or online. How to perform a sinus rinse  1. Wash your hands with soap and water. 2. Wash your device according to the directions that came with the product and then dry it. 3. Use the solution that comes with your product or one that is sold separately in stores. Follow the mixing directions on the package if you need to mix with sterile or distilled water. 4. Fill the device with the amount of saline solution noted in the device instructions. 5. Stand over a sink and tilt your head sideways over the sink. 6. Place the spout of the device in your upper nostril (the one closer to the ceiling). 7. Gently pour or squeeze the saline solution into your nasal cavity. The liquid should drain out from the lower nostril if you are not too congested. 8. While rinsing, breathe through your open mouth. 9. Gently blow your nose to clear any mucus and rinse solution. Blowing too hard may cause ear pain. 10. Repeat in your other nostril. 11. Clean and rinse your device with clean water and then air-dry it. Talk with your health care provider or pharmacist if you have questions about how  to do a sinus rinse. Summary  A sinus rinse is a home treatment that is used to rinse your sinuses with a sterile mixture of salt and water (saline solution).  A sinus rinse is generally safe  and effective. Follow all instructions carefully.  Before doing a sinus rinse, talk with your health care provider about whether it would be helpful for you. This information is not intended to replace advice given to you by your health care provider. Make sure you discuss any questions you have with your health care provider. Document Released: 03/01/2014 Document Revised: 06/01/2017 Document Reviewed: 06/01/2017 Elsevier Interactive Patient Education  2019 [employer] Allergic Rhinitis, Adult Allergic rhinitis is an allergic reaction that affects the mucous membrane inside the nose. It causes sneezing, a runny or stuffy nose, and the feeling of mucus going down the back of the throat (postnasal drip). Allergic rhinitis can be mild to severe. There are two types of allergic rhinitis:  Seasonal. This type is also called hay fever. It happens only during certain seasons.  Perennial. This type can happen at any time of the year. What are the causes? This condition happens when the body's defense system (immune system) responds to certain harmless substances called allergens as though they were germs.  Seasonal allergic rhinitis is triggered by pollen, which can come from grasses, trees, and weeds. Perennial allergic rhinitis may be caused by:  House dust mites.  Pet dander.  Mold spores. What are the signs or symptoms? Symptoms of this condition include:  Sneezing.  Runny or stuffy nose (nasal congestion).  Postnasal drip.  Itchy nose.  Tearing of the eyes.  Trouble sleeping.  Daytime sleepiness. How is this diagnosed? This condition may be diagnosed based on:  Your medical history.  A physical exam.  Tests to check for related conditions, such as: ? Asthma. ? Pink  eye. ? Ear infection. ? Upper respiratory infection.  Tests to find out which allergens trigger your symptoms. These may include skin or blood tests. How is this treated? There is no cure for this condition, but treatment can help control symptoms. Treatment may include:  Taking medicines that block allergy symptoms, such as antihistamines. Medicine may be given as a shot, nasal spray, or pill.  Avoiding the allergen.  Desensitization. This treatment involves getting ongoing shots until your body becomes less sensitive to the allergen. This treatment may be done if other treatments do not help.  If taking medicine and avoiding the allergen does not work, new, stronger medicines may be prescribed. Follow these instructions at home:  Find out what you are allergic to. Common allergens include smoke, dust, and pollen.  Avoid the things you are allergic to. These are some things you can do to help avoid allergens: ? Replace carpet with wood, tile, or vinyl flooring. Carpet can trap dander and dust. ? Do not smoke. Do not allow smoking in your home. ? Change your heating and air conditioning filter at least once a month. ? During allergy season:  Keep windows closed as much as possible.  Plan outdoor activities when pollen counts are lowest. This is usually during the evening hours.  When coming indoors, change clothing and shower before sitting on furniture or bedding.  Take over-the-counter and prescription medicines only as told by your health care provider.  Keep all follow-up visits as told by your health care provider. This is important. Contact a health care provider if:  You have a fever.  You develop a persistent cough.  You make whistling sounds when you breathe (you wheeze).  Your symptoms interfere with your normal daily activities. Get help right away if:  You have shortness of breath. Summary  This condition can be managed by  taking medicines as directed and  avoiding allergens.  Contact your health care provider if you develop a persistent cough or fever.  During allergy season, keep windows closed as much as possible. This information is not intended to replace advice given to you by your health care provider. Make sure you discuss any questions you have with your health care provider. Document Released: 04/29/2001 Document Revised: 09/11/2016 Document Reviewed: 09/11/2016 Elsevier Interactive Patient Education  2019 [employer]. Nonallergic Rhinitis Nonallergic rhinitis is a condition that causes symptoms that affect the nose, such as a runny nose and a stuffed-up nose (nasal congestion) that can make it hard to breathe through the nose. This condition is different from having an allergy (allergic rhinitis). Allergic rhinitis occurs when the body's defense system (immune system) reacts to a substance that you are allergic to (allergen), such as pollen, pet dander, mold, or dust. Nonallergic rhinitis has many similar symptoms, but it is not caused by allergens. Nonallergic rhinitis can be a short-term or long-term problem. What are the causes? This condition can be caused by many different things. Some common types of nonallergic rhinitis include: Infectious rhinitis  This is usually due to an infection in the upper respiratory tract. Vasomotor rhinitis  This is the most common type of long-term nonallergic rhinitis.  It is caused by too much blood flow through the nose, which makes the tissue inside of the nose swell.  Symptoms are often triggered by strong odors, cold air, stress, drinking alcohol, cigarette smoke, or changes in the weather. Occupational rhinitis  This type is caused by triggers in the workplace, such as chemicals, dusts, animal dander, or air pollution. Hormonal rhinitis  This type occurs in women as a result of an increase in the female hormone estrogen.  It may occur during pregnancy, puberty, and menstrual  cycles.  Symptoms improve when estrogen levels drop. Drug-induced rhinitis Several drugs can cause nonallergic rhinitis, including:  Medicines that are used to treat high blood pressure, heart disease, and Parkinson disease.  Aspirin and NSAIDs.  Over-the-counter nasal decongestant sprays. These can cause a type of nonallergic rhinitis (rhinitis medicamentosa) when they are used for more than a few days. Nonallergic rhinitis with eosinophilia syndrome (NARES)  This type is caused by having too much of a certain type of white blood cell (eosinophil). Nonallergic rhinitis can also be caused by a reaction to eating hot or spicy foods. This does not usually cause long-term symptoms. In some cases, the cause of nonallergic rhinitis is not known. What increases the risk? You are more likely to develop this condition if:  You are 64-26 years of age.  You are a woman. Women are twice as likely to have this condition. What are the signs or symptoms? Common symptoms of this condition include:  Nasal congestion.  Runny nose.  The feeling of mucus going down the back of the throat (postnasal drip).  Trouble sleeping at night and daytime sleepiness. Less common symptoms include:  Sneezing.  Coughing.  Itchy nose.  Bloodshot eyes. How is this diagnosed? This condition may be diagnosed based on:  Your symptoms and medical history.  A physical exam.  Allergy testing to rule out allergic rhinitis. You may have skin tests or blood tests. In some cases, the health care provider may take a swab of nasal secretions to look for an increased number of eosinophils. This would be done to confirm a diagnosis of NARES. How is this treated? Treatment for this condition depends on the cause.  No single treatment works for everyone. Work with your health care provider to find the best treatment for you. Treatment may include:  Avoiding the things that trigger your symptoms.  Using medicines to  relieve congestion, such as: ? Steroid nasal spray. There are many types. You may need to try a few to find out which one works best. ? Decongestant medicine. This may be an oral medicine or a nasal spray. These medicines are only used for a short time.  Using medicines to relieve a runny nose. These may include antihistamine medicines or anticholinergic nasal sprays.  Surgery to remove tissue from inside the nose may be needed in severe cases if the condition has not improved after 6-12 months of medical treatment. Follow these instructions at home:  Take or use over-the-counter and prescription medicines only as told by your health care provider. Do not stop using your medicine even if you start to feel better.  Use salt-water (saline) rinses or other solutions (nasal washes or irrigations) to wash or rinse out the inside of your nose as told by your health care provider.  Do not take NSAIDs or medicines that contain aspirin if they make your symptoms worse.  Do not drink alcohol if it makes your symptoms worse.  Do not use any tobacco products, such as cigarettes, chewing tobacco, and e-cigarettes. If you need help quitting, ask your health care provider.  Avoid secondhand smoke.  Get some exercise every day. Exercise may help reduce symptoms of nonallergic rhinitis for some people. Ask your health care provider how much exercise and what types of exercise are safe for you.  Sleep with the head of your bed raised (elevated). This may reduce nighttime nasal congestion.  Keep all follow-up visits as told by your health care provider. This is important. Contact a health care provider if:  You have a fever.  Your symptoms are getting worse at home.  Your symptoms are not responding to medicine.  You develop new symptoms, especially a headache or nosebleed. This information is not intended to replace advice given to you by your health care provider. Make sure you discuss any  questions you have with your health care provider. Document Released: 11/26/2015 Document Revised: 01/10/2016 Document Reviewed: 10/25/2015 Elsevier Interactive Patient Education  2019 [employer] Cough, Adult  Coughing is a reflex that clears your throat and your airways. Coughing helps to heal and protect your lungs. It is normal to cough occasionally, but a cough that happens with other symptoms or lasts a long time may be a sign of a condition that needs treatment. A cough may last only 2-3 weeks (acute), or it may last longer than 8 weeks (chronic). What are the causes? Coughing is commonly caused by:  Breathing in substances that irritate your lungs.  A viral or bacterial respiratory infection.  Allergies.  Asthma.  Postnasal drip.  Smoking.  Acid backing up from the stomach into the esophagus (gastroesophageal reflux).  Certain medicines.  Chronic lung problems, including COPD (or rarely, lung cancer).  Other medical conditions such as heart failure. Follow these instructions at home: Pay attention to any changes in your symptoms. Take these actions to help with your discomfort:  Take medicines only as told by your health care provider. ? If you were prescribed an antibiotic medicine, take it as told by your health care provider. Do not stop taking the antibiotic even if you start to feel better. ? Talk with your health care provider before you take  a cough suppressant medicine.  Drink enough fluid to keep your urine clear or pale yellow.  If the air is dry, use a cold steam vaporizer or humidifier in your bedroom or your home to help loosen secretions.  Avoid anything that causes you to cough at work or at home.  If your cough is worse at night, try sleeping in a semi-upright position.  Avoid cigarette smoke. If you smoke, quit smoking. If you need help quitting, ask your health care provider.  Avoid caffeine.  Avoid alcohol.  Rest as needed. Contact a  health care provider if:  You have new symptoms.  You cough up pus.  Your cough does not get better after 2-3 weeks, or your cough gets worse.  You cannot control your cough with suppressant medicines and you are losing sleep.  You develop pain that is getting worse or pain that is not controlled with pain medicines.  You have a fever.  You have unexplained weight loss.  You have night sweats. Get help right away if:  You cough up blood.  You have difficulty breathing.  Your heartbeat is very fast. This information is not intended to replace advice given to you by your health care provider. Make sure you discuss any questions you have with your health care provider. Document Released: 01/31/2011 Document Revised: 01/10/2016 Document Reviewed: 10/11/2014 Elsevier Interactive Patient Education  2019 [employer]. How to Use a Metered Dose Inhaler A metered dose inhaler is a handheld device for taking medicine that must be breathed into the lungs (inhaled). The device can be used to deliver a variety of inhaled medicines, including:  Quick relief or rescue medicines, such as bronchodilators.  Controller medicines, such as corticosteroids. The medicine is delivered by pushing down on a metal canister to release a preset amount of spray and medicine. Each device contains the amount of medicine that is needed for a preset number of uses (inhalations). Your health care provider may recommend that you use a spacer with your inhaler to help you take the medicine more effectively. A spacer is a plastic tube with a mouthpiece on one end and an opening that connects to the inhaler on the other end. A spacer holds the medicine in a tube for a short time, which allows you to inhale more medicine. What are the risks? If you do not use your inhaler correctly, medicine might not reach your lungs to help you breathe. Inhaler medicine can cause side effects, such as:  Mouth or throat  infection.  Cough.  Hoarseness.  Headache.  Nausea and vomiting.  Lung infection (pneumonia) in people who have a lung condition called COPD. How to use a metered dose inhaler without a spacer  12. Remove the cap from the inhaler. 13. If you are using the inhaler for the first time, shake it for 5 seconds, turn it away from your face, then release 4 puffs into the air. This is called priming. 14. Shake the inhaler for 5 seconds. 15. Position the inhaler so the top of the canister faces up. 16. Put your index finger on the top of the medicine canister. Support the bottom of the inhaler with your thumb. 17. Breathe out normally and as completely as possible, away from the inhaler. 18. Either place the inhaler between your teeth and close your lips tightly around the mouthpiece, or hold the inhaler 1-2 inches (2.5-5 cm) away from your open mouth. Keep your tongue down out of the way. If you are  unsure which technique to use, ask your health care provider. 19. Press the canister down with your index finger to release the medicine, then inhale deeply and slowly through your mouth (not your nose) until your lungs are completely filled. Inhaling should take 4-6 seconds. 20. Hold the medicine in your lungs for 5-10 seconds (10 seconds is best). This helps the medicine get into the small airways of your lungs. 21. With your lips in a tight circle (pursed), breathe out slowly. 22. Repeat steps 3-10 until you have taken the number of puffs that your health care provider directed. Wait about 1 minute between puffs or as directed. 23. Put the cap on the inhaler. 24. If you are using a steroid inhaler, rinse your mouth with water, gargle, and spit out the water. Do not swallow the water. How to use a metered dose inhaler with a spacer  1. Remove the cap from the inhaler. 2. If you are using the inhaler for the first time, shake it for 5 seconds, turn it away from your face, then release 4 puffs into  the air. This is called priming. 3. Shake the inhaler for 5 seconds. 4. Place the open end of the spacer onto the inhaler mouthpiece. 5. Position the inhaler so the top of the canister faces up and the spacer mouthpiece faces you. 6. Put your index finger on the top of the medicine canister. Support the bottom of the inhaler and the spacer with your thumb. 7. Breathe out normally and as completely as possible, away from the spacer. 8. Place the spacer between your teeth and close your lips tightly around it. Keep your tongue down out of the way. 9. Press the canister down with your index finger to release the medicine, then inhale deeply and slowly through your mouth (not your nose) until your lungs are completely filled. Inhaling should take 4-6 seconds. 10. Hold the medicine in your lungs for 5-10 seconds (10 seconds is best). This helps the medicine get into the small airways of your lungs. 11. With your lips in a tight circle (pursed), breathe out slowly. 12. Repeat steps 3-11 until you have taken the number of puffs that your health care provider directed. Wait about 1 minute between puffs or as directed. 13. Remove the spacer from the inhaler and put the cap on the inhaler. 14. If you are using a steroid inhaler, rinse your mouth with water, gargle, and spit out the water. Do not swallow the water. Follow these instructions at home:  Take your inhaled medicine only as told by your health care provider. Do not use the inhaler more than directed by your health care provider.  Keep all follow-up visits as told by your health care provider. This is important.  If your inhaler has a counter, you can check it to determine how full your inhaler is. If your inhaler does not have a counter, ask your health care provider when you will need to refill your inhaler and write the refill date on a calendar or on your inhaler canister. Note that you cannot know when an inhaler is empty by shaking  it.  Follow directions on the package insert for care and cleaning of your inhaler and spacer. Contact a health care provider if:  Symptoms are only partially relieved with your inhaler.  You are having trouble using your inhaler.  You have an increase in phlegm.  You have headaches. Get help right away if:  You feel little or no  relief after using your inhaler.  You have dizziness.  You have a fast heart rate.  You have chills or a fever.  You have night sweats.  There is blood in your phlegm. Summary  A metered dose inhaler is a handheld device for taking medicine that must be breathed into the lungs (inhaled).  The medicine is delivered by pushing down on a metal canister to release a preset amount of spray and medicine.  Each device contains the amount of medicine that is needed for a preset number of uses (inhalations). This information is not intended to replace advice given to you by your health care provider. Make sure you discuss any questions you have with your health care provider. Document Released: 08/04/2005 Document Revised: 02/23/2017 Document Reviewed: 06/24/2016 Elsevier Interactive Patient Education  2019 [employer].

## 2018-11-11 ENCOUNTER — Other Ambulatory Visit: Payer: Self-pay | Admitting: Family Medicine

## 2018-11-11 NOTE — Telephone Encounter (Signed)
Electronic refill request. Gabapentin Last office visit:   09/14/2018 CPE Last Filled:   09/11/2017    #360    3 RF Please advise.

## 2018-11-12 ENCOUNTER — Encounter: Payer: Self-pay | Admitting: Family Medicine

## 2018-11-12 NOTE — Telephone Encounter (Signed)
Sent. Thanks.   

## 2018-12-02 ENCOUNTER — Encounter: Payer: Self-pay | Admitting: Registered Nurse

## 2018-12-02 ENCOUNTER — Telehealth: Payer: Self-pay | Admitting: Registered Nurse

## 2018-12-02 MED ORDER — PREDNISONE 10 MG PO TABS: ORAL_TABLET | ORAL | 0 refills | Status: AC

## 2018-12-02 MED ORDER — SALINE SPRAY 0.65 % NA SOLN: 2.0000 | NASAL | 0 refills | Status: DC

## 2018-12-02 MED ORDER — DOXYCYCLINE HYCLATE 100 MG PO TABS: 100.0000 mg | ORAL_TABLET | Freq: Two times a day (BID) | ORAL | 0 refills | Status: DC

## 2018-12-02 NOTE — Telephone Encounter (Signed)
Patient contacted clinic she is working from home and has noted sinus infections x 1 week tried flonase 1 spray each nostril BID/saline nasal, singulair bedtime, zyrtec am and advil cold and sinus OTC but is not getting better.  Green discharge nasal and teeth "killing her and starting to make her ears hurt"  She would like to start prednisone last used February 20mg  po qam with breakfast x 10 days.  Nonproductive cough unless post nasal drip from sinuses.  Using her albuterol prn and symbicort qam.  Pharmacy preference [facility]  Last antibiotic use Dec 2019 doxycycline 100mg  po BID.  Patient pushing po fluids and has been sheltering in place at home due to covid 19 pandemic denied travel or known exposures.  May continue advil cold and sinus po prn per manufacturer instructions.  Discussed with patient I would send in Rx for doxycycline 100mg  po BID x 10 days #20 RF0 and prednisone 20mg  po qam with breakfast x 10 days then 10mg  po qam x 2 days #22 RF0.  She has tessalon pearles 200mg  po TID prn cough; albuterol MDI 1-2puffs po q4-6h prn protracted cough/wheeze/chest tightness at home.  Follow up if worsening symptoms with 48 hours of antibiotic and prednisone use  Asked if patient required refills on any medications and she stated no.  "I have two bottles of flonase still at home"  Patient verbalized understanding information/instructions, agreed with plan of care and had no further questions at this time.

## 2018-12-03 ENCOUNTER — Other Ambulatory Visit: Payer: Self-pay | Admitting: Pulmonary Disease

## 2018-12-17 ENCOUNTER — Other Ambulatory Visit: Payer: Self-pay | Admitting: Pulmonary Disease

## 2018-12-28 ENCOUNTER — Telehealth: Payer: Self-pay | Admitting: Registered Nurse

## 2018-12-28 ENCOUNTER — Encounter: Payer: Self-pay | Admitting: Registered Nurse

## 2018-12-28 MED ORDER — PREDNISONE 10 MG PO TABS: ORAL_TABLET | ORAL | 0 refills | Status: AC

## 2018-12-28 MED ORDER — AMOXICILLIN-POT CLAVULANATE 875-125 MG PO TABS: 1.0000 | ORAL_TABLET | Freq: Two times a day (BID) | ORAL | 0 refills | Status: AC

## 2018-12-28 NOTE — Telephone Encounter (Signed)
Received email "I just wanted to ask if you have any more suggestions on how I can get rid of this cold/sinus mess I have? I am taking Advil/cold & Sinus daily - using my nose sprays - doing the saline spray daily - and I took all the antibiotic and Prednisone you called in to [facility] for me.  It got better for maybe 4 days but it is back again - all up in my head - blowing out yellow/green. And my ears are stopped up and my cough is a little worst as well.  Was the medication I took strong enough? Please advise what else I can do to get rid of this please.  Home number [phone] if you need to call me.  Thanks for all your help.  [name]"  Patient last use augmentin Feb 2019.  Has received two rounds doxycycline 100mg  po BID in past 6 months along with prednisone taper, phenylephrine, tessalon pearles, flonase, nasal saline.  Per up to date high dose augmentin 875mg  po BID x 10 days recommended first line.  Continue nasal saline, nasal steroid, antihistamine and singulair.  Do not sleep with windows open.  Shower prior to bed.  Consider antihistamine nasal spray.  Message left for patient regarding these items and awaiting clarification of her preferred pharmacy and if agreeable to plan of care.  Patient replied while I was documenting encounter she does want to try augmentin and prednisone 20mg  po qam x 9 days then 10mg  qam x 2 days and 5mg  po qam x 2 days with food #21 RF0 taper again.  RN Hildred Alamin stated she can drop off at patient home for no contact delivery and to decrease risk of covid exposure for patient.  Patient agreed for no contact delivery to her home from [employer].  Patient is to follow up for re-evaluation if worsening despite plan of care.  Patient verbalized understanding information/instructions, agreed with plan of care and had no further questions at this time.

## 2018-12-29 ENCOUNTER — Other Ambulatory Visit: Payer: Self-pay | Admitting: Pulmonary Disease

## 2019-01-29 ENCOUNTER — Other Ambulatory Visit: Payer: Self-pay | Admitting: Family Medicine

## 2019-03-02 ENCOUNTER — Other Ambulatory Visit: Payer: Self-pay | Admitting: Pulmonary Disease

## 2019-03-04 NOTE — Progress Notes (Signed)
Cardiology Office Note  Date:  03/08/2019   ID:  [name], [name] June 10, 1961, MRN [id number]  PCP:  Tonia Ghent, MD   Chief Complaint  Patient presents with  . office visit    NEW-c/o persistent cough, chest pain, and BLLE edema    HPI:  [name] is a 58 year old woman with history of Morbid obesity Chronic cough, 4 years  Heart catheterization 2002 with Dr. Humphrey Rolls For abn ekg, tried nuclear stress, ) Who presents by referral from Dr. Damita Dunnings for consultation of her cough and leg edema  Notes indicating cough is worse without Symbicort, better after double dose still taking Singulair Uses albuterol  No prior CT scan chest  Cough in day, napping all day, not at night broncospastic cough  Burning feet at night, on neurontin  Once in a while some sputum No ABX before  EKG personally reviewed by myself on todays visit Shows normal sinus rhythm with no significant ST-T wave changes, rate 79 bpm  Records requested from 2002 and received, reviewed, normal coronary arteries   PMH:   has a past medical history of Anxiety, History of colon polyps, History of eosinophilia, Hyperlipidemia, Hypothyroidism, Migraine, Neuropathy, Nocturia, Pelvic prolapse, Seasonal allergies, Urgency of urination, Vitamin D deficiency, and Wears glasses.  PSH:    Past Surgical History:  Procedure Laterality Date  . ABDOMINAL HYSTERECTOMY  2001   w/  Bladder Suspension  . ANTERIOR AND POSTERIOR REPAIR WITH SACROSPINOUS FIXATION N/A 07/24/2014   Procedure: SACROSPINOUS LIGAMENT SUSPENSION ;  Surgeon: Maisie Fus, MD;  Location: [facility];  Service: Gynecology;  Laterality: N/A;  . COLONOSCOPY W/ POLYPECTOMY  04-25-2014  . LAPAROSCOPIC CHOLECYSTECTOMY  2008  . VAGINAL HYSTERECTOMY N/A 07/24/2014   Procedure: RESECTION OF CERVIX ;  Surgeon: Maisie Fus, MD;  Location: [facility];  Service: Gynecology;  Laterality: N/A;    Current Outpatient  Medications  Medication Sig Dispense Refill  . albuterol (VENTOLIN HFA) 108 (90 Base) MCG/ACT inhaler Inhale 1-2 puffs into the lungs every 4 (four) hours as needed for up to 10 days for wheezing or shortness of breath. 18 each 0  . ARMOUR THYROID 30 MG tablet TAKE ONE TABLET BY MOUTH EVERY MORNING BEFORE BREAKFAST 90 tablet 0  . butalbital-acetaminophen-caffeine (FIORICET) 50-325-40 MG tablet Take 1 tablet by mouth as needed. 30 tablet 0  . cetirizine (ZYRTEC) 10 MG tablet Take 1 tablet (10 mg total) by mouth daily. (Patient taking differently: Take 10 mg by mouth daily as needed. ) 30 tablet 11  . Cholecalciferol (VITAMIN D3) 2000 UNITS TABS Take 6,000 Units by mouth daily. Reported on 10/29/2015    . diclofenac sodium (VOLTAREN) 1 % GEL Apply 2 g topically 4 (four) times daily as needed. 100 g 3  . estrogens, conjugated, (PREMARIN) 0.3 MG tablet Take 1 tablet (0.3 mg total) by mouth daily. 90 tablet 3  . fluticasone (FLONASE) 50 MCG/ACT nasal spray Place 1 spray into both nostrils 2 (two) times daily. (Patient taking differently: Place 1 spray into both nostrils 2 (two) times daily as needed. ) 48 g 3  . gabapentin (NEURONTIN) 100 MG capsule TAKE 4 CAPSULES BY MOUTH AT BEDTIME 360 capsule 2  . gemfibrozil (LOPID) 600 MG tablet Take 1 tablet (600 mg total) by mouth 2 (two) times daily before a meal. 180 tablet 3  . hydrochlorothiazide (MICROZIDE) 12.5 MG capsule TAKE 1 CAPSULE BY MOUTH DAILY AS NEEDED FOR LOWER EXTREMITY EDEMA. 90 capsule 3  .  montelukast (SINGULAIR) 10 MG tablet Take 1 tablet (10 mg total) by mouth at bedtime. 90 tablet 3  . Multiple Vitamin (MULTIVITAMIN) capsule Take 1 capsule by mouth daily.    . Multiple Vitamins-Minerals (PRESERVISION AREDS) TABS Take 1 tablet by mouth daily.    Marland Kitchen omeprazole (PRILOSEC) 40 MG capsule Take 1 capsule (40 mg total) by mouth at bedtime. 90 capsule 3  . sodium chloride (OCEAN) 0.65 % SOLN nasal spray Place 2 sprays into both nostrils every 2 (two)  hours while awake for 30 days. (Patient taking differently: Place 2 sprays into both nostrils as needed. )  0  . SUMAtriptan (IMITREX) 20 MG/ACT nasal spray Place 1 spray (20 mg total) into the nose as needed. 24 Inhaler 0  . SYMBICORT 160-4.5 MCG/ACT inhaler INHALE TWO PUFFS BY MOUTH TWICE A DAY 10.2 g 1  . vitamin B-12 (CYANOCOBALAMIN) 1000 MCG tablet Take 1 tablet (1,000 mcg total) by mouth daily. (Patient taking differently: Take 500 mcg by mouth daily. )    . furosemide (LASIX) 20 MG tablet Take 1 tablet (20 mg total) by mouth daily as needed (As needed for leg swelling). 90 tablet 3   No current facility-administered medications for this visit.      Allergies:   Codeine   Social History:  The patient  reports that she quit smoking about 36 years ago. Her smoking use included cigarettes. She has a 1.00 pack-year smoking history. She has never used smokeless tobacco. She reports that she does not drink alcohol or use drugs.   Family History:   family history includes Hyperlipidemia in her mother.    Review of Systems: Review of Systems  Constitutional: Negative.   HENT: Negative.   Respiratory: Positive for cough.   Cardiovascular: Positive for leg swelling.  Gastrointestinal: Negative.   Musculoskeletal: Negative.   Neurological: Negative.   Psychiatric/Behavioral: Negative.   All other systems reviewed and are negative.   PHYSICAL EXAM: VS:  BP 130/84 (BP Location: Right Arm, Patient Position: Sitting, Cuff Size: Large)   Pulse 79   Temp 98.2 F (36.8 C)   Ht 5\' 7"  (1.702 m)   Wt 276 lb 8 oz (125.4 kg)   SpO2 98%   BMI 43.31 kg/m  , BMI Body mass index is 43.31 kg/m. GEN: Well nourished, well developed, in no acute distress, obese HEENT: normal Neck: no JVD, carotid bruits, or masses Cardiac: RRR; no murmurs, rubs, or gallops,no edema  Respiratory:  clear to auscultation bilaterally, normal work of breathing GI: soft, nontender, nondistended, + BS MS: no deformity  or atrophy Skin: warm and dry, no rash Neuro:  Strength and sensation are intact Psych: euthymic mood, full affect   Recent Labs: 09/08/2018: ALT 18; BUN 13; Creatinine, Ser 0.68; Potassium 3.6; Sodium 139; TSH 2.35    Lipid Panel Lab Results  Component Value Date   CHOL 158 09/08/2018   HDL 40.90 09/08/2018   LDLCALC 96 09/08/2018   TRIG 108.0 09/08/2018      Wt Readings from Last 3 Encounters:  03/08/19 276 lb 8 oz (125.4 kg)  09/14/18 282 lb (127.9 kg)  07/02/18 282 lb (127.9 kg)       ASSESSMENT AND PLAN:  Problem List Items Addressed This Visit      Cardiology Problems   Hyperlipidemia   Relevant Medications   furosemide (LASIX) 20 MG tablet   Other Relevant Orders   EKG 12-Lead   Essential hypertension   Relevant Medications   furosemide (LASIX)  20 MG tablet     Other   Cough - Primary   Relevant Orders   ECHOCARDIOGRAM COMPLETE    Other Visit Diagnoses    Edema, unspecified type       Relevant Orders   ECHOCARDIOGRAM COMPLETE     Chronic cough Etiology unclear, very bronchospastic, loose,  Echocardiogram has been ordered to exclude cardiovascular disease also given her lower extremity edema We will try to estimate right heart pressures -I am concerned about a chronic bronchitis given how loose and the way it sounds.  She reports never having tried antibiotics in the past Also never had chest CT scan These latter things could be considered if echocardiogram unrevealing  Lower extremity edema Recommend she try Lasix sparingly after her echocardiogram possibly once or twice a week We have started over-the-counter potassium daily with her HCTZ and 2 potassiums with Lasix Recommend she try to avoid taking HCTZ and Lasix at the same time  Hypertension Blood pressure is well controlled on today's visit. No changes made to the medications.   Disposition:   F/U as needed   Total encounter time more than 60 minutes  Greater than 50% was spent in  counseling and coordination of care with the patient   Patient was seen in consultation by Dr. Damita Dunnings and will be referred back to his office for ongoing care of the issues detailed above  Signed, Esmond Plants, M.D., Ph.D. [place], [place]

## 2019-03-07 ENCOUNTER — Telehealth: Payer: Self-pay | Admitting: Cardiovascular Disease

## 2019-03-07 NOTE — Telephone Encounter (Signed)

## 2019-03-08 ENCOUNTER — Encounter: Payer: Self-pay | Admitting: Cardiovascular Disease

## 2019-03-08 ENCOUNTER — Other Ambulatory Visit: Payer: Self-pay

## 2019-03-08 ENCOUNTER — Ambulatory Visit: Payer: PRIVATE HEALTH INSURANCE | Admitting: Cardiovascular Disease

## 2019-03-08 VITALS — BP 130/84 | HR 79 | Temp 98.2°F | Ht 67.0 in | Wt 276.5 lb

## 2019-03-08 DIAGNOSIS — R059 Cough, unspecified: Secondary | ICD-10-CM

## 2019-03-08 DIAGNOSIS — R05 Cough: Secondary | ICD-10-CM

## 2019-03-08 DIAGNOSIS — I10 Essential (primary) hypertension: Secondary | ICD-10-CM | POA: Diagnosis not present

## 2019-03-08 DIAGNOSIS — E785 Hyperlipidemia, unspecified: Secondary | ICD-10-CM

## 2019-03-08 DIAGNOSIS — R609 Edema, unspecified: Secondary | ICD-10-CM

## 2019-03-08 MED ORDER — FUROSEMIDE 20 MG PO TABS: 20.0000 mg | ORAL_TABLET | Freq: Every day | ORAL | 3 refills | Status: DC | PRN

## 2019-03-08 NOTE — Patient Instructions (Addendum)
Medication Instructions:  Your physician has recommended you make the following change in your medication:  1. START Furosemide 20 mg once daily as needed for leg swelling. 2. TAKE 1 tablet over the counter potassium daily when you take the furosemide TAKE 2 potassium pills.   If you need a refill on your cardiac medications before your next appointment, please call your pharmacy.    Lab work: No new labs needed   If you have labs (blood work) drawn today and your tests are completely normal, you will receive your results only by: Marland Kitchen MyChart Message (if you have MyChart) OR . A paper copy in the mail If you have any lab test that is abnormal or we need to change your treatment, we will call you to review the results.   Testing/Procedures: Echo for cough, edema Your physician has requested that you have an echocardiogram. Echocardiography is a painless test that uses sound waves to create images of your heart. It provides your doctor with information about the size and shape of your heart and how well your heart's chambers and valves are working. This procedure takes approximately one hour. There are no restrictions for this procedure.     Follow-Up: At [facility], you and your health needs are our priority.  As part of our continuing mission to provide you with exceptional heart care, we have created designated Provider Care Teams.  These Care Teams include your primary Cardiologist (physician) and Advanced Practice Providers (APPs -  Physician Assistants and Nurse Practitioners) who all work together to provide you with the care you need, when you need it.  . You will need a follow up appointment as needed   . Providers on your designated Care Team:   . Murray Hodgkins, NP . Christell Faith, PA-C . Marrianne Mood, PA-  Any Other Special Instructions Will Be Listed Below (If Applicable).  For educational health videos Log in to : [email or web address] Or : [email or web address], password  : [email or web address]

## 2019-03-14 ENCOUNTER — Other Ambulatory Visit: Payer: PRIVATE HEALTH INSURANCE

## 2019-03-16 ENCOUNTER — Other Ambulatory Visit: Payer: Self-pay | Admitting: Cardiovascular Disease

## 2019-03-16 DIAGNOSIS — R609 Edema, unspecified: Secondary | ICD-10-CM

## 2019-03-18 ENCOUNTER — Encounter: Payer: Self-pay | Admitting: Internal Medicine

## 2019-03-30 ENCOUNTER — Other Ambulatory Visit: Payer: Self-pay

## 2019-03-30 ENCOUNTER — Ambulatory Visit (INDEPENDENT_AMBULATORY_CARE_PROVIDER_SITE_OTHER): Payer: PRIVATE HEALTH INSURANCE

## 2019-03-30 ENCOUNTER — Encounter

## 2019-03-30 DIAGNOSIS — R609 Edema, unspecified: Secondary | ICD-10-CM | POA: Diagnosis not present

## 2019-05-01 ENCOUNTER — Other Ambulatory Visit: Payer: Self-pay | Admitting: Family Medicine

## 2019-05-03 ENCOUNTER — Other Ambulatory Visit: Payer: Self-pay | Admitting: *Deleted

## 2019-06-06 ENCOUNTER — Telehealth: Payer: Self-pay | Admitting: Family Medicine

## 2019-06-06 DIAGNOSIS — R928 Other abnormal and inconclusive findings on diagnostic imaging of breast: Secondary | ICD-10-CM

## 2019-06-06 NOTE — Telephone Encounter (Signed)
Patient called.  She had a screening mammogram done at work.  It was a mobile mammogram unit from [place].  Patient received the results and they found something on the mammogram and want her to schedule a diagnostic.  They told patient they sent the results to [facility].  They wanted patient to follow up in [place], but patient lives in [place] and would like to go to [place]. Patient can go anytime.

## 2019-06-07 NOTE — Telephone Encounter (Signed)
I have the report.  Possible focal abnormality in the upper outer quadrant of the left breast. Please scan the report.  Order placed.  Thanks.

## 2019-06-07 NOTE — Telephone Encounter (Signed)
Patient advised and will come by the office to pick up a copy of the report.  Copy left at front desk for pickup.

## 2019-06-08 ENCOUNTER — Telehealth: Payer: Self-pay | Admitting: Family Medicine

## 2019-06-08 DIAGNOSIS — R928 Other abnormal and inconclusive findings on diagnostic imaging of breast: Secondary | ICD-10-CM

## 2019-06-08 NOTE — Telephone Encounter (Signed)
Spoke with [place] and the order for Bilateral Diagnostic MMG needs to be a [id number], Diagnostic Uni Left Tomo MMG because she just had a MMG . Your left breast US order  is correct. Please cancel the Bilateral MMG order.

## 2019-06-08 NOTE — Telephone Encounter (Signed)
Changed.  Thanks.

## 2019-06-13 ENCOUNTER — Other Ambulatory Visit: Payer: Self-pay | Admitting: Family Medicine

## 2019-06-13 ENCOUNTER — Inpatient Hospital Stay
Admission: RE | Admit: 2019-06-13 | Discharge: 2019-06-13 | Disposition: A | Discharge status: Home / Self Care | Payer: Self-pay | Source: Ambulatory Visit | Attending: Family Medicine | Admitting: Family Medicine

## 2019-06-13 DIAGNOSIS — Z1231 Encounter for screening mammogram for malignant neoplasm of breast: Secondary | ICD-10-CM

## 2019-06-21 ENCOUNTER — Ambulatory Visit
Admission: RE | Admit: 2019-06-21 | Discharge: 2019-06-21 | Disposition: A | Discharge status: Home / Self Care | Payer: PRIVATE HEALTH INSURANCE | Source: Ambulatory Visit | Attending: Family Medicine | Admitting: Family Medicine

## 2019-06-21 DIAGNOSIS — R928 Other abnormal and inconclusive findings on diagnostic imaging of breast: Secondary | ICD-10-CM

## 2019-07-21 ENCOUNTER — Telehealth: Payer: Self-pay | Admitting: Registered Nurse

## 2019-07-21 ENCOUNTER — Other Ambulatory Visit: Payer: Self-pay

## 2019-07-21 ENCOUNTER — Encounter: Payer: Self-pay | Admitting: Registered Nurse

## 2019-07-21 VITALS — Temp 96.9°F | Resp 20

## 2019-07-21 DIAGNOSIS — J0101 Acute recurrent maxillary sinusitis: Secondary | ICD-10-CM

## 2019-07-21 MED ORDER — PREDNISONE 10 MG PO TABS: ORAL_TABLET | ORAL | 0 refills | Status: AC

## 2019-07-21 MED ORDER — DOXYCYCLINE HYCLATE 100 MG PO TABS: 100.0000 mg | ORAL_TABLET | Freq: Two times a day (BID) | ORAL | 0 refills | Status: AC

## 2019-07-21 NOTE — Progress Notes (Signed)
Subjective:    Patient ID: [name], female    DOB: 22-Mar-1961, 58 y.o.   MRN: [id number]  58y/o caucasian female established patient with sinus infection symptoms x 5 days advil cold and sinus not working, teeth/nose whole head hurts.  Has typically required oral steroids and antibiotics in the past augmentin or doxycyline Nov 2019 failed augmentin and required doxy May 2020 just augmentin and prednisone per chart review  She is using flonase BID and nasal saline 4-5 times per day along with advil cold and sinus without relief.  Patient agreed to telehealth visit today via telephone and for meds to be mailed to her at home as she cannot come to onsite clinic due to covid precautions she has been assigned to work from home.     Review of Systems  Constitutional: Negative for activity change, appetite change, chills, diaphoresis, fatigue, fever and unexpected weight change.  HENT: Positive for congestion, postnasal drip, sinus pressure and sinus pain. Negative for dental problem, drooling, ear discharge, ear pain, facial swelling, hearing loss, mouth sores, nosebleeds, rhinorrhea, sneezing, sore throat, tinnitus, trouble swallowing and voice change.   Eyes: Negative for photophobia, pain, discharge, redness, itching and visual disturbance.  Respiratory: Positive for cough. Negative for choking, chest tightness, shortness of breath, wheezing and stridor.   Cardiovascular: Negative for chest pain, palpitations and leg swelling.  Gastrointestinal: Negative for abdominal distention, abdominal pain, blood in stool, constipation, diarrhea, nausea and vomiting.  Endocrine: Negative for cold intolerance and heat intolerance.  Genitourinary: Negative for difficulty urinating.  Musculoskeletal: Negative for arthralgias, back pain, gait problem, joint swelling, myalgias, neck pain and neck stiffness.  Skin: Negative for color change, pallor, rash and wound.  Allergic/Immunologic: Positive for  environmental allergies. Negative for food allergies.  Neurological: Positive for headaches. Negative for dizziness, tremors, seizures, syncope, facial asymmetry, speech difficulty, weakness, light-headedness and numbness.  Hematological: Negative for adenopathy. Does not bruise/bleed easily.  Psychiatric/Behavioral: Negative for agitation, behavioral problems, confusion and sleep disturbance.       Objective:   Physical Exam Vitals signs and nursing note reviewed.  Constitutional:      General: She is awake. She is not in acute distress.    Appearance: Normal appearance. She is well-developed and well-groomed. She is obese. She is not ill-appearing, toxic-appearing or diaphoretic.  HENT:     Head: Normocephalic and atraumatic.     Jaw: There is normal jaw occlusion. No trismus.     Salivary Glands: Right salivary gland is not diffusely enlarged or tender. Left salivary gland is not diffusely enlarged or tender.     Right Ear: Hearing and external ear normal.     Left Ear: Hearing and external ear normal.     Nose: Mucosal edema, congestion and rhinorrhea present. No nasal deformity, septal deviation or laceration.     Right Sinus: Maxillary sinus tenderness present. No frontal sinus tenderness.     Left Sinus: Maxillary sinus tenderness present. No frontal sinus tenderness.     Mouth/Throat:     Lips: Pink. No lesions.     Mouth: Mucous membranes are moist. Mucous membranes are not pale, not dry and not cyanotic. No injury, lacerations, oral lesions or angioedema.     Dentition: No gingival swelling or gum lesions.     Tongue: No lesions. Tongue does not deviate from midline.     Palate: No mass and lesions.     Pharynx: Oropharynx is clear. Uvula midline. No uvula swelling.  Comments: Bilateral allergic shiners; nasal sniffing noted and congestion during telephone conversation and in person delivery of meds; maxillary sinuses bilaterally TTP Eyes:     General: Lids are normal.  Vision grossly intact. Gaze aligned appropriately. Allergic shiner present. No visual field deficit or scleral icterus.       Right eye: No foreign body, discharge or hordeolum.        Left eye: No foreign body, discharge or hordeolum.     Extraocular Movements: Extraocular movements intact.     Right eye: Normal extraocular motion and no nystagmus.     Left eye: Normal extraocular motion and no nystagmus.     Conjunctiva/sclera: Conjunctivae normal.     Right eye: Right conjunctiva is not injected. No chemosis, exudate or hemorrhage.    Left eye: Left conjunctiva is not injected. No chemosis, exudate or hemorrhage.    Pupils: Pupils are equal, round, and reactive to light. Pupils are equal.     Right eye: Pupil is round and reactive.     Left eye: Pupil is round and reactive.  Neck:     Musculoskeletal: Normal range of motion. Normal range of motion. No edema, erythema, neck rigidity, crepitus, injury, pain with movement or torticollis.     Trachea: Trachea and phonation normal. No tracheal deviation.  Cardiovascular:     Chest Wall: PMI is not displaced.     Heart sounds: S1 normal and S2 normal.  Pulmonary:     Effort: Pulmonary effort is normal. No accessory muscle usage or respiratory distress.     Breath sounds: Normal breath sounds and air entry. No stridor. No decreased breath sounds, wheezing or rales.     Comments: Intermittent cough noted with walking from house to street; no cough observed on telephone conversation; spoke full sentences without difficulty Abdominal:     General: There is no distension.     Palpations: Abdomen is soft.  Musculoskeletal: Normal range of motion.        General: No deformity or signs of injury.     Right shoulder: Normal.     Left shoulder: Normal.     Right elbow: Normal.    Left elbow: Normal.     Right hip: Normal.     Left hip: Normal.     Right knee: Normal.     Left knee: Normal.     Cervical back: Normal.     Right hand: Normal.      Left hand: Normal.  Lymphadenopathy:     Head:     Right side of head: No submental, submandibular, preauricular or occipital adenopathy.     Left side of head: No submental, submandibular, preauricular or occipital adenopathy.     Cervical: No cervical adenopathy.     Right cervical: No superficial cervical adenopathy.    Left cervical: No superficial cervical adenopathy.  Skin:    General: Skin is warm and dry.     Capillary Refill: Capillary refill takes less than 2 seconds.     Coloration: Skin is not ashen, cyanotic, jaundiced, mottled, pale or sallow.     Findings: No abrasion, abscess, acne, bruising, burn, ecchymosis, erythema, signs of injury, laceration, lesion, petechiae, rash or wound.     Nails: There is no clubbing.   Neurological:     General: No focal deficit present.     Mental Status: She is alert and oriented to person, place, and time. Mental status is at baseline. She is not disoriented.  GCS: GCS eye subscore is 4. GCS verbal subscore is 5. GCS motor subscore is 6.     Cranial Nerves: Cranial nerves are intact. No cranial nerve deficit, dysarthria or facial asymmetry.     Sensory: Sensation is intact. No sensory deficit.     Motor: Motor function is intact. No weakness, tremor, atrophy, abnormal muscle tone or seizure activity.     Coordination: Coordination is intact. Coordination normal.     Gait: Gait is intact. Gait normal.     Comments: Gait sure and steady in street  Psychiatric:        Attention and Perception: Attention and perception normal.        Mood and Affect: Mood and affect normal.        Speech: Speech normal.        Behavior: Behavior normal. Behavior is cooperative.        Thought Content: Thought content normal.        Cognition and Memory: Cognition and memory normal.        Judgment: Judgment normal.     Coworker unable to deliver patient meds and due to mail delivery delays I personally delivered patient medications to her residence  and she met me outside residence      Assessment & Plan:  A-acute recurrent maxillary sinusitis  P-Discussed with patient Nov 2019 failed augmentin for sinusitis but May 2020 augmentin and prednisone worked well.  She preferred doxycycline and prednisone today.  Increase use of nasal saline to q2h wa.  Start prednisone 53m po daily with breakfast x 9 days then 170mx2 days then 99m41m 2 days #21 Rf0 dispensed from [facility] to patient.  flonase 1 spray each nostril BID, saline 2 sprays each nostril q2h wa prn congestion.  If no improvement with 48 hours of prednisone, saline and flonase use start doxycycline 100m14m BID x 10 days #20 RF0 dispensed from [facility] to patient  Reminder wear sun protection clothing/sunscreen as increased risk sunburn with doxycycline and take with food/stomach upset common.  Denied personal or family history of ENT cancer.  Shower BID especially prior to bed. No evidence of systemic bacterial infection, non toxic and well hydrated.  I do not see where any further testing or imaging is necessary at this time.   I will suggest supportive care, rest, good hygiene and encourage the patient to take adequate fluids.  The patient is to return to clinic or EMERGENCY ROOM if symptoms worsen or change significantly.  Exitcare handout on sinusitis and sinus rinse.  Patient refused AVS printout.  Patient verbalized agreement and understanding of treatment plan and had no further questions at this time.   P2:  Hand washing and cover cough

## 2019-08-04 ENCOUNTER — Telehealth: Payer: Self-pay | Admitting: Registered Nurse

## 2019-08-04 ENCOUNTER — Other Ambulatory Visit: Payer: Self-pay

## 2019-08-04 ENCOUNTER — Encounter: Payer: Self-pay | Admitting: Registered Nurse

## 2019-08-04 VITALS — BP 140/72 | HR 81 | Temp 97.8°F

## 2019-08-04 DIAGNOSIS — J0101 Acute recurrent maxillary sinusitis: Secondary | ICD-10-CM

## 2019-08-04 MED ORDER — PREDNISONE 10 MG PO TABS: ORAL_TABLET | ORAL | 0 refills | Status: DC

## 2019-08-04 MED ORDER — AMOXICILLIN-POT CLAVULANATE 875-125 MG PO TABS: 1.0000 | ORAL_TABLET | Freq: Two times a day (BID) | ORAL | 0 refills | Status: AC
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
## 2019-08-04 NOTE — Patient Instructions (Addendum)
Sinusitis, Adult Sinusitis is inflammation of your sinuses. Sinuses are hollow spaces in the bones around your face. Your sinuses are located:  Around your eyes.  In the middle of your forehead.  Behind your nose.  In your cheekbones. Mucus normally drains out of your sinuses. When your nasal tissues become inflamed or swollen, mucus can become trapped or blocked. This allows bacteria, viruses, and fungi to grow, which leads to infection. Most infections of the sinuses are caused by a virus. Sinusitis can develop quickly. It can last for up to 4 weeks (acute) or for more than 12 weeks (chronic). Sinusitis often develops after a cold. What are the causes? This condition is caused by anything that creates swelling in the sinuses or stops mucus from draining. This includes:  Allergies.  Asthma.  Infection from bacteria or viruses.  Deformities or blockages in your nose or sinuses.  Abnormal growths in the nose (nasal polyps).  Pollutants, such as chemicals or irritants in the air.  Infection from fungi (rare). What increases the risk? You are more likely to develop this condition if you:  Have a weak body defense system (immune system).  Do a lot of swimming or diving.  Overuse nasal sprays.  Smoke. What are the signs or symptoms? The main symptoms of this condition are pain and a feeling of pressure around the affected sinuses. Other symptoms include:  Stuffy nose or congestion.  Thick drainage from your nose.  Swelling and warmth over the affected sinuses.  Headache.  Upper toothache.  A cough that may get worse at night.  Extra mucus that collects in the throat or the back of the nose (postnasal drip).  Decreased sense of smell and taste.  Fatigue.  A fever.  Sore throat.  Bad breath. How is this diagnosed? This condition is diagnosed based on:  Your symptoms.  Your medical history.  A physical exam.  Tests to find out if your condition is  acute or chronic. This may include: ? Checking your nose for nasal polyps. ? Viewing your sinuses using a device that has a light (endoscope). ? Testing for allergies or bacteria. ? Imaging tests, such as an MRI or CT scan. In rare cases, a bone biopsy may be done to rule out more serious types of fungal sinus disease. How is this treated? Treatment for sinusitis depends on the cause and whether your condition is chronic or acute.  If caused by a virus, your symptoms should go away on their own within 10 days. You may be given medicines to relieve symptoms. They include: ? Medicines that shrink swollen nasal passages (topical intranasal decongestants). ? Medicines that treat allergies (antihistamines). ? A spray that eases inflammation of the nostrils (topical intranasal corticosteroids). ? Rinses that help get rid of thick mucus in your nose (nasal saline washes).  If caused by bacteria, your health care provider may recommend waiting to see if your symptoms improve. Most bacterial infections will get better without antibiotic medicine. You may be given antibiotics if you have: ? A severe infection. ? A weak immune system.  If caused by narrow nasal passages or nasal polyps, you may need to have surgery. Follow these instructions at home: Medicines  Take, use, or apply over-the-counter and prescription medicines only as told by your health care provider. These may include nasal sprays.  If you were prescribed an antibiotic medicine, take it as told by your health care provider. Do not stop taking the antibiotic even if you start   to feel better. Hydrate and humidify   Drink enough fluid to keep your urine pale yellow. Staying hydrated will help to thin your mucus.  Use a cool mist humidifier to keep the humidity level in your home above 50%.  Inhale steam for 10-15 minutes, 3-4 times a day, or as told by your health care provider. You can do this in the bathroom while a hot shower is  running.  Limit your exposure to cool or dry air. Rest  Rest as much as possible.  Sleep with your head raised (elevated).  Make sure you get enough sleep each night. General instructions   Apply a warm, moist washcloth to your face 3-4 times a day or as told by your health care provider. This will help with discomfort.  Wash your hands often with soap and water to reduce your exposure to germs. If soap and water are not available, use hand sanitizer.  Do not smoke. Avoid being around people who are smoking (secondhand smoke).  Keep all follow-up visits as told by your health care provider. This is important. Contact a health care provider if:  You have a fever.  Your symptoms get worse.  Your symptoms do not improve within 10 days. Get help right away if:  You have a severe headache.  You have persistent vomiting.  You have severe pain or swelling around your face or eyes.  You have vision problems.  You develop confusion.  Your neck is stiff.  You have trouble breathing. Summary  Sinusitis is soreness and inflammation of your sinuses. Sinuses are hollow spaces in the bones around your face.  This condition is caused by nasal tissues that become inflamed or swollen. The swelling traps or blocks the flow of mucus. This allows bacteria, viruses, and fungi to grow, which leads to infection.  If you were prescribed an antibiotic medicine, take it as told by your health care provider. Do not stop taking the antibiotic even if you start to feel better.  Keep all follow-up visits as told by your health care provider. This is important. This information is not intended to replace advice given to you by your health care provider. Make sure you discuss any questions you have with your health care provider. Document Released: 08/04/2005 Document Revised: 01/04/2018 Document Reviewed: 01/04/2018 Elsevier Patient Education  2020 [employer]. How to Perform a Sinus Rinse A  sinus rinse is a home treatment that is used to rinse your sinuses with a sterile mixture of salt and water (saline solution). Sinuses are air-filled spaces in your skull behind the bones of your face and forehead that open into your nasal cavity. A sinus rinse can help to clear mucus, dirt, dust, or pollen from your nasal cavity. You may do a sinus rinse when you have a cold, a virus, nasal allergy symptoms, a sinus infection, or stuffiness in your nose or sinuses. Talk with your health care provider about whether a sinus rinse might help you. What are the risks? A sinus rinse is generally safe and effective. However, there are a few risks, which include:  A burning sensation in your sinuses. This may happen if you do not make the saline solution as directed. Be sure to follow all directions when making the saline solution.  Nasal irritation.  Infection from contaminated water. This is rare, but possible. Do not do a sinus rinse if you have had ear or nasal surgery, ear infection, or blocked ears. Supplies needed:  Saline solution or  powder.  Distilled or sterile water may be needed to mix with saline powder. ? You may use boiled and cooled tap water. Boil tap water for 5 minutes; cool until it is lukewarm. Use within 24 hours. ? Do not use regular tap water to mix with the saline solution.  Neti pot or nasal rinse bottle. These supplies release the saline solution into your nose and through your sinuses. Neti pots and nasal rinse bottles can be purchased at [employer], a health food store, or online. How to perform a sinus rinse  1. Wash your hands with soap and water. 2. Wash your device according to the directions that came with the product and then dry it. 3. Use the solution that comes with your product or one that is sold separately in stores. Follow the mixing directions on the package if you need to mix with sterile or distilled water. 4. Fill the device with the amount of  saline solution noted in the device instructions. 5. Stand over a sink and tilt your head sideways over the sink. 6. Place the spout of the device in your upper nostril (the one closer to the ceiling). 7. Gently pour or squeeze the saline solution into your nasal cavity. The liquid should drain out from the lower nostril if you are not too congested. 8. While rinsing, breathe through your open mouth. 9. Gently blow your nose to clear any mucus and rinse solution. Blowing too hard may cause ear pain. 10. Repeat in your other nostril. 11. Clean and rinse your device with clean water and then air-dry it. Talk with your health care provider or pharmacist if you have questions about how to do a sinus rinse. Summary  A sinus rinse is a home treatment that is used to rinse your sinuses with a sterile mixture of salt and water (saline solution).  A sinus rinse is generally safe and effective. Follow all instructions carefully.  Before doing a sinus rinse, talk with your health care provider about whether it would be helpful for you. This information is not intended to replace advice given to you by your health care provider. Make sure you discuss any questions you have with your health care provider. Document Released: 03/01/2014 Document Revised: 06/01/2017 Document Reviewed: 06/01/2017 Elsevier Patient Education  2020 [employer].  Eustachian Tube Dysfunction  Eustachian tube dysfunction refers to a condition in which a blockage develops in the narrow passage that connects the middle ear to the back of the nose (eustachian tube). The eustachian tube regulates air pressure in the middle ear by letting air move between the ear and nose. It also helps to drain fluid from the middle ear space. Eustachian tube dysfunction can affect one or both ears. When the eustachian tube does not function properly, air pressure, fluid, or both can build up in the middle ear. What are the causes? This condition  occurs when the eustachian tube becomes blocked or cannot open normally. Common causes of this condition include:  Ear infections.  Colds and other infections that affect the nose, mouth, and throat (upper respiratory tract).  Allergies.  Irritation from cigarette smoke.  Irritation from stomach acid coming up into the esophagus (gastroesophageal reflux). The esophagus is the tube that carries food from the mouth to the stomach.  Sudden changes in air pressure, such as from descending in an airplane or scuba diving.  Abnormal growths in the nose or throat, such as: ? Growths that line the nose (nasal polyps). ?  Abnormal growth of cells (tumors). ? Enlarged tissue at the back of the throat (adenoids). What increases the risk? You are more likely to develop this condition if:  You smoke.  You are overweight.  You are a child who has: ? Certain birth defects of the mouth, such as cleft palate. ? Large tonsils or adenoids. What are the signs or symptoms? Common symptoms of this condition include:  A feeling of fullness in the ear.  Ear pain.  Clicking or popping noises in the ear.  Ringing in the ear.  Hearing loss.  Loss of balance.  Dizziness. Symptoms may get worse when the air pressure around you changes, such as when you travel to an area of high elevation, fly on an airplane, or go scuba diving. How is this diagnosed? This condition may be diagnosed based on:  Your symptoms.  A physical exam of your ears, nose, and throat.  Tests, such as those that measure: ? The movement of your eardrum (tympanogram). ? Your hearing (audiometry). How is this treated? Treatment depends on the cause and severity of your condition.  In mild cases, you may relieve your symptoms by moving air into your ears. This is called "popping the ears."  In more severe cases, or if you have symptoms of fluid in your ears, treatment may include: ? Medicines to relieve congestion  (decongestants). ? Medicines that treat allergies (antihistamines). ? Nasal sprays or ear drops that contain medicines that reduce swelling (steroids). ? A procedure to drain the fluid in your eardrum (myringotomy). In this procedure, a small tube is placed in the eardrum to:  Drain the fluid.  Restore the air in the middle ear space. ? A procedure to insert a balloon device through the nose to inflate the opening of the eustachian tube (balloon dilation). Follow these instructions at home: Lifestyle  Do not do any of the following until your health care provider approves: ? Travel to high altitudes. ? Fly in airplanes. ? Work in a [employer] manager or room. ? Scuba dive.  Do not use any products that contain nicotine or tobacco, such as cigarettes and e-cigarettes. If you need help quitting, ask your health care provider.  Keep your ears dry. Wear fitted earplugs during showering and bathing. Dry your ears completely after. General instructions  Take over-the-counter and prescription medicines only as told by your health care provider.  Use techniques to help pop your ears as recommended by your health care provider. These may include: ? Chewing gum. ? Yawning. ? Frequent, forceful swallowing. ? Closing your mouth, holding your nose closed, and gently blowing as if you are trying to blow air out of your nose.  Keep all follow-up visits as told by your health care provider. This is important. Contact a health care provider if:  Your symptoms do not go away after treatment.  Your symptoms come back after treatment.  You are unable to pop your ears.  You have: ? A fever. ? Pain in your ear. ? Pain in your head or neck. ? Fluid draining from your ear.  Your hearing suddenly changes.  You become very dizzy.  You lose your balance. Summary  Eustachian tube dysfunction refers to a condition in which a blockage develops in the eustachian tube.  It can be caused by ear  infections, allergies, inhaled irritants, or abnormal growths in the nose or throat.  Symptoms include ear pain, hearing loss, or ringing in the ears.  Mild cases are treated with  maneuvers to unblock the ears, such as yawning or ear popping.  Severe cases are treated with medicines. Surgery may also be done (rare). This information is not intended to replace advice given to you by your health care provider. Make sure you discuss any questions you have with your health care provider. Document Released: 08/31/2015 Document Revised: 11/24/2017 Document Reviewed: 11/24/2017 Elsevier Patient Education  [place].  Otitis Media, Adult  Otitis media occurs when there is inflammation and fluid in the middle ear. Your middle ear is a part of the ear that contains bones for hearing as well as air that helps send sounds to your brain. What are the causes? This condition is caused by a blockage in the eustachian tube. This tube drains fluid from the ear to the back of the nose (nasopharynx). A blockage in this tube can be caused by an object or by swelling (edema) in the tube. Problems that can cause a blockage include:  A cold or other upper respiratory infection.  Allergies.  An irritant, such as tobacco smoke.  Enlarged adenoids. The adenoids are areas of soft tissue located high in the back of the throat, behind the nose and the roof of the mouth.  A mass in the nasopharynx.  Damage to the ear caused by pressure changes (barotrauma). What are the signs or symptoms? Symptoms of this condition include:  Ear pain.  A fever.  Decreased hearing.  A headache.  Tiredness (lethargy).  Fluid leaking from the ear.  Ringing in the ear. How is this diagnosed? This condition is diagnosed with a physical exam. During the exam your health care provider will use an instrument called an otoscope to look into your ear and check for redness, swelling, and fluid. He or she will also ask  about your symptoms. Your health care provider may also order tests, such as:  A test to check the movement of the eardrum (pneumatic otoscopy). This test is done by squeezing a small amount of air into the ear.  A test that changes air pressure in the middle ear to check how well the eardrum moves and whether the eustachian tube is working (tympanogram). How is this treated? This condition usually goes away on its own within 3-5 days. But if the condition is caused by a bacteria infection and does not go away own its own, or keeps coming back, your health care provider may:  Prescribe antibiotic medicines to treat the infection.  Prescribe or recommend medicines to control pain. Follow these instructions at home:  Take over-the-counter and prescription medicines only as told by your health care provider.  If you were prescribed an antibiotic medicine, take it as told by your health care provider. Do not stop taking the antibiotic even if you start to feel better.  Keep all follow-up visits as told by your health care provider. This is important. Contact a health care provider if:  You have bleeding from your nose.  There is a lump on your neck.  You are not getting better in 5 days.  You feel worse instead of better. Get help right away if:  You have severe pain that is not controlled with medicine.  You have swelling, redness, or pain around your ear.  You have stiffness in your neck.  A part of your face is paralyzed.  The bone behind your ear (mastoid) is tender when you touch it.  You develop a severe headache. Summary  Otitis media is redness, soreness, and  swelling of the middle ear.  This condition usually goes away on its own within 3-5 days.  If the problem does not go away in 3-5 days, your health care provider may prescribe or recommend medicines to treat your symptoms.  If you were prescribed an antibiotic medicine, take it as told by your health care  provider. This information is not intended to replace advice given to you by your health care provider. Make sure you discuss any questions you have with your health care provider. Document Released: 05/09/2004 Document Revised: 07/17/2017 Document Reviewed: 07/25/2016 Elsevier Patient Education  2020 [employer].

## 2019-08-07 ENCOUNTER — Other Ambulatory Visit: Payer: Self-pay | Admitting: Registered Nurse

## 2019-08-07 DIAGNOSIS — J302 Other seasonal allergic rhinitis: Secondary | ICD-10-CM

## 2019-08-25 ENCOUNTER — Other Ambulatory Visit: Payer: Self-pay | Admitting: Family Medicine

## 2019-09-04 ENCOUNTER — Other Ambulatory Visit: Payer: Self-pay | Admitting: Family Medicine

## 2019-09-04 DIAGNOSIS — I10 Essential (primary) hypertension: Secondary | ICD-10-CM

## 2019-09-04 DIAGNOSIS — G629 Polyneuropathy, unspecified: Secondary | ICD-10-CM

## 2019-09-04 DIAGNOSIS — E559 Vitamin D deficiency, unspecified: Secondary | ICD-10-CM

## 2019-09-13 ENCOUNTER — Encounter: Payer: Self-pay | Admitting: Family Medicine

## 2019-09-14 ENCOUNTER — Other Ambulatory Visit: Payer: Self-pay | Admitting: Family Medicine

## 2019-09-14 DIAGNOSIS — R739 Hyperglycemia, unspecified: Secondary | ICD-10-CM

## 2019-09-15 ENCOUNTER — Other Ambulatory Visit (INDEPENDENT_AMBULATORY_CARE_PROVIDER_SITE_OTHER): Payer: PRIVATE HEALTH INSURANCE

## 2019-09-15 ENCOUNTER — Other Ambulatory Visit: Payer: Self-pay

## 2019-09-15 DIAGNOSIS — I10 Essential (primary) hypertension: Secondary | ICD-10-CM | POA: Diagnosis not present

## 2019-09-15 DIAGNOSIS — E559 Vitamin D deficiency, unspecified: Secondary | ICD-10-CM

## 2019-09-15 DIAGNOSIS — R739 Hyperglycemia, unspecified: Secondary | ICD-10-CM

## 2019-09-15 DIAGNOSIS — G629 Polyneuropathy, unspecified: Secondary | ICD-10-CM

## 2019-09-15 LAB — CBC WITH DIFFERENTIAL/PLATELET
Basophils Absolute: 0.1 10*3/uL (ref 0.0–0.1)
Basophils Relative: 1 % (ref 0.0–3.0)
Eosinophils Absolute: 0.3 10*3/uL (ref 0.0–0.7)
Eosinophils Relative: 3.4 % (ref 0.0–5.0)
HCT: 38.4 % (ref 36.0–46.0)
Hemoglobin: 12.7 g/dL (ref 12.0–15.0)
Lymphocytes Relative: 26.5 % (ref 12.0–46.0)
Lymphs Abs: 2.2 10*3/uL (ref 0.7–4.0)
MCHC: 33.2 g/dL (ref 30.0–36.0)
MCV: 90.6 fl (ref 78.0–100.0)
Monocytes Absolute: 0.6 10*3/uL (ref 0.1–1.0)
Monocytes Relative: 7.8 % (ref 3.0–12.0)
Neutro Abs: 5 10*3/uL (ref 1.4–7.7)
Neutrophils Relative %: 61.3 % (ref 43.0–77.0)
Platelets: 313 10*3/uL (ref 150.0–400.0)
RBC: 4.23 Mil/uL (ref 3.87–5.11)
RDW: 14.8 % (ref 11.5–15.5)
WBC: 8.2 10*3/uL (ref 4.0–10.5)

## 2019-09-15 LAB — COMPREHENSIVE METABOLIC PANEL
ALT: 25 U/L (ref 0–35)
AST: 19 U/L (ref 0–37)
Albumin: 4.3 g/dL (ref 3.5–5.2)
Alkaline Phosphatase: 69 U/L (ref 39–117)
BUN: 14 mg/dL (ref 6–23)
CO2: 27 mEq/L (ref 19–32)
Calcium: 9.6 mg/dL (ref 8.4–10.5)
Chloride: 102 mEq/L (ref 96–112)
Creatinine, Ser: 0.67 mg/dL (ref 0.40–1.20)
GFR: 90.77 mL/min (ref >60.00)
Glucose, Bld: 111 mg/dL — ABNORMAL HIGH (ref 70–99)
Potassium: 4.1 mEq/L (ref 3.5–5.1)
Sodium: 139 mEq/L (ref 135–145)
Total Bilirubin: 0.4 mg/dL (ref 0.2–1.2)
Total Protein: 6.8 g/dL (ref 6.0–8.3)

## 2019-09-15 LAB — VITAMIN D 25 HYDROXY (VIT D DEFICIENCY, FRACTURES): VITD: 41.5 ng/mL (ref 30.00–100.00)

## 2019-09-15 LAB — LIPID PANEL
Cholesterol: 160 mg/dL (ref 0–200)
HDL: 43.1 mg/dL (ref >39.00)
LDL Cholesterol: 93 mg/dL (ref 0–99)
NonHDL: 116.63
Total CHOL/HDL Ratio: 4
Triglycerides: 116 mg/dL (ref 0.0–149.0)
VLDL: 23.2 mg/dL (ref 0.0–40.0)

## 2019-09-15 LAB — VITAMIN B12: Vitamin B-12: 826 pg/mL (ref 211–911)

## 2019-09-15 LAB — TSH: TSH: 2.19 u[IU]/mL (ref 0.35–4.50)

## 2019-09-15 LAB — HEMOGLOBIN A1C: Hgb A1c MFr Bld: 6 % (ref 4.6–6.5)

## 2019-09-19 ENCOUNTER — Encounter: Payer: Self-pay | Admitting: Family Medicine

## 2019-09-19 ENCOUNTER — Other Ambulatory Visit: Payer: Self-pay

## 2019-09-19 ENCOUNTER — Ambulatory Visit (INDEPENDENT_AMBULATORY_CARE_PROVIDER_SITE_OTHER): Payer: PRIVATE HEALTH INSURANCE | Admitting: Family Medicine

## 2019-09-19 VITALS — BP 136/74 | HR 95 | Temp 96.5°F | Ht 67.0 in | Wt 282.1 lb

## 2019-09-19 DIAGNOSIS — R059 Cough, unspecified: Secondary | ICD-10-CM

## 2019-09-19 DIAGNOSIS — R609 Edema, unspecified: Secondary | ICD-10-CM

## 2019-09-19 DIAGNOSIS — Z7189 Other specified counseling: Secondary | ICD-10-CM

## 2019-09-19 DIAGNOSIS — E785 Hyperlipidemia, unspecified: Secondary | ICD-10-CM

## 2019-09-19 DIAGNOSIS — G629 Polyneuropathy, unspecified: Secondary | ICD-10-CM

## 2019-09-19 DIAGNOSIS — Z0000 Encounter for general adult medical examination without abnormal findings: Secondary | ICD-10-CM | POA: Diagnosis not present

## 2019-09-19 DIAGNOSIS — M25562 Pain in left knee: Secondary | ICD-10-CM

## 2019-09-19 DIAGNOSIS — M549 Dorsalgia, unspecified: Secondary | ICD-10-CM

## 2019-09-19 DIAGNOSIS — E038 Other specified hypothyroidism: Secondary | ICD-10-CM

## 2019-09-19 DIAGNOSIS — R05 Cough: Secondary | ICD-10-CM

## 2019-09-19 DIAGNOSIS — R21 Rash and other nonspecific skin eruption: Secondary | ICD-10-CM

## 2019-09-19 MED ORDER — GABAPENTIN 100 MG PO CAPS: ORAL_CAPSULE | ORAL | Status: DC

## 2019-09-19 MED ORDER — GEMFIBROZIL 600 MG PO TABS: 600.0000 mg | ORAL_TABLET | Freq: Two times a day (BID) | ORAL | 3 refills | Status: DC

## 2019-09-19 MED ORDER — IBUPROFEN 800 MG PO TABS: 800.0000 mg | ORAL_TABLET | ORAL | 3 refills | Status: DC | PRN

## 2019-09-19 MED ORDER — OMEPRAZOLE 40 MG PO CPDR: 40.0000 mg | DELAYED_RELEASE_CAPSULE | Freq: Every day | ORAL | 3 refills | Status: DC

## 2019-09-19 MED ORDER — HALCINONIDE 0.1 % EX CREA: TOPICAL_CREAM | CUTANEOUS | Status: DC

## 2019-09-19 MED ORDER — HYDROCHLOROTHIAZIDE 12.5 MG PO CAPS: ORAL_CAPSULE | ORAL | 3 refills | Status: DC

## 2019-09-19 MED ORDER — POTASSIUM 99 MG PO TABS: 1.0000 | ORAL_TABLET | Freq: Every day | ORAL | Status: AC

## 2019-09-19 NOTE — Patient Instructions (Addendum)
Try going up to 500 then 600mg  of gabapentin and update me as needed.  Update me as needed.  We'll fax in your refills.  Take care.  Glad to see you.

## 2019-09-19 NOTE — Progress Notes (Signed)
This visit occurred during the SARS-CoV-2 public health emergency.  Safety protocols were in place, including screening questions prior to the visit, additional usage of staff PPE, and extensive cleaning of exam room while observing appropriate contact time as indicated for disinfecting solutions.  CPE- See plan.  Routine anticipatory guidance given to patient.  See health maintenance.  The possibility exists that previously documented standard health maintenance information may have been brought forward from a previous encounter into this note.  If needed, that same information has been updated to reflect the current situation based on today's encounter.    Tetanus 2018 Flu 2020 PNA not due Shingles d/w pt.   covid vaccine d/w pt.  Pap not due.  S/p hysterectomy.   Mammogram 2020 DXA not due.   Diet and exercise d/w pt.  She is working on both.   Living will d/w pt. Would have her husband designated if patient were incapacitated.    She has used halog cream with relief on B shin rash over the years.  Doesn't need refill now.  Previously used with relief.  Elevated Cholesterol: Using medications without problems: yes Muscle aches: no Diet compliance: yes Exercise:yes  Labs d/w pt.   Ibuprofen used rarely for her back.  nsaid cautions d/w pt.    voltaren gel helps her knee pain.   Hypothyroidism.  No neck mass.  No dysphagia.  TSH wnl.  Compliant.    She caring for her sister and parents.  Stressors discussed.  covid cautions d/w pt.    She uses HCTZ for mild BLE edema (3x per week), lasix with potassium if more BLE edema (1-2 x per week).  She is trying to elevate her legs.    Symbicort used daily with prn/rare SABA use.  Her cough is better with symbicort and singulair.    She is still having foot pain, gabapentin helped some.  D/w pt about inc from 400 to 600mg  qhs if needed.  No weakness.  PMH and SH reviewed  Meds, vitals, and allergies reviewed.   ROS: Per HPI.  Unless  specifically indicated otherwise in HPI, the patient denies:  General: fever. Eyes: acute vision changes ENT: sore throat Cardiovascular: chest pain Respiratory: SOB GI: vomiting GU: dysuria Musculoskeletal: acute back pain Derm: acute rash Neuro: acute motor dysfunction Psych: worsening mood Endocrine: polydipsia Heme: bleeding Allergy: hayfever  GEN: nad, alert and oriented HEENT: ncat NECK: supple w/o LA, no TMG CV: rrr. PULM: ctab, no inc wob ABD: soft, +bs EXT: no edema SKIN: no acute rash but mild chronic rash on R shins.

## 2019-09-26 DIAGNOSIS — M549 Dorsalgia, unspecified: Secondary | ICD-10-CM | POA: Insufficient documentation

## 2019-09-26 DIAGNOSIS — R609 Edema, unspecified: Secondary | ICD-10-CM | POA: Insufficient documentation

## 2019-09-26 NOTE — Assessment & Plan Note (Signed)
Tetanus 2018 Flu 2020 PNA not due Shingles d/w pt.   covid vaccine d/w pt.  Pap not due.  S/p hysterectomy.   Mammogram 2020 DXA not due.   Diet and exercise d/w pt.  She is working on both.   Living will d/w pt. Would have her husband designated if patient were incapacitated.

## 2019-09-26 NOTE — Assessment & Plan Note (Signed)
  Symbicort used daily with prn/rare SABA use.  Her cough is better with symbicort and singulair.  Continue as is.

## 2019-09-26 NOTE — Assessment & Plan Note (Signed)
No neck mass.  No dysphagia.  TSH wnl.  Compliant.   Continue as is.  She agrees.

## 2019-09-26 NOTE — Assessment & Plan Note (Signed)
Continue Voltaren gel as needed. 

## 2019-09-26 NOTE — Assessment & Plan Note (Signed)
She has used halog cream with relief on B shin rash over the years.  Doesn't need refill now.  Previously used with relief.  Continue as needed use.

## 2019-09-26 NOTE — Assessment & Plan Note (Signed)
She uses HCTZ for mild BLE edema (3x per week), lasix with potassium if more BLE edema (1-2 x per week).  She is trying to elevate her legs.  Continue as is otherwise.

## 2019-09-26 NOTE — Assessment & Plan Note (Signed)
She is still having foot pain, gabapentin helped some.  D/w pt about inc from 400 to 600mg  qhs if needed.  She will update me as needed.

## 2019-09-26 NOTE — Assessment & Plan Note (Signed)
Living will d/w pt. Would have her husband designated if patient were incapacitated. 

## 2019-09-26 NOTE — Assessment & Plan Note (Signed)
Continue as needed ibuprofen use with NSAID caution.

## 2019-09-26 NOTE — Assessment & Plan Note (Signed)
Continue gemfibrozil.  Continue work on diet and exercise. ?

## 2019-11-05 ENCOUNTER — Other Ambulatory Visit: Payer: Self-pay | Admitting: Family Medicine

## 2019-12-08 ENCOUNTER — Encounter: Payer: Self-pay | Admitting: Registered Nurse

## 2019-12-08 ENCOUNTER — Other Ambulatory Visit: Payer: Self-pay

## 2019-12-08 ENCOUNTER — Telehealth: Payer: Self-pay | Admitting: Registered Nurse

## 2019-12-08 DIAGNOSIS — H6692 Otitis media, unspecified, left ear: Secondary | ICD-10-CM

## 2019-12-08 DIAGNOSIS — J0101 Acute recurrent maxillary sinusitis: Secondary | ICD-10-CM

## 2019-12-08 MED ORDER — AMOXICILLIN-POT CLAVULANATE 875-125 MG PO TABS: 1.0000 | ORAL_TABLET | Freq: Two times a day (BID) | ORAL | 0 refills | Status: AC

## 2019-12-08 MED ORDER — SALINE SPRAY 0.65 % NA SOLN: 2.0000 | NASAL | 0 refills | Status: DC

## 2019-12-08 MED ORDER — PREDNISONE 10 MG PO TABS: ORAL_TABLET | ORAL | 0 refills | Status: AC

## 2019-12-08 NOTE — Patient Instructions (Signed)
Allergic Rhinitis, Adult Allergic rhinitis is an allergic reaction that affects the mucous membrane inside the nose. It causes sneezing, a runny or stuffy nose, and the feeling of mucus going down the back of the throat (postnasal drip). Allergic rhinitis can be mild to severe. There are two types of allergic rhinitis:  Seasonal. This type is also called hay fever. It happens only during certain seasons.  Perennial. This type can happen at any time of the year. What are the causes? This condition happens when the body's defense system (immune system) responds to certain harmless substances called allergens as though they were germs.  Seasonal allergic rhinitis is triggered by pollen, which can come from grasses, trees, and weeds. Perennial allergic rhinitis may be caused by:  House dust mites.  Pet dander.  Mold spores. What are the signs or symptoms? Symptoms of this condition include:  Sneezing.  Runny or stuffy nose (nasal congestion).  Postnasal drip.  Itchy nose.  Tearing of the eyes.  Trouble sleeping.  Daytime sleepiness. How is this diagnosed? This condition may be diagnosed based on:  Your medical history.  A physical exam.  Tests to check for related conditions, such as: ? Asthma. ? Pink eye. ? Ear infection. ? Upper respiratory infection.  Tests to find out which allergens trigger your symptoms. These may include skin or blood tests. How is this treated? There is no cure for this condition, but treatment can help control symptoms. Treatment may include:  Taking medicines that block allergy symptoms, such as antihistamines. Medicine may be given as a shot, nasal spray, or pill.  Avoiding the allergen.  Desensitization. This treatment involves getting ongoing shots until your body becomes less sensitive to the allergen. This treatment may be done if other treatments do not help.  If taking medicine and avoiding the allergen does not work, new, stronger  medicines may be prescribed. Follow these instructions at home:  Find out what you are allergic to. Common allergens include smoke, dust, and pollen.  Avoid the things you are allergic to. These are some things you can do to help avoid allergens: ? Replace carpet with wood, tile, or vinyl flooring. Carpet can trap dander and dust. ? Do not smoke. Do not allow smoking in your home. ? Change your heating and air conditioning filter at least once a month. ? During allergy season:  Keep windows closed as much as possible.  Plan outdoor activities when pollen counts are lowest. This is usually during the evening hours.  When coming indoors, change clothing and shower before sitting on furniture or bedding.  Take over-the-counter and prescription medicines only as told by your health care provider.  Keep all follow-up visits as told by your health care provider. This is important. Contact a health care provider if:  You have a fever.  You develop a persistent cough.  You make whistling sounds when you breathe (you wheeze).  Your symptoms interfere with your normal daily activities. Get help right away if:  You have shortness of breath. Summary  This condition can be managed by taking medicines as directed and avoiding allergens.  Contact your health care provider if you develop a persistent cough or fever.  During allergy season, keep windows closed as much as possible. This information is not intended to replace advice given to you by your health care provider. Make sure you discuss any questions you have with your health care provider. Document Revised: 07/17/2017 Document Reviewed: 09/11/2016 Elsevier Patient Education  2020   [place]. Eustachian Tube Dysfunction  Eustachian tube dysfunction refers to a condition in which a blockage develops in the narrow passage that connects the middle ear to the back of the nose (eustachian tube). The eustachian tube regulates air  pressure in the middle ear by letting air move between the ear and nose. It also helps to drain fluid from the middle ear space. Eustachian tube dysfunction can affect one or both ears. When the eustachian tube does not function properly, air pressure, fluid, or both can build up in the middle ear. What are the causes? This condition occurs when the eustachian tube becomes blocked or cannot open normally. Common causes of this condition include:  Ear infections.  Colds and other infections that affect the nose, mouth, and throat (upper respiratory tract).  Allergies.  Irritation from cigarette smoke.  Irritation from stomach acid coming up into the esophagus (gastroesophageal reflux). The esophagus is the tube that carries food from the mouth to the stomach.  Sudden changes in air pressure, such as from descending in an airplane or scuba diving.  Abnormal growths in the nose or throat, such as: ? Growths that line the nose (nasal polyps). ? Abnormal growth of cells (tumors). ? Enlarged tissue at the back of the throat (adenoids). What increases the risk? You are more likely to develop this condition if:  You smoke.  You are overweight.  You are a child who has: ? Certain birth defects of the mouth, such as cleft palate. ? Large tonsils or adenoids. What are the signs or symptoms? Common symptoms of this condition include:  A feeling of fullness in the ear.  Ear pain.  Clicking or popping noises in the ear.  Ringing in the ear.  Hearing loss.  Loss of balance.  Dizziness. Symptoms may get worse when the air pressure around you changes, such as when you travel to an area of high elevation, fly on an airplane, or go scuba diving. How is this diagnosed? This condition may be diagnosed based on:  Your symptoms.  A physical exam of your ears, nose, and throat.  Tests, such as those that measure: ? The movement of your eardrum (tympanogram). ? Your hearing  (audiometry). How is this treated? Treatment depends on the cause and severity of your condition.  In mild cases, you may relieve your symptoms by moving air into your ears. This is called "popping the ears."  In more severe cases, or if you have symptoms of fluid in your ears, treatment may include: ? Medicines to relieve congestion (decongestants). ? Medicines that treat allergies (antihistamines). ? Nasal sprays or ear drops that contain medicines that reduce swelling (steroids). ? A procedure to drain the fluid in your eardrum (myringotomy). In this procedure, a small tube is placed in the eardrum to:  Drain the fluid.  Restore the air in the middle ear space. ? A procedure to insert a balloon device through the nose to inflate the opening of the eustachian tube (balloon dilation). Follow these instructions at home: Lifestyle  Do not do any of the following until your health care provider approves: ? Travel to high altitudes. ? Fly in airplanes. ? Work in a [employer] scheme manager or room. ? Scuba dive.  Do not use any products that contain nicotine or tobacco, such as cigarettes and e-cigarettes. If you need help quitting, ask your health care provider.  Keep your ears dry. Wear fitted earplugs during showering and bathing. Dry your ears completely after. General instructions  Take over-the-counter and prescription medicines only as told by your health care provider.  Use techniques to help pop your ears as recommended by your health care provider. These may include: ? Chewing gum. ? Yawning. ? Frequent, forceful swallowing. ? Closing your mouth, holding your nose closed, and gently blowing as if you are trying to blow air out of your nose.  Keep all follow-up visits as told by your health care provider. This is important. Contact a health care provider if:  Your symptoms do not go away after treatment.  Your symptoms come back after treatment.  You are unable to pop your  ears.  You have: ? A fever. ? Pain in your ear. ? Pain in your head or neck. ? Fluid draining from your ear.  Your hearing suddenly changes.  You become very dizzy.  You lose your balance. Summary  Eustachian tube dysfunction refers to a condition in which a blockage develops in the eustachian tube.  It can be caused by ear infections, allergies, inhaled irritants, or abnormal growths in the nose or throat.  Symptoms include ear pain, hearing loss, or ringing in the ears.  Mild cases are treated with maneuvers to unblock the ears, such as yawning or ear popping.  Severe cases are treated with medicines. Surgery may also be done (rare). This information is not intended to replace advice given to you by your health care provider. Make sure you discuss any questions you have with your health care provider. Document Revised: 11/24/2017 Document Reviewed: 11/24/2017 Elsevier Patient Education  [place]. Sinusitis, Adult Sinusitis is inflammation of your sinuses. Sinuses are hollow spaces in the bones around your face. Your sinuses are located:  Around your eyes.  In the middle of your forehead.  Behind your nose.  In your cheekbones. Mucus normally drains out of your sinuses. When your nasal tissues become inflamed or swollen, mucus can become trapped or blocked. This allows bacteria, viruses, and fungi to grow, which leads to infection. Most infections of the sinuses are caused by a virus. Sinusitis can develop quickly. It can last for up to 4 weeks (acute) or for more than 12 weeks (chronic). Sinusitis often develops after a cold. What are the causes? This condition is caused by anything that creates swelling in the sinuses or stops mucus from draining. This includes:  Allergies.  Asthma.  Infection from bacteria or viruses.  Deformities or blockages in your nose or sinuses.  Abnormal growths in the nose (nasal polyps).  Pollutants, such as chemicals or  irritants in the air.  Infection from fungi (rare). What increases the risk? You are more likely to develop this condition if you:  Have a weak body defense system (immune system).  Do a lot of swimming or diving.  Overuse nasal sprays.  Smoke. What are the signs or symptoms? The main symptoms of this condition are pain and a feeling of pressure around the affected sinuses. Other symptoms include:  Stuffy nose or congestion.  Thick drainage from your nose.  Swelling and warmth over the affected sinuses.  Headache.  Upper toothache.  A cough that may get worse at night.  Extra mucus that collects in the throat or the back of the nose (postnasal drip).  Decreased sense of smell and taste.  Fatigue.  A fever.  Sore throat.  Bad breath. How is this diagnosed? This condition is diagnosed based on:  Your symptoms.  Your medical history.  A physical exam.  Tests to find  out if your condition is acute or chronic. This may include: ? Checking your nose for nasal polyps. ? Viewing your sinuses using a device that has a light (endoscope). ? Testing for allergies or bacteria. ? Imaging tests, such as an MRI or CT scan. In rare cases, a bone biopsy may be done to rule out more serious types of fungal sinus disease. How is this treated? Treatment for sinusitis depends on the cause and whether your condition is chronic or acute.  If caused by a virus, your symptoms should go away on their own within 10 days. You may be given medicines to relieve symptoms. They include: ? Medicines that shrink swollen nasal passages (topical intranasal decongestants). ? Medicines that treat allergies (antihistamines). ? A spray that eases inflammation of the nostrils (topical intranasal corticosteroids). ? Rinses that help get rid of thick mucus in your nose (nasal saline washes).  If caused by bacteria, your health care provider may recommend waiting to see if your symptoms improve.  Most bacterial infections will get better without antibiotic medicine. You may be given antibiotics if you have: ? A severe infection. ? A weak immune system.  If caused by narrow nasal passages or nasal polyps, you may need to have surgery. Follow these instructions at home: Medicines  Take, use, or apply over-the-counter and prescription medicines only as told by your health care provider. These may include nasal sprays.  If you were prescribed an antibiotic medicine, take it as told by your health care provider. Do not stop taking the antibiotic even if you start to feel better. Hydrate and humidify   Drink enough fluid to keep your urine pale yellow. Staying hydrated will help to thin your mucus.  Use a cool mist humidifier to keep the humidity level in your home above 50%.  Inhale steam for 10-15 minutes, 3-4 times a day, or as told by your health care provider. You can do this in the bathroom while a hot shower is running.  Limit your exposure to cool or dry air. Rest  Rest as much as possible.  Sleep with your head raised (elevated).  Make sure you get enough sleep each night. General instructions   Apply a warm, moist washcloth to your face 3-4 times a day or as told by your health care provider. This will help with discomfort.  Wash your hands often with soap and water to reduce your exposure to germs. If soap and water are not available, use hand sanitizer.  Do not smoke. Avoid being around people who are smoking (secondhand smoke).  Keep all follow-up visits as told by your health care provider. This is important. Contact a health care provider if:  You have a fever.  Your symptoms get worse.  Your symptoms do not improve within 10 days. Get help right away if:  You have a severe headache.  You have persistent vomiting.  You have severe pain or swelling around your face or eyes.  You have vision problems.  You develop confusion.  Your neck is stiff.   You have trouble breathing. Summary  Sinusitis is soreness and inflammation of your sinuses. Sinuses are hollow spaces in the bones around your face.  This condition is caused by nasal tissues that become inflamed or swollen. The swelling traps or blocks the flow of mucus. This allows bacteria, viruses, and fungi to grow, which leads to infection.  If you were prescribed an antibiotic medicine, take it as told by your health care provider.  Do not stop taking the antibiotic even if you start to feel better.  Keep all follow-up visits as told by your health care provider. This is important. This information is not intended to replace advice given to you by your health care provider. Make sure you discuss any questions you have with your health care provider. Document Revised: 01/04/2018 Document Reviewed: 01/04/2018 Elsevier Patient Education  [place]. Otitis Media, Adult  Otitis media occurs when there is inflammation and fluid in the middle ear. Your middle ear is a part of the ear that contains bones for hearing as well as air that helps send sounds to your brain. What are the causes? This condition is caused by a blockage in the eustachian tube. This tube drains fluid from the ear to the back of the nose (nasopharynx). A blockage in this tube can be caused by an object or by swelling (edema) in the tube. Problems that can cause a blockage include:  A cold or other upper respiratory infection.  Allergies.  An irritant, such as tobacco smoke.  Enlarged adenoids. The adenoids are areas of soft tissue located high in the back of the throat, behind the nose and the roof of the mouth.  A mass in the nasopharynx.  Damage to the ear caused by pressure changes (barotrauma). What are the signs or symptoms? Symptoms of this condition include:  Ear pain.  A fever.  Decreased hearing.  A headache.  Tiredness (lethargy).  Fluid leaking from the ear.  Ringing in the ear.  How is this diagnosed? This condition is diagnosed with a physical exam. During the exam your health care provider will use an instrument called an otoscope to look into your ear and check for redness, swelling, and fluid. He or she will also ask about your symptoms. Your health care provider may also order tests, such as:  A test to check the movement of the eardrum (pneumatic otoscopy). This test is done by squeezing a small amount of air into the ear.  A test that changes air pressure in the middle ear to check how well the eardrum moves and whether the eustachian tube is working (tympanogram). How is this treated? This condition usually goes away on its own within 3-5 days. But if the condition is caused by a bacteria infection and does not go away own its own, or keeps coming back, your health care provider may:  Prescribe antibiotic medicines to treat the infection.  Prescribe or recommend medicines to control pain. Follow these instructions at home:  Take over-the-counter and prescription medicines only as told by your health care provider.  If you were prescribed an antibiotic medicine, take it as told by your health care provider. Do not stop taking the antibiotic even if you start to feel better.  Keep all follow-up visits as told by your health care provider. This is important. Contact a health care provider if:  You have bleeding from your nose.  There is a lump on your neck.  You are not getting better in 5 days.  You feel worse instead of better. Get help right away if:  You have severe pain that is not controlled with medicine.  You have swelling, redness, or pain around your ear.  You have stiffness in your neck.  A part of your face is paralyzed.  The bone behind your ear (mastoid) is tender when you touch it.  You develop a severe headache. Summary  Otitis media is redness, soreness, and  swelling of the middle ear.  This condition usually goes away on  its own within 3-5 days.  If the problem does not go away in 3-5 days, your health care provider may prescribe or recommend medicines to treat your symptoms.  If you were prescribed an antibiotic medicine, take it as told by your health care provider. This information is not intended to replace advice given to you by your health care provider. Make sure you discuss any questions you have with your health care provider. Document Revised: 07/17/2017 Document Reviewed: 07/25/2016 Elsevier Patient Education  2020 [employer].

## 2019-12-08 NOTE — Progress Notes (Signed)
Subjective:    Patient ID: [name], female    DOB: [birth year], 59 y.o.   MRN: [id number]  59y/o married caucasian female established patient c/o sinus problems for weeks and used box of advil cold and sinus and now having left ear drainage yellow on pillow this morning.  Lymph nodes swollen left side and ear pain/pressure.  Forehead and below eyes sinus pressure.  Yellow cloudy/green mucous discharge from nose.  Using her nose spray and running low on nasal saline.  Decreased hearing acuity/hear fluid in my ears.  Feeling like she needs antibiotic and prednisone (but prefers not to take prednisone if she doesn't have to) again last dispensed dec 2020.  "I haven't been sick since that episode"  Patient working remotely and needs medication mailed or dropped off.  Patient requested telehealth visit today.     Review of Systems  Constitutional: Negative for activity change, appetite change, chills, diaphoresis, fatigue, fever and unexpected weight change.  HENT: Positive for congestion, ear discharge, ear pain, hearing loss, postnasal drip, rhinorrhea, sinus pressure, sinus pain and sneezing. Negative for facial swelling, mouth sores, nosebleeds, trouble swallowing and voice change.   Eyes: Negative for photophobia, pain, discharge, redness, itching and visual disturbance.  Respiratory: Positive for cough. Negative for shortness of breath, wheezing and stridor.   Cardiovascular: Negative for chest pain, palpitations and leg swelling.  Gastrointestinal: Negative for abdominal distention, abdominal pain, diarrhea, nausea and vomiting.  Endocrine: Negative for cold intolerance and heat intolerance.  Genitourinary: Negative for difficulty urinating.  Musculoskeletal: Negative for neck pain and neck stiffness.  Skin: Negative for color change and rash.  Allergic/Immunologic: Positive for environmental allergies. Negative for food allergies and immunocompromised state.  Neurological: Positive  for headaches. Negative for dizziness, tremors, seizures, syncope, facial asymmetry, speech difficulty, weakness, light-headedness and numbness.  Hematological: Negative for adenopathy. Does not bruise/bleed easily.  Psychiatric/Behavioral: Negative for agitation, confusion and sleep disturbance.       Objective:   Physical Exam Constitutional:      General: She is awake. She is not in acute distress. HENT:     Nose: No congestion.  Pulmonary:     Effort: Pulmonary effort is normal. No respiratory distress.     Breath sounds: Normal breath sounds. No stridor. No wheezing, rhonchi or rales.  Neurological:     Mental Status: She is alert and oriented to person, place, and time. Mental status is at baseline.  Psychiatric:        Attention and Perception: Attention and perception normal.        Mood and Affect: Mood and affect normal.        Speech: Speech normal.        Behavior: Behavior normal. Behavior is cooperative.        Thought Content: Thought content normal.        Cognition and Memory: Cognition and memory normal.        Judgment: Judgment normal.           Assessment & Plan:  A-recurrent acute maxillary sinusitis, acute left otitis media, eustachian tube dysfunction right  P-Discussed this flare probably due to thunderstorms, cold front, increased HVAC time on for heat and exposure more dust.  Prednisone taper 10mg  po daily with breakfast 20mg  x 5 day/10mg x2days/5mg  x 2 days #21 RF0 dispensed from [facility] to patient personally delivered to her residence.  Previously taken without side effects and good relief sinusitis pain/pressure/rhinitis.  Continue singulair 10mg  po qhs,  flonase  1 spray each nostril BID, increase frequency of nasal saline 2 sprays each nostril q2h wa prn congestion given 1 bottle from clinic stock.  If no improvement with 48 hours of increased frequency saline, augmentin, singulair, antihistamine, bedtime shower and flonase use start prednisone taper.    augmentin 875mg  po BID x 10 days #20 RF0 dispensed from [facility] to patient  Denied personal or family history of ENT cancer.  Shower BID especially prior to bed. No evidence of systemic bacterial infection, non toxic and well hydrated.  I do not see where any further testing or imaging is necessary at this time.   I will suggest supportive care, rest, good hygiene and encourage the patient to take adequate fluids.  The patient is to return to clinic or EMERGENCY ROOM if symptoms worsen or change significantly.  Exitcare handout on sinusitis and allergic rhinitis printed and given to patient.  Patient verbalized agreement and understanding of treatment plan and had no further questions at this time.   P2:  Hand washing and cover cough  Supportive treatment. Augmentin 875mg  po BID x 10 days #20 RF0 dispensed from [facility] to patient  Tylenol 1000mg  po QID prn pain/fever.   No evidence of invasive bacterial infection, non toxic and well hydrated.  This is most likely self limiting viral infection.  I do not see where any further testing or imaging is necessary at this time.   I will suggest supportive care, rest, good hygiene and encourage the patient to take adequate fluids.  The patient is to return to clinic or EMERGENCY ROOM if symptoms worsen or change significantly e.g. ear pain, fever, purulent discharge from ears or bleeding.  Exitcare handout on otitis media printed and given to patient.   Patient verbalized agreement and understanding of treatment plan and had no further questions at this time.   No evidence of invasive bacterial infection, non toxic and well hydrated.  I do not see where any further testing or imaging is necessary at this time.   I will suggest supportive care, rest, good hygiene and encourage the patient to take adequate fluids.  The patient is to return to clinic or EMERGENCY ROOM if symptoms worsen or change significantly e.g. ear pain, fever, purulent discharge from ears or bleeding.   Exitcare handout on otitis media and eustachian tube dysfunction printed and given to patient.  Discussed with patient post nasal drip irritates throat/causes swelling blocks eustachian tubes from draining and fluid fills up middle ear.  Bacteria/viruses can grow in fluid and with moving head tube compressed and increases pressure in tube/ear worsening pain.  Studies show will take 30 days for fluid to resolve after post nasal drip controlled with nasal steroid/antihistamine. Antibiotics and steroids do not speed up fluid removal.  Patient verbalized agreement and understanding of treatment plan and had no further questions at this time.

## 2020-01-20 ENCOUNTER — Encounter: Payer: Self-pay | Admitting: Family Medicine

## 2020-01-20 ENCOUNTER — Other Ambulatory Visit: Payer: Self-pay | Admitting: Family Medicine

## 2020-01-22 ENCOUNTER — Other Ambulatory Visit: Payer: Self-pay | Admitting: Family Medicine

## 2020-01-22 MED ORDER — GABAPENTIN 300 MG PO CAPS: 900.0000 mg | ORAL_CAPSULE | Freq: Every day | ORAL | 1 refills | Status: DC

## 2020-02-07 ENCOUNTER — Other Ambulatory Visit: Payer: Self-pay | Admitting: Family Medicine

## 2020-02-07 NOTE — Telephone Encounter (Signed)
Electronic refill request. Diclofenac Gel Last office visit:   09/19/2019 Last Filled:    100 g 3 07/02/2018  Please advise.

## 2020-02-08 NOTE — Telephone Encounter (Signed)
Sent. Thanks.   

## 2020-03-15 ENCOUNTER — Other Ambulatory Visit: Payer: Self-pay | Admitting: Cardiovascular Disease

## 2020-03-15 NOTE — Telephone Encounter (Signed)
Please schedule office visit with Dr. Rockey Situ for refills. Thank you!

## 2020-03-16 ENCOUNTER — Encounter: Payer: Self-pay | Admitting: Family Medicine

## 2020-03-16 NOTE — Telephone Encounter (Signed)
Scheduled on 8/10 with Mickle Plumb

## 2020-03-27 ENCOUNTER — Other Ambulatory Visit: Payer: Self-pay

## 2020-03-27 ENCOUNTER — Ambulatory Visit: Payer: PRIVATE HEALTH INSURANCE | Admitting: Physician Assistant

## 2020-03-27 ENCOUNTER — Encounter: Payer: Self-pay | Admitting: Physician Assistant

## 2020-03-27 VITALS — BP 120/80 | HR 75 | Ht 67.0 in | Wt 282.0 lb

## 2020-03-27 DIAGNOSIS — R05 Cough: Secondary | ICD-10-CM

## 2020-03-27 DIAGNOSIS — R059 Cough, unspecified: Secondary | ICD-10-CM

## 2020-03-27 DIAGNOSIS — I10 Essential (primary) hypertension: Secondary | ICD-10-CM

## 2020-03-27 DIAGNOSIS — E785 Hyperlipidemia, unspecified: Secondary | ICD-10-CM | POA: Diagnosis not present

## 2020-03-27 DIAGNOSIS — R609 Edema, unspecified: Secondary | ICD-10-CM | POA: Diagnosis not present

## 2020-03-27 DIAGNOSIS — R0600 Dyspnea, unspecified: Secondary | ICD-10-CM

## 2020-03-27 NOTE — Progress Notes (Signed)
Office Visit    Patient Name: [name] Date of Encounter: 03/28/2020  Primary Care Provider:  Tonia Ghent, MD Primary Cardiologist:  Ida Rogue, MD  Chief Complaint    Chief Complaint  Patient presents with  . other    C/o cough and phelgm/ sob. Meds reviewed verbally with pt.    59 year old female with history of chronic cough (5 years), LHC 2002 with Dr. Humphrey Rolls with nl cors, and here today for cough, chest pain, and bilateral lower extremity edema.  Past Medical History    Past Medical History:  Diagnosis Date  . Anxiety   . History of colon polyps   . History of eosinophilia    ESOPHAGEAL  . Hyperlipidemia   . Hypothyroidism   . Migraine   . Neuropathy   . Nocturia   . Pelvic prolapse   . Seasonal allergies   . Urgency of urination   . Vitamin D deficiency   . Wears glasses    Past Surgical History:  Procedure Laterality Date  . ABDOMINAL HYSTERECTOMY  2001   w/  Bladder Suspension  . ANTERIOR AND POSTERIOR REPAIR WITH SACROSPINOUS FIXATION N/A 07/24/2014   Procedure: SACROSPINOUS LIGAMENT SUSPENSION ;  Surgeon: Maisie Fus, MD;  Location: [facility];  Service: Gynecology;  Laterality: N/A;  . COLONOSCOPY W/ POLYPECTOMY  04-25-2014  . LAPAROSCOPIC CHOLECYSTECTOMY  2008  . VAGINAL HYSTERECTOMY N/A 07/24/2014   Procedure: RESECTION OF CERVIX ;  Surgeon: Maisie Fus, MD;  Location: [facility];  Service: Gynecology;  Laterality: N/A;    Allergies  Allergies  Allergen Reactions  . Codeine Nausea And Vomiting    History of Present Illness    [name] is a 59 y.o. female with PMH as above.   She denies a personal history of tobacco use now or in the past (though of note, previous progress note dose report she smoked 36 years ago).  She does report a history of secondhand smoke exposure.  Her father smoked cigarettes for 12 years while she was a child and in the same house.  She also notes that her sister  currently smokes and sometimes does so around her.  She denies alcohol or illegal drug use.  She reports a family history of heart disease.  She is uncertain if her mother has heart disease; however, she states her father sees Dr. Rockey Situ.  She has a history of chronic cough.  She first presented to Dr. Rockey Situ 03/08/2019 for chronic cough and lower extremity edema, at which time she reported a cough for 4 years.  It was noted she underwent heart catheterization in 2002 by an outside cardiologist (Dr. Humphrey Rolls) for abnormal EKG and with nl cors. at that time, she noted her cough is worse without Symbicort and better after doubling her dose.  She was taking singular and albuterol.  She noted frequent naps during the day.  She also noted paresthesias in bilateral feet, occurring at night, and for which she was on Neurontin.  She stated her cough is dry and once in a while would produce white sputum.  She had not tried antibiotics.  EKG at that time was without significant ST or T wave changes.  She received her COVID-19 vaccination with [place] [facility] on 3/20 03/06/2020 and 12/03/2019.  Today, she presents to clinic and notes worsening cough, lower extremity edema, and fatigue.  She states she went to visit family in [place] recently and got a  sinus infection. She saw a MD there, who gave her steriods and abx. This reportedly improved all sx except for her cough, which has persisted and has been going on for almost 5 years.  She has been trying to lose weight and notes weight loss of almost 7 pounds. She is walking to lose weight but reports dyspnea with walking even short distances, which limits her activity.  She reports 1 episode of CP that occurred a few weeks ago while walking in the heat  / humidity. Chest pain was pleuritic and located in the center of her chest, rated 5/10 in severity, and associated with feeling lightheadedness but no LOC.  She went back into her cool house and rested with improvement in sx  within 20 minutes. She did not have her inhaler with her and thus does not know if this would have improved her sx.  She reports this CP has not recurred; however, her dyspnea continues to progress and occurs each time she is walking. She reports ongoing LEE that is worse in the heat and improves with elevation and compression stockings.  She states that her right lower extremity edema is worse than that of her left, attributed to falling on her right leg.  She reports new orthopnea that started a couple of months ago. Her coughing continues and now consistently produces white phlegm.  This cough reportedly worsens with increased talking or walking/activity. Last night, she experienced PND, described as waking up choking and coughing. She reports medication compliance with her Lasix 20 mg daily. Of note, she also notes ongoing paresthesias with bilateral feet pain/throbbing and numbness.  She denies any symptoms of claudication.  She has been working on eliminating salt from her diet, describing herself as a "salt-a-holic."  She drinks 1 coffee and 3 water cups (large metal cup) per day.  She denies any racing heart rate or palpitations.  No signs or symptoms of bleeding.  She reports medication compliance.  Home Medications    Prior to Admission medications   Medication Sig Start Date End Date Taking? Authorizing Provider  albuterol (VENTOLIN HFA) 108 (90 Base) MCG/ACT inhaler Inhale 1-2 puffs into the lungs every 4 (four) hours as needed for up to 10 days for wheezing or shortness of breath. 10/12/18 03/07/28 Yes Betancourt, Aura Fey, NP  ARMOUR THYROID 30 MG tablet TAKE ONE TABLET BY MOUTH EVERY MORNING BEFORE BREAKFAST 11/07/19  Yes Tonia Ghent, MD  butalbital-acetaminophen-caffeine (FIORICET) [phone] MG tablet Take 1 tablet by mouth as needed. 10/10/18  Yes Tonia Ghent, MD  cetirizine (ZYRTEC) 10 MG tablet Take 1 tablet (10 mg total) by mouth daily. 03/03/17  Yes Betancourt, Aura Fey, NP    Cholecalciferol (VITAMIN D3) 2000 UNITS TABS Take 6,000 Units by mouth daily. Reported on 10/29/2015   Yes [provider]  Cinnamon 500 MG capsule Take 500 mg by mouth daily.   Yes [provider]  diclofenac Sodium (VOLTAREN) 1 % GEL APPLY 2 GRAMS TOPICALLY FOUR TIMES A DAY AS NEEDED 02/08/20  Yes Tonia Ghent, MD  fluticasone [facility]) 50 MCG/ACT nasal spray Place 1 spray into both nostrils 2 (two) times daily. 10/12/18  Yes Betancourt, Aura Fey, NP  furosemide (LASIX) 20 MG tablet TAKE ONE TABLET BY MOUTH DAILY AS NEEDED FOR LEG SWELLING 03/16/20  Yes Gollan, Kathlene November, MD  gabapentin (NEURONTIN) 300 MG capsule Take 3 capsules (900 mg total) by mouth at bedtime. 01/22/20  Yes Tonia Ghent, MD  gemfibrozil (LOPID) 600  MG tablet Take 1 tablet (600 mg total) by mouth 2 (two) times daily before a meal. 09/19/19  Yes Tonia Ghent, MD  Halcinonide (HALOG) 0.1 % CREA Apply to affected area twice a day as needed. 09/19/19  Yes Tonia Ghent, MD  hydrochlorothiazide (MICROZIDE) 12.5 MG capsule TAKE 1 CAPSULE BY MOUTH DAILY AS NEEDED FOR LOWER EXTREMITY EDEMA. 09/19/19  Yes Tonia Ghent, MD  ibuprofen (ADVIL) 800 MG tablet Take 1 tablet (800 mg total) by mouth as needed (with food). 09/19/19  Yes Tonia Ghent, MD  montelukast (SINGULAIR) 10 MG tablet TAKE ONE TABLET BY MOUTH EVERY NIGHT AT BEDTIME 08/07/19  Yes Betancourt, Tina A, NP  Multiple Vitamin (MULTIVITAMIN) capsule Take 1 capsule by mouth daily.   Yes [provider]  Multiple Vitamins-Minerals (PRESERVISION AREDS) TABS Take 1 tablet by mouth daily. 09/14/18  Yes Tonia Ghent, MD  omeprazole (PRILOSEC) 40 MG capsule Take 1 capsule (40 mg total) by mouth at bedtime. 09/19/19  Yes Tonia Ghent, MD  Potassium 99 MG TABS Take 1-2 tablets (99-198 mg total) by mouth daily. 09/19/19  Yes Tonia Ghent, MD  sodium chloride (OCEAN) 0.65 % SOLN nasal spray Place 2 sprays into both nostrils every 2 (two) hours while  awake. 12/08/19 03/27/20 Yes Betancourt, Aura Fey, NP  SUMAtriptan (IMITREX) 20 MG/ACT nasal spray Place 1 spray (20 mg total) into the nose as needed. 12/17/15  Yes Lucille Passy, MD  SYMBICORT 160-4.5 MCG/ACT inhaler INHALE TWO PUFFS BY MOUTH TWICE A DAY 12/17/18  Yes Wilhelmina Mcardle, MD  TURMERIC CURCUMIN PO Take by mouth.   Yes [provider]  vitamin B-12 (CYANOCOBALAMIN) 1000 MCG tablet Take 1 tablet (1,000 mcg total) by mouth daily. 12/27/17  Yes Tonia Ghent, MD    Review of Systems    She denies palpitations, n, v, syncope, weight gain, or early satiety.  She reports one episode of chest pain and lightheadedness as described above and while walking.  She reports dyspnea, chronic productive cough, PND, orthopnea, and edema.   All other systems reviewed and are otherwise negative except as noted above.  Physical Exam    VS:  BP 120/80 (BP Location: Left Arm, Patient Position: Sitting, Cuff Size: Large)   Pulse 75   Ht 5\' 7"  (1.702 m)   Wt 282 lb (127.9 kg)   SpO2 99%   BMI 44.17 kg/m  , BMI Body mass index is 44.17 kg/m. GEN: Well nourished, well developed, in no acute distress. HEENT: normal. Neck: Supple, no JVD, carotid bruits, or masses. Cardiac: RRR, no murmurs, rubs, or gallops. No clubbing, cyanosis.  Bilateral moderate to 1+ edema, worse on right lower extremity as compared with left lower extremity..  Radials/DP/PT 2+ and equal bilaterally.  Respiratory:  Respirations regular and unlabored, clear to auscultation bilaterally. GI: Soft, nontender, nondistended, BS + x 4. MS: no deformity or atrophy. Skin: warm and dry, no rash. Neuro:  Strength and sensation are intact. Psych: Normal affect.  Accessory Clinical Findings    ECG personally reviewed by me today - NSR, 75bpm, nonspecific changes in lateral leads as seen on previous EKGs - no acute changes.  VITALS Reviewed today   Temp Readings from Last 3 Encounters:  09/19/19 (!) 96.5 F (35.8 C) (Temporal)    08/04/19 97.8 F (36.6 C) (Tympanic)  07/21/19 (!) 96.9 F (36.1 C) (Oral)   BP Readings from Last 3 Encounters:  03/27/20 120/80  09/19/19 136/74  08/04/19  140/72   Pulse Readings from Last 3 Encounters:  03/27/20 75  09/19/19 95  08/04/19 81    Wt Readings from Last 3 Encounters:  03/27/20 282 lb (127.9 kg)  09/19/19 282 lb 2 oz (128 kg)  03/08/19 276 lb 8 oz (125.4 kg)     LABS  reviewed today   Lab Results  Component Value Date   WBC 8.2 09/15/2019   HGB 12.7 09/15/2019   HCT 38.4 09/15/2019   MCV 90.6 09/15/2019   PLT 313.0 09/15/2019   Lab Results  Component Value Date   CREATININE 0.67 09/15/2019   BUN 14 09/15/2019   NA 139 09/15/2019   K 4.1 09/15/2019   CL 102 09/15/2019   CO2 27 09/15/2019   Lab Results  Component Value Date   ALT 25 09/15/2019   AST 19 09/15/2019   ALKPHOS 69 09/15/2019   BILITOT 0.4 09/15/2019   Lab Results  Component Value Date   CHOL 160 09/15/2019   HDL 43.10 09/15/2019   LDLCALC 93 09/15/2019   LDLDIRECT 98.0 12/17/2016   TRIG 116.0 09/15/2019   CHOLHDL 4 09/15/2019    Lab Results  Component Value Date   HGBA1C 6.0 09/15/2019   Lab Results  Component Value Date   TSH 2.19 09/15/2019     STUDIES/PROCEDURES reviewed today   LHC per Dr. Rockey Situ notes in 2002 by Dr. Humphrey Rolls with nl cors.   Echo 03/2019 1. The left ventricle has normal systolic function with an ejection  fraction of 60-65%. The cavity size was normal. Left ventricular diastolic  Doppler parameters are consistent with impaired relaxation.  2. The right ventricle has normal systolic function. The cavity was  normal. There is no increase in right ventricular wall thickness. Unable  to estimate RVSP.   Assessment & Plan    Chronic productive cough --Reports chronic cough for over 5 years, now consistently producing white phlegm.  Considered as her recent sinus infection and h/o asthma, requiring inhalers as above. 03/2019 echo as above with EF 60  to 28% and diastolic dysfunction.  Unable to estimate RVSP.  Recent history as above concerning for ischemia / progressive heart failure. She reports new orthopnea with PND and progressive chronic cough, edema, and dyspnea. She also reports an episode of chest pain with recommendations as below. LEE on exam but otherwise euvolemic. Wt appears stable from previous and up 6 lbs from last year. Recommend repeat echo to reassess EF, heart pressures, valvular function, and rule out acute structural abnormalities.  If echo unrevealing or of poor image quality as above, could consider R/LHC to better understand her hemodynamics and reassess her cors. Further recommendations pending echo.  Chest pain --Reports 1 episode of chest pain with exertion that occurred a few weeks ago while walking.  Chest pain with both typical and atypical features.  Risk factors for cardiac etiology include family history and secondhand smoke and possible personal history of smoking per Dr. Donivan Scull previous progress notes.  EKG without acute changes and relatively euvolemic on exam. Obtain echo as above to reassess EF and rule out acute structural abnormalities. Further ischemic workup includes Lexiscan Myoview, cardiac CT, R/LHC as above and to also better understand her heart pressures given her progressive sx as above. She will call the office if recurrent CP or worsening sx.   Lower extremity edema --She reports compliance with Lasix for her lower extremity edema. Low-salt diet and fluid restriction discussed, given the patient does admit to loving salt as  above in HPI.  Considered also is dependent edema with pt ageement. Continue current dose Lasix, compression stockings, elevation. Recommend repeat echo as above with further recommendations at that time.  Hypertension --BP controlled though diastolic borderline. Continue current medications. Reassess at RTC.  Medication changes: None Labs ordered: None Studies / Imaging  ordered: Echo Future considerations: Pending echo - Lexiscan Myoview, cardiac CT, R/LHC. Increased dose lasix if indicated.  Disposition: RTC after echo.    Arvil Chaco, PA-C

## 2020-03-27 NOTE — Patient Instructions (Signed)
Medication Instructions:  Your physician recommends that you continue on your current medications as directed. Please refer to the Current Medication list given to you today.  *If you need a refill on your cardiac medications before your next appointment, please call your pharmacy*   Lab Work: None ordered If you have labs (blood work) drawn today and your tests are completely normal, you will receive your results only by: Marland Kitchen MyChart Message (if you have MyChart) OR . A paper copy in the mail If you have any lab test that is abnormal or we need to change your treatment, we will call you to review the results.   Testing/Procedures: Echo  Please return to [facility] on ______________ at _______________ AM/PM for an Echocardiogram. Your physician has requested that you have an echocardiogram. Echocardiography is a painless test that uses sound waves to create images of your heart. It provides your doctor with information about the size and shape of your heart and how well your heart's chambers and valves are working. This procedure takes approximately one hour. There are no restrictions for this procedure. Please note; depending on visual quality an IV may need to be placed.     Follow-Up: At [facility], you and your health needs are our priority.  As part of our continuing mission to provide you with exceptional heart care, we have created designated Provider Care Teams.  These Care Teams include your primary Cardiologist (physician) and Advanced Practice Providers (APPs -  Physician Assistants and Nurse Practitioners) who all work together to provide you with the care you need, when you need it.  We recommend signing up for the patient portal called "MyChart".  Sign up information is provided on this After Visit Summary.  MyChart is used to connect with patients for Virtual Visits (Telemedicine).  Patients are able to view lab/test results, encounter notes, upcoming  appointments, etc.  Non-urgent messages can be sent to your provider as well.   To learn more about what you can do with MyChart, go to [email or web address].    Your next appointment:   1 month(s)  The format for your next appointment:   In Person  Provider:     You may see Dr. Rockey Situ or Marrianne Mood, PA-C

## 2020-04-17 ENCOUNTER — Other Ambulatory Visit: Payer: Self-pay

## 2020-04-17 ENCOUNTER — Ambulatory Visit (INDEPENDENT_AMBULATORY_CARE_PROVIDER_SITE_OTHER): Payer: PRIVATE HEALTH INSURANCE

## 2020-04-17 DIAGNOSIS — R0600 Dyspnea, unspecified: Secondary | ICD-10-CM | POA: Diagnosis not present

## 2020-04-17 LAB — ECHOCARDIOGRAM COMPLETE
Area-P 1/2: 3.93 cm2
S' Lateral: 3.1 cm

## 2020-04-26 ENCOUNTER — Telehealth: Payer: Self-pay

## 2020-04-26 DIAGNOSIS — I10 Essential (primary) hypertension: Secondary | ICD-10-CM

## 2020-04-26 NOTE — Telephone Encounter (Signed)
Call to patient to review labs.    Pt verbalized understanding and has no further questions at this time.    Advised pt to call for any further questions or concerns.  Pt agrees to [employer]. Is currently taking lasix every other day.   Coming on Monday for appt. Wants to wait until then to make any further changes.

## 2020-04-26 NOTE — Telephone Encounter (Signed)
-----   Message from Arvil Chaco, PA-C sent at 04/25/2020  5:03 PM EDT ----- Echo shows normal pump function and that the walls of the heart are moving appropriately though somewhat stiff. We will want to ensure that we control her blood pressure. The pressures on the right side of the heart are elevated, which implies that she is holding on to some fluid. Please have her get a BMET now and let's increase her from lasix 20mg  daily (confirm that she is still taking this daily, rather than PRN), to lasix 20mg  twice daily with repeat BMET in 1 week. Please have her double her usual potassium supplementation at that time as well.

## 2020-04-27 ENCOUNTER — Other Ambulatory Visit
Admission: RE | Admit: 2020-04-27 | Discharge: 2020-04-27 | Disposition: A | Discharge status: Home / Self Care | Payer: PRIVATE HEALTH INSURANCE | Attending: Physician Assistant | Admitting: Physician Assistant

## 2020-04-27 DIAGNOSIS — I10 Essential (primary) hypertension: Secondary | ICD-10-CM

## 2020-04-27 LAB — BASIC METABOLIC PANEL
Anion gap: 11 (ref 5–15)
BUN: 15 mg/dL (ref 6–20)
CO2: 27 mmol/L (ref 22–32)
Calcium: 9.6 mg/dL (ref 8.9–10.3)
Chloride: 102 mmol/L (ref 98–111)
Creatinine, Ser: 0.74 mg/dL (ref 0.44–1.00)
GFR calc Af Amer: >60 mL/min (ref >60)
GFR calc non Af Amer: >60 mL/min (ref >60)
Glucose, Bld: 158 mg/dL — ABNORMAL HIGH (ref 70–99)
Potassium: 4.3 mmol/L (ref 3.5–5.1)
Sodium: 140 mmol/L (ref 135–145)

## 2020-04-29 NOTE — Progress Notes (Signed)
Office Visit    Patient Name: [name] Date of Encounter: 04/30/2020  Primary Care Provider:  Tonia Ghent, MD Primary Cardiologist:  Ida Rogue, MD  Chief Complaint    Chief Complaint  Patient presents with  . office visit    F/U after echo; Meds verbally reviewed with patient.    59 year old female with history of chronic cough (5 years), LHC 2002 with Dr. Humphrey Rolls with nl cors, and here today for follow-up of her echo.  Past Medical History    Past Medical History:  Diagnosis Date  . Anxiety   . History of colon polyps   . History of eosinophilia    ESOPHAGEAL  . Hyperlipidemia   . Hypothyroidism   . Migraine   . Neuropathy   . Nocturia   . Pelvic prolapse   . Seasonal allergies   . Urgency of urination   . Vitamin D deficiency   . Wears glasses    Past Surgical History:  Procedure Laterality Date  . ABDOMINAL HYSTERECTOMY  2001   w/  Bladder Suspension  . ANTERIOR AND POSTERIOR REPAIR WITH SACROSPINOUS FIXATION N/A 07/24/2014   Procedure: SACROSPINOUS LIGAMENT SUSPENSION ;  Surgeon: Maisie Fus, MD;  Location: [facility];  Service: Gynecology;  Laterality: N/A;  . COLONOSCOPY W/ POLYPECTOMY  04-25-2014  . LAPAROSCOPIC CHOLECYSTECTOMY  2008  . VAGINAL HYSTERECTOMY N/A 07/24/2014   Procedure: RESECTION OF CERVIX ;  Surgeon: Maisie Fus, MD;  Location: [facility];  Service: Gynecology;  Laterality: N/A;    Allergies  Allergies  Allergen Reactions  . Codeine Nausea And Vomiting    History of Present Illness    [name] is a 59 y.o. female with PMH as above.   She has a past history of smoking (~36 years ago).  She does report a history of secondhand smoke exposure.  Her father smoked cigarettes for 12 years while she was a child and in the same house.  She also notes that her sister currently smokes and sometimes does so around her.  She denies alcohol or illegal drug use.  She reports a family history  of heart disease.  She is uncertain if her mother has heart disease; however, she states her father sees Dr. Rockey Situ.  She has a history of chronic cough.  She first presented to Dr. Rockey Situ 03/08/2019 for chronic cough and lower extremity edema, at which time she reported a cough for 4 years.  It was noted she underwent heart catheterization in 2002 by an outside cardiologist (Dr. Humphrey Rolls) for abnormal EKG and with nl cors. At that time, she noted her cough was worse without Symbicort and better after doubling her dose.  She was taking singular and albuterol.  She noted frequent naps during the day.  She also noted paresthesias in bilateral feet, occurring at night, and for which she was on Neurontin.  She stated her cough is dry and once in a while would produce white sputum.  She had not tried antibiotics.  EKG at that time was without significant ST or T wave changes.  She received her COVID-19 vaccination with [place] on 3/20 03/06/2020 and 12/03/2019.  When last seen in clinic 03/27/20, she noted progressive cough, lower extremity edema, and fatigue.  She noted chronic cough, reportedly ongoing 5 years, and now productive with white phlegm.  Cough was exacerbated with talking and walking. She has been trying to lose weight (reporting wt loss of 7  pounds), but noted DOE when walking even short distances. She had 1 episode of CP that occurred a few weeks prior while walking in the heat  / humidity. Chest pain was pleuritic and substernal, rated 5/10 in severity. She noted associated lightheadedness but no LOC.  Sx improved within 20 minutes of rest in her cool house. She wonders if her inhaler would have improved her sx. She noted LEE, exacerbated by the heat and improving with elevation and compression stockings.  RLEE>LLEE and chronic.  She reported new orthopnea x2 months. The night before her visit, she experienced PND. She reported medication compliance with her Lasix 20mg  with KCl tab every other day and HCTZ  on the other days. She had ongoing paresthesias with bilateral feet pain/throbbing and numbness without claudication.  She admitted to being a  "salt-a-holic" and advised to keep salt under 2g/day.  She drank 1 coffee and 3 water cups (large metal cup) per day with recommendation for total fluid under 2L daily.  Given her progressive sx, echo was ordered to further assess her EF, heart pressures, and heart structure. Subsequent echo showed nl pump function 60-65%, G2DD, nl PASP, RVSP 32.72mmHg, RAP 65mmHg (versus 3 mmHg per IVC measurements).  BMET was obtained for baseline renal function with plan to adjust lasix / KCl tab at RTC.   Today, 04/30/2020, she returns to clinic and notes that she has not had any further episodes of chest discomfort, similar to that described above.  She does note a couple of episodes of tingling of the left side of her chest when doing chores around the house.  This usually last 2 to 3 minutes and self resolves with hydrating.  She continues to experience dyspnea, which interferes with her ability to work out.  She reports ongoing lower extremity edema, for which she has started wearing support stockings.  She reports ongoing orthopnea, currently using 2 pillows per night, as she otherwise wakes up choking.  She has not had any further episodes of PND.  She denies any early satiety.  Weight today in clinic is up 1 pound from that of her previous weight.  She does note that she originally had increased urine output on HCTZ; however, this has dissipated with time.  She notes that she never had increased urine output on Lasix.  BP today is slightly elevated from that of previous at 140/90 with patient wondering if this may be due to her having a hot flash at the time at which her BP was taken.  She reports ongoing issues with bilateral feet pain/throbbing and numbness.  Echocardiogram reviewed today.  She reports medication compliance.  No reported signs or symptoms of bleeding.   Home  Medications    Prior to Admission medications   Medication Sig Start Date End Date Taking? Authorizing Provider  albuterol (VENTOLIN HFA) 108 (90 Base) MCG/ACT inhaler Inhale 1-2 puffs into the lungs every 4 (four) hours as needed for up to 10 days for wheezing or shortness of breath. 10/12/18 03/07/28 Yes Betancourt, Aura Fey, NP  ARMOUR THYROID 30 MG tablet TAKE ONE TABLET BY MOUTH EVERY MORNING BEFORE BREAKFAST 11/07/19  Yes Tonia Ghent, MD  butalbital-acetaminophen-caffeine (FIORICET) [phone] MG tablet Take 1 tablet by mouth as needed. 10/10/18  Yes Tonia Ghent, MD  cetirizine (ZYRTEC) 10 MG tablet Take 1 tablet (10 mg total) by mouth daily. 03/03/17  Yes Betancourt, Aura Fey, NP  Cholecalciferol (VITAMIN D3) 2000 UNITS TABS Take 6,000 Units by mouth daily. Reported  on 10/29/2015   Yes [provider]  Cinnamon 500 MG capsule Take 500 mg by mouth daily.   Yes [provider]  diclofenac Sodium (VOLTAREN) 1 % GEL APPLY 2 GRAMS TOPICALLY FOUR TIMES A DAY AS NEEDED 02/08/20  Yes Tonia Ghent, MD  fluticasone [facility]) 50 MCG/ACT nasal spray Place 1 spray into both nostrils 2 (two) times daily. 10/12/18  Yes Betancourt, Aura Fey, NP  furosemide (LASIX) 20 MG tablet TAKE ONE TABLET BY MOUTH DAILY AS NEEDED FOR LEG SWELLING 03/16/20  Yes Gollan, Kathlene November, MD  gabapentin (NEURONTIN) 300 MG capsule Take 3 capsules (900 mg total) by mouth at bedtime. 01/22/20  Yes Tonia Ghent, MD  gemfibrozil (LOPID) 600 MG tablet Take 1 tablet (600 mg total) by mouth 2 (two) times daily before a meal. 09/19/19  Yes Tonia Ghent, MD  Halcinonide (HALOG) 0.1 % CREA Apply to affected area twice a day as needed. 09/19/19  Yes Tonia Ghent, MD  hydrochlorothiazide (MICROZIDE) 12.5 MG capsule TAKE 1 CAPSULE BY MOUTH DAILY AS NEEDED FOR LOWER EXTREMITY EDEMA. 09/19/19  Yes Tonia Ghent, MD  ibuprofen (ADVIL) 800 MG tablet Take 1 tablet (800 mg total) by mouth as needed (with food). 09/19/19  Yes  Tonia Ghent, MD  montelukast (SINGULAIR) 10 MG tablet TAKE ONE TABLET BY MOUTH EVERY NIGHT AT BEDTIME 08/07/19  Yes Betancourt, Tina A, NP  Multiple Vitamin (MULTIVITAMIN) capsule Take 1 capsule by mouth daily.   Yes [provider]  Multiple Vitamins-Minerals (PRESERVISION AREDS) TABS Take 1 tablet by mouth daily. 09/14/18  Yes Tonia Ghent, MD  omeprazole (PRILOSEC) 40 MG capsule Take 1 capsule (40 mg total) by mouth at bedtime. 09/19/19  Yes Tonia Ghent, MD  Potassium 99 MG TABS Take 1-2 tablets (99-198 mg total) by mouth daily. 09/19/19  Yes Tonia Ghent, MD  sodium chloride (OCEAN) 0.65 % SOLN nasal spray Place 2 sprays into both nostrils every 2 (two) hours while awake. 12/08/19 03/27/20 Yes Betancourt, Aura Fey, NP  SUMAtriptan (IMITREX) 20 MG/ACT nasal spray Place 1 spray (20 mg total) into the nose as needed. 12/17/15  Yes Lucille Passy, MD  SYMBICORT 160-4.5 MCG/ACT inhaler INHALE TWO PUFFS BY MOUTH TWICE A DAY 12/17/18  Yes Wilhelmina Mcardle, MD  TURMERIC CURCUMIN PO Take by mouth.   Yes [provider]  vitamin B-12 (CYANOCOBALAMIN) 1000 MCG tablet Take 1 tablet (1,000 mcg total) by mouth daily. 12/27/17  Yes Tonia Ghent, MD    Review of Systems    She denies palpitations, n, v, syncope, PND, weight gain, or early satiety.  She reports 1-2 episodes of chest tingling.  She reports dyspnea, chronic productive cough, orthopnea, and edema.   All other systems reviewed and are otherwise negative except as noted above.  Physical Exam    VS:  BP 140/90 (BP Location: Left Arm, Patient Position: Sitting, Cuff Size: Large)   Pulse 83   Ht 5\' 7"  (1.702 m)   Wt 283 lb (128.4 kg)   SpO2 97%   BMI 44.32 kg/m  , BMI Body mass index is 44.32 kg/m. GEN: Well nourished, well developed, in no acute distress. HEENT: normal. Neck: Supple, no JVD, carotid bruits, or masses. Cardiac: RRR, no murmurs, rubs, or gallops. No clubbing, cyanosis.  Bilateral moderate edema,  worse on right lower extremity as compared with left lower extremity.  Radials/DP/PT 2+ and equal bilaterally.  Respiratory:  Respirations regular and unlabored, clear  to auscultation bilaterally. GI: Soft, nontender, nondistended, BS + x 4. MS: no deformity or atrophy. Skin: warm and dry, no rash. Neuro:  Strength and sensation are intact. Psych: Normal affect.  Accessory Clinical Findings    ECG personally reviewed by me today - NSR, 83bpm, nonspecific changes in lateral leads as seen on previous EKGs - no acute changes.  VITALS Reviewed today   Temp Readings from Last 3 Encounters:  09/19/19 (!) 96.5 F (35.8 C) (Temporal)  08/04/19 97.8 F (36.6 C) (Tympanic)  07/21/19 (!) 96.9 F (36.1 C) (Oral)   BP Readings from Last 3 Encounters:  04/30/20 140/90  03/27/20 120/80  09/19/19 136/74   Pulse Readings from Last 3 Encounters:  04/30/20 83  03/27/20 75  09/19/19 95    Wt Readings from Last 3 Encounters:  04/30/20 283 lb (128.4 kg)  03/27/20 282 lb (127.9 kg)  09/19/19 282 lb 2 oz (128 kg)     LABS  reviewed today   Lab Results  Component Value Date   WBC 8.2 09/15/2019   HGB 12.7 09/15/2019   HCT 38.4 09/15/2019   MCV 90.6 09/15/2019   PLT 313.0 09/15/2019   Lab Results  Component Value Date   CREATININE 0.74 04/27/2020   BUN 15 04/27/2020   NA 140 04/27/2020   K 4.3 04/27/2020   CL 102 04/27/2020   CO2 27 04/27/2020   Lab Results  Component Value Date   ALT 25 09/15/2019   AST 19 09/15/2019   ALKPHOS 69 09/15/2019   BILITOT 0.4 09/15/2019   Lab Results  Component Value Date   CHOL 160 09/15/2019   HDL 43.10 09/15/2019   LDLCALC 93 09/15/2019   LDLDIRECT 98.0 12/17/2016   TRIG 116.0 09/15/2019   CHOLHDL 4 09/15/2019    Lab Results  Component Value Date   HGBA1C 6.0 09/15/2019   Lab Results  Component Value Date   TSH 2.19 09/15/2019     STUDIES/PROCEDURES reviewed today   Echo 04/17/2020 1. Left ventricular ejection fraction, by  estimation, is 60 to 65%. The  left ventricle has normal function. The left ventricle has no regional  wall motion abnormalities. Left ventricular diastolic parameters are  consistent with Grade II diastolic  dysfunction (pseudonormalization).  2. Right ventricular systolic function is normal. The right ventricular  size is normal. There is normal pulmonary artery systolic pressure. The  estimated right ventricular systolic pressure is 25.9 mmHg.   Echo 03/2019 1. The left ventricle has normal systolic function with an ejection  fraction of 60-65%. The cavity size was normal. Left ventricular diastolic  Doppler parameters are consistent with impaired relaxation.  2. The right ventricle has normal systolic function. The cavity was  normal. There is no increase in right ventricular wall thickness. Unable  to estimate RVSP.   LHC per Dr. Rockey Situ notes in 2002 by Dr. Humphrey Rolls with nl cors.    Assessment & Plan    Diastolic dysfunction --Reports chronic cough for over 5 years with new two-pillow orthopnea and progressive edema, as well as dyspnea. She has h/o asthma, requiring inhalers as above. 03/2020 echo with EF 60 to 65%, G2DD, RVSP 32.65mmHg. Wt today up 1 pound from previous clinic weight.  BP elevated from previous clinic BP.  Given her ongoing symptoms and echo as above, increased Lasix 20 mg qd with 2 potassium tablets from every other day to Lasix 20 mg daily with 2 potassium tablets daily.  She will continue to take her HCTZ every  other day for now and monitor blood pressure at home.  We reviewed recommendations to keep fluid under 2 L total daily and keep salt under 2 g total daily.   She has a baseline BMET before clinic, and we will repeat a BMET in 1 week.  We discussed future recommendations may include transition from Lasix to another diuretic, such as torsemide, given she has not noticed a change in urine output with lasix.  Also discussed was her HCTZ, as well as the need to monitor  her potassium closely on HCTZ and lasix to prevent electrolyte abnormalities. Further recommendations regarding potassium, lasix, and HCTZ pending repeat BMET.  She will monitor her blood pressure closely at home and let us know how she feels in a few days.  Chest pain --Reports no further episodes of chest pain as described previously.  Does note occasional tingling in her chest, resolving with drinking water.  Risk factors for cardiac etiology include family history, personal history of smoking, secondhand smoke exposure.  EKG without acute changes.  Echo shows normal pump function.  She will let us know if she has any further chest pain like that previously described. In the meantime, she will also think about a coronary CT, given her risk factors for cardiac etiology, and from a reassurance standpoint.   Hypertension --BP elevated today, though she did state this could be due to hot flashes. Continue current medications with changes as outline above. Discussed possible future medication changes, including possible BB if needed and if BP remains elevated at RTC. Reassess HCTZ and diuretic at RTC as above.   Medication changes: Increase Lasix to 20 mg daily with 2 potassium tablets daily for 1 week with BMET in 1 week and further recommendations at that time. Labs ordered: None Studies / Imaging ordered: None Future considerations: Lexiscan Myoview, cardiac CT, further recommendations regarding Lasix and HCTZ Disposition: RTC 3-4 weeks   Arvil Chaco, PA-C

## 2020-04-30 ENCOUNTER — Ambulatory Visit: Payer: PRIVATE HEALTH INSURANCE | Admitting: Physician Assistant

## 2020-04-30 ENCOUNTER — Other Ambulatory Visit: Payer: Self-pay

## 2020-04-30 ENCOUNTER — Encounter: Payer: Self-pay | Admitting: Physician Assistant

## 2020-04-30 VITALS — BP 140/90 | HR 83 | Ht 67.0 in | Wt 283.0 lb

## 2020-04-30 DIAGNOSIS — Z6841 Body Mass Index (BMI) 40.0 and over, adult: Secondary | ICD-10-CM | POA: Diagnosis not present

## 2020-04-30 DIAGNOSIS — I5189 Other ill-defined heart diseases: Secondary | ICD-10-CM | POA: Diagnosis not present

## 2020-04-30 DIAGNOSIS — I10 Essential (primary) hypertension: Secondary | ICD-10-CM | POA: Diagnosis not present

## 2020-04-30 MED ORDER — FUROSEMIDE 20 MG PO TABS: ORAL_TABLET | ORAL | 3 refills | Status: DC

## 2020-04-30 NOTE — Patient Instructions (Addendum)
Medication Instructions:  1- INCREASE Lasix to take daily 2- INCREASE Potassium to 2 tablets daily *If you need a refill on your cardiac medications before your next appointment, please call your pharmacy*   Lab Work: None ordered If you have labs (blood work) drawn today and your tests are completely normal, you will receive your results only by: Marland Kitchen MyChart Message (if you have MyChart) OR . A paper copy in the mail If you have any lab test that is abnormal or we need to change your treatment, we will call you to review the results.   Testing/Procedures: Think about Coronary artery calcium score - coronary CT  This procedure uses special x-ray equipment to produce pictures of the coronary arteries to determine if they are blocked or narrowed by the buildup of plaque - an indicator for atherosclerosis or coronary artery disease (CAD).   $150 due at visit   [place] location [place]    Follow-Up: At [facility], you and your health needs are our priority.  As part of our continuing mission to provide you with exceptional heart care, we have created designated Provider Care Teams.  These Care Teams include your primary Cardiologist (physician) and Advanced Practice Providers (APPs -  Physician Assistants and Nurse Practitioners) who all work together to provide you with the care you need, when you need it.  We recommend signing up for the patient portal called "MyChart".  Sign up information is provided on this After Visit Summary.  MyChart is used to connect with patients for Virtual Visits (Telemedicine).  Patients are able to view lab/test results, encounter notes, upcoming appointments, etc.  Non-urgent messages can be sent to your provider as well.   To learn more about what you can do with MyChart, go to [email or web address].    Your next appointment:   3-4 week(s)  The format for your next appointment:   In Person  Provider:     You may see  Ida Rogue, MD or Marrianne Mood, PA-C    Other Instructions Stay well hydrated. Please keep your fluid under 2L daily (64 ounces) and 2g of salt daily.   Goal BP <130/80 How to use a home blood pressure monitor. . Be still. Don't smoke, drink caffeinated beverages or exercise within 30 minutes before measuring your blood pressure. . Sit correctly. Sit with your back straight and supported (on a dining chair, rather than a sofa). Your feet should be flat on the floor and your legs should not be crossed. Your arm should be supported on a flat surface (such as a table) with the upper arm at heart level. Make sure the bottom of the cuff is placed directly above the bend of the elbow.  . Measure at the same time every day. It's important to take the readings at the same time each day, such as morning and evening. Take reading approximately 1 hour after BP medications.

## 2020-05-04 DIAGNOSIS — Z79899 Other long term (current) drug therapy: Secondary | ICD-10-CM

## 2020-05-08 ENCOUNTER — Other Ambulatory Visit: Payer: Self-pay | Admitting: Registered Nurse

## 2020-05-08 DIAGNOSIS — J302 Other seasonal allergic rhinitis: Secondary | ICD-10-CM

## 2020-05-09 ENCOUNTER — Telehealth: Payer: Self-pay

## 2020-05-09 NOTE — Telephone Encounter (Signed)
Spoke to patient and relayed Jacquelyn Visser's following note:  Mickle Plumb, Jacquelyn D, PA-C  P C[facility] Triage Can we get a repeat BMET for this patient? I sent her a MyChart message to notify her that I would be ordering the lab.   Patient verbalized understanding and agreed to go get lab draw in the next day or two.

## 2020-05-11 ENCOUNTER — Other Ambulatory Visit
Admission: RE | Admit: 2020-05-11 | Discharge: 2020-05-11 | Disposition: A | Discharge status: Home / Self Care | Payer: PRIVATE HEALTH INSURANCE | Attending: Physician Assistant | Admitting: Physician Assistant

## 2020-05-11 DIAGNOSIS — Z79899 Other long term (current) drug therapy: Secondary | ICD-10-CM | POA: Diagnosis not present

## 2020-05-11 LAB — BASIC METABOLIC PANEL
Anion gap: 12 (ref 5–15)
BUN: 14 mg/dL (ref 6–20)
CO2: 26 mmol/L (ref 22–32)
Calcium: 9.4 mg/dL (ref 8.9–10.3)
Chloride: 104 mmol/L (ref 98–111)
Creatinine, Ser: 0.83 mg/dL (ref 0.44–1.00)
GFR calc Af Amer: >60 mL/min (ref >60)
GFR calc non Af Amer: >60 mL/min (ref >60)
Glucose, Bld: 113 mg/dL — ABNORMAL HIGH (ref 70–99)
Potassium: 4.2 mmol/L (ref 3.5–5.1)
Sodium: 142 mmol/L (ref 135–145)

## 2020-05-15 ENCOUNTER — Encounter: Payer: Self-pay | Admitting: Registered Nurse

## 2020-05-15 ENCOUNTER — Telehealth: Payer: Self-pay | Admitting: Registered Nurse

## 2020-05-15 DIAGNOSIS — J0101 Acute recurrent maxillary sinusitis: Secondary | ICD-10-CM

## 2020-05-15 DIAGNOSIS — H9202 Otalgia, left ear: Secondary | ICD-10-CM

## 2020-05-15 MED ORDER — AMOXICILLIN-POT CLAVULANATE 875-125 MG PO TABS: 1.0000 | ORAL_TABLET | Freq: Two times a day (BID) | ORAL | 0 refills | Status: AC

## 2020-05-15 MED ORDER — PREDNISONE 10 MG PO TABS: ORAL_TABLET | ORAL | 0 refills | Status: AC

## 2020-05-15 NOTE — Telephone Encounter (Signed)
Pt reported to HR and info was passed to RN that pt was unable to complete testing at [facility] but a family member works at a local medical office and pt is going there to complete testing.

## 2020-05-15 NOTE — Telephone Encounter (Signed)
Patient reported that she is working remote today and has been battling a sinus infection and has tried flonase/nasal saline, advil cold and sinus for a week and her regular allergy medications without any relief.  Left ear also hurting "killing her" today.  Lymph nodes swollen on left side, pressure under eyes and mild headache.  Feels like water in her ears when she moves head.  Patient reported she has been social distancing strictly since sister with cancer.  Denied known covid/sick contacts.  Patient is fully covid vaccinated.  Plan for patient to stay home today and tomorrow quarantine today is her 10th day of symptoms started 18/19 Sep.  Reviewed Epic/outpatient chart at [facility] and patient without history of sinus infection in September.  Continue flonase 1 spray each nostril BID, saline 2 sprays each nostril q2h wa prn congestion.  Start augmentin 875mg  po BID x 10 days #20 RF0 and prednisone 10mg  taper po with breakfast (20mg x5d, 10mg x2d, 5mg x2d) #21 RF0 dispensed from [facility] to patient  Denied personal or family history of ENT cancer.  Shower BID especially prior to bed. No evidence of systemic bacterial infection, non toxic and well hydrated.  I do not see where any further testing or imaging is necessary at this time.   I will suggest supportive care, rest, good hygiene and encourage the patient to take adequate fluids.  The patient is to return to clinic or EMERGENCY ROOM if symptoms worsen or change significantly.  Exitcare handout on sinusitis, and sinus rinse.     No evidence of invasive bacterial infection, non toxic and well hydrated.  I do not see where any further testing or imaging is necessary at this time.   I will suggest supportive care, rest, good hygiene and encourage the patient to take adequate fluids.  The patient is to return to clinic or EMERGENCY ROOM if symptoms worsen or change significantly e.g. ear pain, fever, purulent discharge from ears or bleeding.  Exitcare handout  on otitis media and eustachian tube dysfunction printed and given to patient.  Discussed with patient post nasal drip irritates throat/causes swelling blocks eustachian tubes from draining and fluid fills up middle ear.  Bacteria/viruses can grow in fluid and with moving head tube compressed and increases pressure in tube/ear worsening pain.  Studies show will take 30 days for fluid to resolve after post nasal drip controlled with nasal steroid/antihistamine. Antibiotics and steroids do not speed up fluid removal.  Patient verbalized agreement and understanding of treatment plan and had no further questions at this time. P2:  Hand washing and cover cough  Patient preferred drive up covid testing [facility] [name] planning to go this afternoon.  Discussed with patient probably will be negative as outside recommended testing window of days 3-5 covid symptoms for delta variant.  HR manager Hyacinth Meeker notified patient on day 10 of symptoms and recommended covid testing prior to working onsite.   Patient did not develop sx of nausea, vomiting, diarrhea, sore throat, fatigue, body aches, fever or chills.  She did develop congestion, sinus pain/pressure, ear pain, and headache.   Plan for testing on Day 10 today 05/15/2020. Pt lives in [place], [place]. Discussed with patient delta covid variant symptoms can be similar to allergies/sinus infection symptoms without fever in vaccinated individuals.   Discussed with patient appt covid testing available in [place] through [facility] today.  RN Hildred Alamin reviewed drive up testing instructions and directions with pt, ie attend appt time, remain in vehicle,  wear mask, results back in 2-3 days. Will f/u pt on 05/17/2020 for anticipated results. If sx improving and no fever in previous >24hrs without antipyretics, pt to complete quarantine 05/16/2020  Patient scheduled to work remote today, Thursday and Friday.   Pt verbalized understanding and agreement with plan  of care. No further questions/concerns at this time. Pt reminded to contact clinic with any changes in sx or questions/concerns.   Telephone visit today audio only duration 10 minutes with RN Hildred Alamin and NP Valeria Batman

## 2020-05-15 NOTE — Patient Instructions (Addendum)
Otitis Media, Adult  Otitis media occurs when there is inflammation and fluid in the middle ear. Your middle ear is a part of the ear that contains bones for hearing as well as air that helps send sounds to your brain. What are the causes? This condition is caused by a blockage in the eustachian tube. This tube drains fluid from the ear to the back of the nose (nasopharynx). A blockage in this tube can be caused by an object or by swelling (edema) in the tube. Problems that can cause a blockage include:  A cold or other upper respiratory infection.  Allergies.  An irritant, such as tobacco smoke.  Enlarged adenoids. The adenoids are areas of soft tissue located high in the back of the throat, behind the nose and the roof of the mouth.  A mass in the nasopharynx.  Damage to the ear caused by pressure changes (barotrauma). What are the signs or symptoms? Symptoms of this condition include:  Ear pain.  A fever.  Decreased hearing.  A headache.  Tiredness (lethargy).  Fluid leaking from the ear.  Ringing in the ear. How is this diagnosed? This condition is diagnosed with a physical exam. During the exam your health care provider will use an instrument called an otoscope to look into your ear and check for redness, swelling, and fluid. He or she will also ask about your symptoms. Your health care provider may also order tests, such as:  A test to check the movement of the eardrum (pneumatic otoscopy). This test is done by squeezing a small amount of air into the ear.  A test that changes air pressure in the middle ear to check how well the eardrum moves and whether the eustachian tube is working (tympanogram). How is this treated? This condition usually goes away on its own within 3-5 days. But if the condition is caused by a bacteria infection and does not go away own its own, or keeps coming back, your health care provider may:  Prescribe antibiotic medicines to treat the  infection.  Prescribe or recommend medicines to control pain. Follow these instructions at home:  Take over-the-counter and prescription medicines only as told by your health care provider.  If you were prescribed an antibiotic medicine, take it as told by your health care provider. Do not stop taking the antibiotic even if you start to feel better.  Keep all follow-up visits as told by your health care provider. This is important. Contact a health care provider if:  You have bleeding from your nose.  There is a lump on your neck.  You are not getting better in 5 days.  You feel worse instead of better. Get help right away if:  You have severe pain that is not controlled with medicine.  You have swelling, redness, or pain around your ear.  You have stiffness in your neck.  A part of your face is paralyzed.  The bone behind your ear (mastoid) is tender when you touch it.  You develop a severe headache. Summary  Otitis media is redness, soreness, and swelling of the middle ear.  This condition usually goes away on its own within 3-5 days.  If the problem does not go away in 3-5 days, your health care provider may prescribe or recommend medicines to treat your symptoms.  If you were prescribed an antibiotic medicine, take it as told by your health care provider. This information is not intended to replace advice given to you by   your health care provider. Make sure you discuss any questions you have with your health care provider. Document Revised: 07/17/2017 Document Reviewed: 07/25/2016 Elsevier Patient Education  [place].  Sinusitis, Adult Sinusitis is inflammation of your sinuses. Sinuses are hollow spaces in the bones around your face. Your sinuses are located:  Around your eyes.  In the middle of your forehead.  Behind your nose.  In your cheekbones. Mucus normally drains out of your sinuses. When your nasal tissues become inflamed or swollen, mucus  can become trapped or blocked. This allows bacteria, viruses, and fungi to grow, which leads to infection. Most infections of the sinuses are caused by a virus. Sinusitis can develop quickly. It can last for up to 4 weeks (acute) or for more than 12 weeks (chronic). Sinusitis often develops after a cold. What are the causes? This condition is caused by anything that creates swelling in the sinuses or stops mucus from draining. This includes:  Allergies.  Asthma.  Infection from bacteria or viruses.  Deformities or blockages in your nose or sinuses.  Abnormal growths in the nose (nasal polyps).  Pollutants, such as chemicals or irritants in the air.  Infection from fungi (rare). What increases the risk? You are more likely to develop this condition if you:  Have a weak body defense system (immune system).  Do a lot of swimming or diving.  Overuse nasal sprays.  Smoke. What are the signs or symptoms? The main symptoms of this condition are pain and a feeling of pressure around the affected sinuses. Other symptoms include:  Stuffy nose or congestion.  Thick drainage from your nose.  Swelling and warmth over the affected sinuses.  Headache.  Upper toothache.  A cough that may get worse at night.  Extra mucus that collects in the throat or the back of the nose (postnasal drip).  Decreased sense of smell and taste.  Fatigue.  A fever.  Sore throat.  Bad breath. How is this diagnosed? This condition is diagnosed based on:  Your symptoms.  Your medical history.  A physical exam.  Tests to find out if your condition is acute or chronic. This may include: ? Checking your nose for nasal polyps. ? Viewing your sinuses using a device that has a light (endoscope). ? Testing for allergies or bacteria. ? Imaging tests, such as an MRI or CT scan. In rare cases, a bone biopsy may be done to rule out more serious types of fungal sinus disease. How is this  treated? Treatment for sinusitis depends on the cause and whether your condition is chronic or acute.  If caused by a virus, your symptoms should go away on their own within 10 days. You may be given medicines to relieve symptoms. They include: ? Medicines that shrink swollen nasal passages (topical intranasal decongestants). ? Medicines that treat allergies (antihistamines). ? A spray that eases inflammation of the nostrils (topical intranasal corticosteroids). ? Rinses that help get rid of thick mucus in your nose (nasal saline washes).  If caused by bacteria, your health care provider may recommend waiting to see if your symptoms improve. Most bacterial infections will get better without antibiotic medicine. You may be given antibiotics if you have: ? A severe infection. ? A weak immune system.  If caused by narrow nasal passages or nasal polyps, you may need to have surgery. Follow these instructions at home: Medicines  Take, use, or apply over-the-counter and prescription medicines only as told by your health care provider.  These may include nasal sprays.  If you were prescribed an antibiotic medicine, take it as told by your health care provider. Do not stop taking the antibiotic even if you start to feel better. Hydrate and humidify   Drink enough fluid to keep your urine pale yellow. Staying hydrated will help to thin your mucus.  Use a cool mist humidifier to keep the humidity level in your home above 50%.  Inhale steam for 10-15 minutes, 3-4 times a day, or as told by your health care provider. You can do this in the bathroom while a hot shower is running.  Limit your exposure to cool or dry air. Rest  Rest as much as possible.  Sleep with your head raised (elevated).  Make sure you get enough sleep each night. General instructions   Apply a warm, moist washcloth to your face 3-4 times a day or as told by your health care provider. This will help with  discomfort.  Wash your hands often with soap and water to reduce your exposure to germs. If soap and water are not available, use hand sanitizer.  Do not smoke. Avoid being around people who are smoking (secondhand smoke).  Keep all follow-up visits as told by your health care provider. This is important. Contact a health care provider if:  You have a fever.  Your symptoms get worse.  Your symptoms do not improve within 10 days. Get help right away if:  You have a severe headache.  You have persistent vomiting.  You have severe pain or swelling around your face or eyes.  You have vision problems.  You develop confusion.  Your neck is stiff.  You have trouble breathing. Summary  Sinusitis is soreness and inflammation of your sinuses. Sinuses are hollow spaces in the bones around your face.  This condition is caused by nasal tissues that become inflamed or swollen. The swelling traps or blocks the flow of mucus. This allows bacteria, viruses, and fungi to grow, which leads to infection.  If you were prescribed an antibiotic medicine, take it as told by your health care provider. Do not stop taking the antibiotic even if you start to feel better.  Keep all follow-up visits as told by your health care provider. This is important. This information is not intended to replace advice given to you by your health care provider. Make sure you discuss any questions you have with your health care provider. Document Revised: 01/04/2018 Document Reviewed: 01/04/2018 Elsevier Patient Education  [place].  Eustachian Tube Dysfunction  Eustachian tube dysfunction refers to a condition in which a blockage develops in the narrow passage that connects the middle ear to the back of the nose (eustachian tube). The eustachian tube regulates air pressure in the middle ear by letting air move between the ear and nose. It also helps to drain fluid from the middle ear space. Eustachian  tube dysfunction can affect one or both ears. When the eustachian tube does not function properly, air pressure, fluid, or both can build up in the middle ear. What are the causes? This condition occurs when the eustachian tube becomes blocked or cannot open normally. Common causes of this condition include:  Ear infections.  Colds and other infections that affect the nose, mouth, and throat (upper respiratory tract).  Allergies.  Irritation from cigarette smoke.  Irritation from stomach acid coming up into the esophagus (gastroesophageal reflux). The esophagus is the tube that carries food from the mouth to the stomach.  Sudden changes in air pressure, such as from descending in an airplane or scuba diving.  Abnormal growths in the nose or throat, such as: ? Growths that line the nose (nasal polyps). ? Abnormal growth of cells (tumors). ? Enlarged tissue at the back of the throat (adenoids). What increases the risk? You are more likely to develop this condition if:  You smoke.  You are overweight.  You are a child who has: ? Certain birth defects of the mouth, such as cleft palate. ? Large tonsils or adenoids. What are the signs or symptoms? Common symptoms of this condition include:  A feeling of fullness in the ear.  Ear pain.  Clicking or popping noises in the ear.  Ringing in the ear.  Hearing loss.  Loss of balance.  Dizziness. Symptoms may get worse when the air pressure around you changes, such as when you travel to an area of high elevation, fly on an airplane, or go scuba diving. How is this diagnosed? This condition may be diagnosed based on:  Your symptoms.  A physical exam of your ears, nose, and throat.  Tests, such as those that measure: ? The movement of your eardrum (tympanogram). ? Your hearing (audiometry). How is this treated? Treatment depends on the cause and severity of your condition.  In mild cases, you may relieve your symptoms by  moving air into your ears. This is called "popping the ears."  In more severe cases, or if you have symptoms of fluid in your ears, treatment may include: ? Medicines to relieve congestion (decongestants). ? Medicines that treat allergies (antihistamines). ? Nasal sprays or ear drops that contain medicines that reduce swelling (steroids). ? A procedure to drain the fluid in your eardrum (myringotomy). In this procedure, a small tube is placed in the eardrum to:  Drain the fluid.  Restore the air in the middle ear space. ? A procedure to insert a balloon device through the nose to inflate the opening of the eustachian tube (balloon dilation). Follow these instructions at home: Lifestyle  Do not do any of the following until your health care provider approves: ? Travel to high altitudes. ? Fly in airplanes. ? Work in a [employer] manager or room. ? Scuba dive.  Do not use any products that contain nicotine or tobacco, such as cigarettes and e-cigarettes. If you need help quitting, ask your health care provider.  Keep your ears dry. Wear fitted earplugs during showering and bathing. Dry your ears completely after. General instructions  Take over-the-counter and prescription medicines only as told by your health care provider.  Use techniques to help pop your ears as recommended by your health care provider. These may include: ? Chewing gum. ? Yawning. ? Frequent, forceful swallowing. ? Closing your mouth, holding your nose closed, and gently blowing as if you are trying to blow air out of your nose.  Keep all follow-up visits as told by your health care provider. This is important. Contact a health care provider if:  Your symptoms do not go away after treatment.  Your symptoms come back after treatment.  You are unable to pop your ears.  You have: ? A fever. ? Pain in your ear. ? Pain in your head or neck. ? Fluid draining from your ear.  Your hearing suddenly  changes.  You become very dizzy.  You lose your balance. Summary  Eustachian tube dysfunction refers to a condition in which a blockage develops in the eustachian tube.  It can  be caused by ear infections, allergies, inhaled irritants, or abnormal growths in the nose or throat.  Symptoms include ear pain, hearing loss, or ringing in the ears.  Mild cases are treated with maneuvers to unblock the ears, such as yawning or ear popping.  Severe cases are treated with medicines. Surgery may also be done (rare). This information is not intended to replace advice given to you by your health care provider. Make sure you discuss any questions you have with your health care provider. Document Revised: 11/24/2017 Document Reviewed: 11/24/2017 Elsevier Patient Education  [place].  How to Perform a Sinus Rinse A sinus rinse is a home treatment that is used to rinse your sinuses with a sterile mixture of salt and water (saline solution). Sinuses are air-filled spaces in your skull behind the bones of your face and forehead that open into your nasal cavity. A sinus rinse can help to clear mucus, dirt, dust, or pollen from your nasal cavity. You may do a sinus rinse when you have a cold, a virus, nasal allergy symptoms, a sinus infection, or stuffiness in your nose or sinuses. Talk with your health care provider about whether a sinus rinse might help you. What are the risks? A sinus rinse is generally safe and effective. However, there are a few risks, which include:  A burning sensation in your sinuses. This may happen if you do not make the saline solution as directed. Be sure to follow all directions when making the saline solution.  Nasal irritation.  Infection from contaminated water. This is rare, but possible. Do not do a sinus rinse if you have had ear or nasal surgery, ear infection, or blocked ears. Supplies needed:  Saline solution or powder.  Distilled or sterile water may  be needed to mix with saline powder. ? You may use boiled and cooled tap water. Boil tap water for 5 minutes; cool until it is lukewarm. Use within 24 hours. ? Do not use regular tap water to mix with the saline solution.  Neti pot or nasal rinse bottle. These supplies release the saline solution into your nose and through your sinuses. Neti pots and nasal rinse bottles can be purchased at [employer], a health food store, or online. How to perform a sinus rinse  1. Wash your hands with soap and water. 2. Wash your device according to the directions that came with the product and then dry it. 3. Use the solution that comes with your product or one that is sold separately in stores. Follow the mixing directions on the package if you need to mix with sterile or distilled water. 4. Fill the device with the amount of saline solution noted in the device instructions. 5. Stand over a sink and tilt your head sideways over the sink. 6. Place the spout of the device in your upper nostril (the one closer to the ceiling). 7. Gently pour or squeeze the saline solution into your nasal cavity. The liquid should drain out from the lower nostril if you are not too congested. 8. While rinsing, breathe through your open mouth. 9. Gently blow your nose to clear any mucus and rinse solution. Blowing too hard may cause ear pain. 10. Repeat in your other nostril. 11. Clean and rinse your device with clean water and then air-dry it. Talk with your health care provider or pharmacist if you have questions about how to do a sinus rinse. Summary  A sinus rinse is a home treatment  that is used to rinse your sinuses with a sterile mixture of salt and water (saline solution).  A sinus rinse is generally safe and effective. Follow all instructions carefully.  Before doing a sinus rinse, talk with your health care provider about whether it would be helpful for you. This information is not intended to replace  advice given to you by your health care provider. Make sure you discuss any questions you have with your health care provider. Document Revised: 06/01/2017 Document Reviewed: 06/01/2017 Elsevier Patient Education  2020 [employer].

## 2020-05-15 NOTE — Telephone Encounter (Signed)
Pt called RN and reported a negative Covid test result. She went to [facility] and received GenX PCR test this morning and received result this afternoon. Advised pt to still remain off site through Thursday for 2 days of sx treatment to see if sx improve. She plans to not return on-site until next scheduled onsite day of Thurs 10/7 unless dept needs change.

## 2020-05-15 NOTE — Telephone Encounter (Signed)
Noted and agreed with plan of care.  Patient medication delivered to her home by me personally this afternoon.  Called patient x 2 no voicemail no answer.

## 2020-05-16 NOTE — Telephone Encounter (Signed)
Telephone message left for patient checking in to see if symptoms improving or questions or concerns may email me at [email or web address] or RN Hildred Alamin and I in clinic tomorrow [phone] and RN [facility] clinic Friday until noon and I will be calling over the weekend again.

## 2020-05-17 NOTE — Telephone Encounter (Signed)
Patient contacted via telephone and stated feeling better sinus pressure/headache/ear pain/teeth pain much better per patient today.  She is taking her prescribed medication.  Chronic cough continues had more labs at cardiology recently awaiting test results.  Patient reported in 5 years they haven't been able to figure out what is causing her chronic cough.  Patient scheduled to work onsite tomorrow.  Discussed with patient to check in with HR Tim and supervisor prior to returning onsite but cleared by me today to return onsite after negative covid test and improving symptoms with prednisone and augmentin.  Patient verbalized understanding information/instructions, agreed with plan of care and had no further questions at this time.

## 2020-05-20 NOTE — Progress Notes (Signed)
Office Visit    Patient Name: SHAQUIRA MOROZ Date of Encounter: 05/21/2020  Primary Care Provider:  Tonia Ghent, MD Primary Cardiologist:  Ida Rogue, MD  Chief Complaint    Chief Complaint  Patient presents with  . office visit    3 week F/U; Meds verbally reviewed with patient.    59 year old female with history of chronic cough (5 years), LHC 2002 with Dr. Humphrey Rolls with nl cors, and here today for 4 week follow-up on volume status and BP.  Past Medical History    Past Medical History:  Diagnosis Date  . Anxiety   . History of colon polyps   . History of eosinophilia    ESOPHAGEAL  . Hyperlipidemia   . Hypothyroidism   . Migraine   . Neuropathy   . Nocturia   . Pelvic prolapse   . Seasonal allergies   . Urgency of urination   . Vitamin D deficiency   . Wears glasses    Past Surgical History:  Procedure Laterality Date  . ABDOMINAL HYSTERECTOMY  2001   w/  Bladder Suspension  . ANTERIOR AND POSTERIOR REPAIR WITH SACROSPINOUS FIXATION N/A 07/24/2014   Procedure: SACROSPINOUS LIGAMENT SUSPENSION ;  Surgeon: Maisie Fus, MD;  Location: [facility];  Service: Gynecology;  Laterality: N/A;  . COLONOSCOPY W/ POLYPECTOMY  04-25-2014  . LAPAROSCOPIC CHOLECYSTECTOMY  2008  . VAGINAL HYSTERECTOMY N/A 07/24/2014   Procedure: RESECTION OF CERVIX ;  Surgeon: Maisie Fus, MD;  Location: [facility];  Service: Gynecology;  Laterality: N/A;    Allergies  Allergies  Allergen Reactions  . Codeine Nausea And Vomiting    History of Present Illness    MIKAYLEE ARSENEAU is a 59 y.o. female with PMH as above.   She has a past history of smoking (~36 years ago).  She does report a history of secondhand smoke exposure.  Her father smoked cigarettes for 12 years while she was a child and in the same house.  She also notes that her sister currently smokes and sometimes does so around her.  She denies alcohol or illegal drug use.  She reports a  family history of heart disease.  She is uncertain if her mother has heart disease; however, she states her father sees Dr. Rockey Situ.  She has a history of chronic cough.  She first presented to Dr. Rockey Situ 03/08/2019 for chronic cough and lower extremity edema, at which time she reported a cough for 4 years.  It was noted she underwent heart catheterization in 2002 by an outside cardiologist (Dr. Humphrey Rolls) for abnormal EKG and with nl cors. At that time, she noted her cough was worse without Symbicort and better after doubling her dose.  She was taking singular and albuterol.  She noted frequent naps during the day.  She also noted paresthesias in bilateral feet, occurring at night, and for which she was on Neurontin.  She stated her cough is dry and once in a while would produce white sputum.  She had not tried antibiotics.  EKG at that time was without significant ST or T wave changes.  She received her COVID-19 vaccination with [place] on 3/20 03/06/2020 and 12/03/2019.  When seen in clinic 03/27/20, she noted progressive cough x5 years and now productive, lower extremity edema, and fatigue.  Cough was exacerbated with talking and walking. She was trying to lose weight but had reduced exercise tolerance and DOE with short distances. She had 1  episode of CP in the heat  / humidity that was pleuritic and substernal, rated 5/10 in severity. She had associated lightheadedness.  Sx improved within 20 minutes of rest in her cool house. LEE was exacerbated by heat and improved with elevation and compression with chronic RLEE>LLEE.  She had orthopnea x2 months and an episode of PND. She was taking Lasix 20mg  with KCl tab every other day and HCTZ on the other days. She had ongoing paresthesias with bilateral feet pain/throbbing and numbness without claudication.  She admitted to being a  "salt-a-holic" and advised to keep salt under 2g/day.  She drank 1 coffee and 3 water cups (large metal cup) per day with recommendation for  total fluid under 2L daily.    04/17/20 Echo showed EF 60-65%, G2DD, nl PASP, RVSP 32.42mmHg, RAP 4mmHg (versus 3 mmHg per IVC measurements).    When seen 04/30/2020, she denied further episodes of chest discomfort. She had a couple of episodes of tingling of the left side of her chest when doing chores around the house, lasting 2 to 3 minutes and resolving with hydrating.  She had ongoing DOE, preventing her from her work out.  She was wearing compression stockings for her ongonig LEE.  She had ongoing bilateral LE parathesias. She was using 2 pillows per night. She initially had increased urine output on HCTZ that dissipated with time.  She never had increased urine output on Lasix.   Wt had increased 1 pound from that of her previous weight. BP 140/90.  After ensuring stable renal function, recommendation was to increase to Lasix 20 mg daily.  If blood pressure was stable on this dose, she would then transition off of her HCTZ with close monitoring of blood pressure.   On 05/04/2020, she reported that, despite taking Lasix 20 mg daily with her 2 potassium pills, she was not noticing an increase in urine output.  She was wondering if she should increase her Lasix to 20 mg twice daily or also increase her HCTZ.  She noted "not much" left ankle/foot edema. She also had continued cough.    She started taking her blood pressure 9/23 and noted SBP 120-140 with DBP 80-90.  She was weighing herself with weight 278, suspecting she had lost some weight since the increase diuresis.  Given improvement in symptoms and decrease in weight, and following repeat BMET, recommendation was to stay at current Lasix 20 mg daily.  She reported ongoing cough.   Today, 05/21/2020, she denies further episodes of chest discomfort. She has not had any further episodes of chest tingling. She reports ongoing DOE but no SOB at rest. She feels as if her lower extremity edema has improved from previous visits, though still chronically  asymmetric as above. Lower extremity edema improves with compression stockings and elevation. She is still using 2 pillows per night; however, she has noticed she can lay down without as significant of symptoms. She states that her chest feels more open with 2 pillows. She has discontinued HCTZ with SBP 122 -150s and DBP 60s to 90s. On 10/3, she had BP 154/75, attributing this to work stress, as she was instructed to take a COVID-19 test due to left ear pain/ear infection. With increased dose of Lasix, weight has decreased from 286lbs  279 lbs. HR has fluctuated between 60s and 100s. She notes that she notices her heart racing with anxiety associated with work, as well as BP elevation with work anxiety. No feelings of irregular HR or palpitations.  She reports occasional presyncope or dizziness; however, she attributes this to her gabapentin and states this is the reason that she divides up her doses and takes 1 at night. In addition, she wonders if her current ear infection is contributing to her dizziness. No loss of consciousness or falls.  She reports medication compliance with Lasix 20 mg daily.  She is no longer taking HCTZ.  BP today 140/80 with weight 281 pounds. ----  Transcribed BP and weight below (taken at least 2 hours after AM medications): 9/23, 140/90, weight 286 9/24 120/80 9/25 130/75, weight 284 9/26 138/80 9/27 137/60, HR 65 bpm, weight 278 9/28 139/61, HR 104 bpm 9/29 142/95, weight 280 9/30 132/79 10/1 130/76, weight 279 10/two 125/84 10/three 154/75 (increased stressors due to work), 78 bpm, weight 281 pounds  Home Medications    Prior to Admission medications   Medication Sig Start Date End Date Taking? Authorizing Provider  albuterol (VENTOLIN HFA) 108 (90 Base) MCG/ACT inhaler Inhale 1-2 puffs into the lungs every 4 (four) hours as needed for up to 10 days for wheezing or shortness of breath. 10/12/18 03/07/28 Yes Betancourt, Aura Fey, NP  ARMOUR THYROID 30 MG tablet TAKE  ONE TABLET BY MOUTH EVERY MORNING BEFORE BREAKFAST 11/07/19  Yes Tonia Ghent, MD  butalbital-acetaminophen-caffeine (FIORICET) [phone] MG tablet Take 1 tablet by mouth as needed. 10/10/18  Yes Tonia Ghent, MD  cetirizine (ZYRTEC) 10 MG tablet Take 1 tablet (10 mg total) by mouth daily. 03/03/17  Yes Betancourt, Aura Fey, NP  Cholecalciferol (VITAMIN D3) 2000 UNITS TABS Take 6,000 Units by mouth daily. Reported on 10/29/2015   Yes [provider]  Cinnamon 500 MG capsule Take 500 mg by mouth daily.   Yes [provider]  diclofenac Sodium (VOLTAREN) 1 % GEL APPLY 2 GRAMS TOPICALLY FOUR TIMES A DAY AS NEEDED 02/08/20  Yes Tonia Ghent, MD  fluticasone [facility]) 50 MCG/ACT nasal spray Place 1 spray into both nostrils 2 (two) times daily. 10/12/18  Yes Betancourt, Aura Fey, NP  furosemide (LASIX) 20 MG tablet TAKE ONE TABLET BY MOUTH DAILY AS NEEDED FOR LEG SWELLING 03/16/20  Yes Gollan, Kathlene November, MD  gabapentin (NEURONTIN) 300 MG capsule Take 3 capsules (900 mg total) by mouth at bedtime. 01/22/20  Yes Tonia Ghent, MD  gemfibrozil (LOPID) 600 MG tablet Take 1 tablet (600 mg total) by mouth 2 (two) times daily before a meal. 09/19/19  Yes Tonia Ghent, MD  Halcinonide (HALOG) 0.1 % CREA Apply to affected area twice a day as needed. 09/19/19  Yes Tonia Ghent, MD  hydrochlorothiazide (MICROZIDE) 12.5 MG capsule TAKE 1 CAPSULE BY MOUTH DAILY AS NEEDED FOR LOWER EXTREMITY EDEMA. 09/19/19  Yes Tonia Ghent, MD  ibuprofen (ADVIL) 800 MG tablet Take 1 tablet (800 mg total) by mouth as needed (with food). 09/19/19  Yes Tonia Ghent, MD  montelukast (SINGULAIR) 10 MG tablet TAKE ONE TABLET BY MOUTH EVERY NIGHT AT BEDTIME 08/07/19  Yes Betancourt, Tina A, NP  Multiple Vitamin (MULTIVITAMIN) capsule Take 1 capsule by mouth daily.   Yes [provider]  Multiple Vitamins-Minerals (PRESERVISION AREDS) TABS Take 1 tablet by mouth daily. 09/14/18  Yes Tonia Ghent, MD    omeprazole (PRILOSEC) 40 MG capsule Take 1 capsule (40 mg total) by mouth at bedtime. 09/19/19  Yes Tonia Ghent, MD  Potassium 99 MG TABS Take 1-2 tablets (99-198 mg total) by mouth daily. 09/19/19  Yes Tonia Ghent,  MD  sodium chloride (OCEAN) 0.65 % SOLN nasal spray Place 2 sprays into both nostrils every 2 (two) hours while awake. 12/08/19 03/27/20 Yes Betancourt, Aura Fey, NP  SUMAtriptan (IMITREX) 20 MG/ACT nasal spray Place 1 spray (20 mg total) into the nose as needed. 12/17/15  Yes Lucille Passy, MD  SYMBICORT 160-4.5 MCG/ACT inhaler INHALE TWO PUFFS BY MOUTH TWICE A DAY 12/17/18  Yes Wilhelmina Mcardle, MD  TURMERIC CURCUMIN PO Take by mouth.   Yes [provider]  vitamin B-12 (CYANOCOBALAMIN) 1000 MCG tablet Take 1 tablet (1,000 mcg total) by mouth daily. 12/27/17  Yes Tonia Ghent, MD    Review of Systems    She denies chest pain, palpitations, n, v, syncope, PND, weight gain, or early satiety.  She denies chest tingling.  She reports ongoing dyspnea, chronic productive cough, 2 pillow orthopnea yet somewhat improved in breathing when laying flat, and bilateral edema. She notes occasional dizziness and a current ear infection.  All other systems reviewed and are otherwise negative except as noted above.  Physical Exam    VS:  BP 140/80 (BP Location: Left Arm, Patient Position: Sitting, Cuff Size: Large)   Pulse 80   Ht 5\' 7"  (1.702 m)   Wt 281 lb (127.5 kg)   SpO2 98%   BMI 44.01 kg/m  , BMI Body mass index is 44.01 kg/m. GEN: Well nourished, well developed, in no acute distress. HEENT: normal. Neck: Supple, no JVD, carotid bruits, or masses. Cardiac: RRR, no murmurs, rubs, or gallops. No clubbing, cyanosis.  Bilateral moderate to 1+  edema, RLEE>LLEE.  Radials/DP/PT 2+ and equal bilaterally.  Respiratory:  Respirations regular and unlabored, clear to auscultation bilaterally. GI: Soft, nontender, nondistended, BS + x 4. MS: no deformity or atrophy. Skin: warm and  dry, no rash. Neuro:  Strength and sensation are intact. Psych: Normal affect.  Accessory Clinical Findings    ECG personally reviewed by me today - Deferred - no acute changes.  VITALS Reviewed today   Temp Readings from Last 3 Encounters:  09/19/19 (!) 96.5 F (35.8 C) (Temporal)  08/04/19 97.8 F (36.6 C) (Tympanic)  07/21/19 (!) 96.9 F (36.1 C) (Oral)   BP Readings from Last 3 Encounters:  05/21/20 140/80  04/30/20 140/90  03/27/20 120/80   Pulse Readings from Last 3 Encounters:  05/21/20 80  04/30/20 83  03/27/20 75    Wt Readings from Last 3 Encounters:  05/21/20 281 lb (127.5 kg)  04/30/20 283 lb (128.4 kg)  03/27/20 282 lb (127.9 kg)     LABS  reviewed today   Lab Results  Component Value Date   WBC 8.2 09/15/2019   HGB 12.7 09/15/2019   HCT 38.4 09/15/2019   MCV 90.6 09/15/2019   PLT 313.0 09/15/2019   Lab Results  Component Value Date   CREATININE 0.83 05/11/2020   BUN 14 05/11/2020   NA 142 05/11/2020   K 4.2 05/11/2020   CL 104 05/11/2020   CO2 26 05/11/2020   Lab Results  Component Value Date   ALT 25 09/15/2019   AST 19 09/15/2019   ALKPHOS 69 09/15/2019   BILITOT 0.4 09/15/2019   Lab Results  Component Value Date   CHOL 160 09/15/2019   HDL 43.10 09/15/2019   LDLCALC 93 09/15/2019   LDLDIRECT 98.0 12/17/2016   TRIG 116.0 09/15/2019   CHOLHDL 4 09/15/2019    Lab Results  Component Value Date   HGBA1C 6.0 09/15/2019   Lab Results  Component Value Date   TSH 2.19 09/15/2019     STUDIES/PROCEDURES reviewed today   Echo 04/17/2020 1. Left ventricular ejection fraction, by estimation, is 60 to 65%. The  left ventricle has normal function. The left ventricle has no regional  wall motion abnormalities. Left ventricular diastolic parameters are  consistent with Grade II diastolic  dysfunction (pseudonormalization).  2. Right ventricular systolic function is normal. The right ventricular  size is normal. There is normal  pulmonary artery systolic pressure. The  estimated right ventricular systolic pressure is 95.2 mmHg.   Echo 03/2019 1. The left ventricle has normal systolic function with an ejection  fraction of 60-65%. The cavity size was normal. Left ventricular diastolic  Doppler parameters are consistent with impaired relaxation.  2. The right ventricle has normal systolic function. The cavity was  normal. There is no increase in right ventricular wall thickness. Unable  to estimate RVSP.   LHC per Dr. Rockey Situ notes in 2002 by Dr. Humphrey Rolls with nl cors.    Assessment & Plan    Diastolic dysfunction --Reports chronic cough for over 5 years with ongoing two-pillow orthopnea and progressive edema, as well as dyspnea. Orthopnea and LEE improved from previous visits. She uses her albuterol 1 time per week and Symbicort twice daily. 03/2020 echo with EF 60 to 65%, G2DD, RVSP 32.3 mmHg. Increased to Lasix 20 mg daily (versus every other day) with 2 potassium tablets daily. She has since discontinued HCTZ. Home SBP 122 - 154 with DBP 60-90 and wt 286lbs  281lbs.  Clinic BP 140/80 with wt 281lbs. Given elevated DBP and room in HR, start carvedilol 3.125 mg twice daily. Discussed with her that this is a BB, and therefore, this medication may exacerbate any underlying bronchitis. She will let us know how she tolerates it. Considered also was low dose CCB; however, this may exacerbate her existing LEE. Will trial low dose Coreg. She will send a MyChart message within 1 week to let me know how she is feeling / breathing on the BB. Defer any changes in diuretic. Sx have improved, as well as her weights. Most recent renal function as above with Cr 0.83 with BUN stable (baseline Cr 0.70-0.80). She will continue to monitor BP, HR, and weights and let us know if she stops noting progress in her volume status. Reassess BP and volume status (as well as how doing on BB) via MyChart messages and at RTC with further adjustments if  needed. She is keeping fluid under 2 L total daily and keep salt under 2 g total daily.  Chest pain, resolved --Reports no further episodes of chest pain as described previously.  Resolution noted of occasional tingling in her chest.  Risk factors for cardiac etiology include family history, personal history of smoking, secondhand smoke exposure.  EKG without acute changes.  Echo shows normal pump function but elevated RAP/RVSP with diuresis subsequently increased to Lasix 20mg  daily.  Will defer previously referenced coronary CT, given improvement in sx of CP and tingling with diuresis as above. If return of CP, could consider future coronary CT given her risk factors for cardiac etiology, and from a reassurance standpoint.  Will defer for now, however. Started on low-dose BB today with Coreg 3.125mg  for additional antianginal effect and BP support and with recommendation that she notifies the office if she cannot tolerate this from a breathing standpoint, given previous concern for underlying bronchitis. Also considered was CCB, though this may exacerbate LEE. Continue to work towards goal  BP as below. If needed, add Losartan at RTC to reach goal BP as below.   Hypertension, goal BP < 130/80 --BP elevated today and still not at goal. See above in HPI for transcribed BP, recorded over the last week. She has also periodically had elevated HR as above. Start Coreg 3.125mg  BID if breathing status allows, given past concern for underlying bronchitis. If she does not tolerate BB from a respiratory standpoint, consider Losartan or CCB in the future. Note that CCB could exacerbate LEE. We will also continue to monitor volume status and increase diuresis if needed in the future as renal function allows.   Sinusitis, recent  --Recent diagnosis. Continue to monitor sx. Tx per PCP.  Chronic cough --Chronic. Previous concern noted by primary cardiologist for underlying bronchitis. Previous CXR with mild bronchitic  changes. Seen by pulmonology with suspicion noted for GERD/LPR. No change with increased diuresis. As noted in the past, previous concern for chronic bronchitis. Currently completing tx for ear infection / sinusitis as above. If BB exacerbates cough or breathing status, consider Losartan for BP control or CCB (if this does not exacerbate LEE).   Intermittent dizziness --Attributes to ear infection and gabapentin. Continue to monitor, especially with addition of low dose BB. Reassess at RTC.   Medication changes: Start Coreg 3.125mg  BID.  Labs ordered: None. Studies / Imaging ordered: None. Future considerations: Reassess breathing status on BB, given concerns for bronchitis noted in the past. If cannot tolerate BB and renal function allows with ongoing elevated BP, start Losartan versus transition to CCB (though CCB may exacerbate LEE). If further racing HR or dizziness, consider Zio. With recurrent CP, could consider cardiac CT.  Disposition: Send Mychart message to notify of response to Coreg in ~ 1 week. RTC 3-4 weeks to see how doing on Coreg 3.125mg  and to reassess volume status and BP.  Arvil Chaco, PA-C

## 2020-05-21 ENCOUNTER — Ambulatory Visit (INDEPENDENT_AMBULATORY_CARE_PROVIDER_SITE_OTHER): Payer: PRIVATE HEALTH INSURANCE | Admitting: Physician Assistant

## 2020-05-21 ENCOUNTER — Other Ambulatory Visit: Payer: Self-pay

## 2020-05-21 ENCOUNTER — Encounter: Payer: Self-pay | Admitting: Physician Assistant

## 2020-05-21 VITALS — BP 140/80 | HR 80 | Ht 67.0 in | Wt 281.0 lb

## 2020-05-21 DIAGNOSIS — E785 Hyperlipidemia, unspecified: Secondary | ICD-10-CM

## 2020-05-21 DIAGNOSIS — R059 Cough, unspecified: Secondary | ICD-10-CM | POA: Diagnosis not present

## 2020-05-21 DIAGNOSIS — I5189 Other ill-defined heart diseases: Secondary | ICD-10-CM

## 2020-05-21 DIAGNOSIS — R0600 Dyspnea, unspecified: Secondary | ICD-10-CM

## 2020-05-21 DIAGNOSIS — I10 Essential (primary) hypertension: Secondary | ICD-10-CM

## 2020-05-21 DIAGNOSIS — Z6841 Body Mass Index (BMI) 40.0 and over, adult: Secondary | ICD-10-CM | POA: Diagnosis not present

## 2020-05-21 MED ORDER — CARVEDILOL 3.125 MG PO TABS: 3.1250 mg | ORAL_TABLET | Freq: Two times a day (BID) | ORAL | 5 refills | Status: DC

## 2020-05-21 NOTE — Patient Instructions (Signed)
Medication Instructions:  Your physician has recommended you make the following change in your medication:   START Carvedilol 3.125 mg twice daily. An Rx has been sent to your pharmacy.  *If you need a refill on your cardiac medications before your next appointment, please call your pharmacy*   Lab Work: None ordered If you have labs (blood work) drawn today and your tests are completely normal, you will receive your results only by: Marland Kitchen MyChart Message (if you have MyChart) OR . A paper copy in the mail If you have any lab test that is abnormal or we need to change your treatment, we will call you to review the results.   Testing/Procedures: None ordered   Follow-Up: At [facility], you and your health needs are our priority.  As part of our continuing mission to provide you with exceptional heart care, we have created designated Provider Care Teams.  These Care Teams include your primary Cardiologist (physician) and Advanced Practice Providers (APPs -  Physician Assistants and Nurse Practitioners) who all work together to provide you with the care you need, when you need it.  We recommend signing up for the patient portal called "MyChart".  Sign up information is provided on this After Visit Summary.  MyChart is used to connect with patients for Virtual Visits (Telemedicine).  Patients are able to view lab/test results, encounter notes, upcoming appointments, etc.  Non-urgent messages can be sent to your provider as well.   To learn more about what you can do with MyChart, go to [email or web address].    Your next appointment:   1-2 month(s)  The format for your next appointment:   In Person  Provider:   You may see Ida Rogue, MD or one of the following Advanced Practice Providers on your designated Care Team:    Murray Hodgkins, NP  Christell Faith, PA-C  Marrianne Mood, PA-C  Cadence Kathlen Mody, [place]    Other Instructions Please send a mychart message to Marrianne Mood, PA in one week to update her on how you are doing on the new medication.

## 2020-07-17 NOTE — Progress Notes (Signed)
Office Visit    Patient Name: [name] Date of Encounter: 07/24/2020  Primary Care Provider:  Tonia Ghent, MD Primary Cardiologist:  Ida Rogue, MD  Chief Complaint    Chief Complaint  Patient presents with  . other    2 month follow up. Meds reviewed by the pt. verbally. Pt. c/o feeling congested and fluid in left ear.     59 year old female with history of chronic cough (5 years), LHC 2002 with Dr. Humphrey Rolls with nl cors, and here today for 4 week follow-up on volume status and BP.  Past Medical History    Past Medical History:  Diagnosis Date  . Anxiety   . History of colon polyps   . History of eosinophilia    ESOPHAGEAL  . Hyperlipidemia   . Hypothyroidism   . Migraine   . Neuropathy   . Nocturia   . Pelvic prolapse   . Seasonal allergies   . Urgency of urination   . Vitamin D deficiency   . Wears glasses    Past Surgical History:  Procedure Laterality Date  . ABDOMINAL HYSTERECTOMY  2001   w/  Bladder Suspension  . ANTERIOR AND POSTERIOR REPAIR WITH SACROSPINOUS FIXATION N/A 07/24/2014   Procedure: SACROSPINOUS LIGAMENT SUSPENSION ;  Surgeon: Maisie Fus, MD;  Location: [facility];  Service: Gynecology;  Laterality: N/A;  . COLONOSCOPY W/ POLYPECTOMY  04-25-2014  . LAPAROSCOPIC CHOLECYSTECTOMY  2008  . VAGINAL HYSTERECTOMY N/A 07/24/2014   Procedure: RESECTION OF CERVIX ;  Surgeon: Maisie Fus, MD;  Location: [facility];  Service: Gynecology;  Laterality: N/A;    Allergies  Allergies  Allergen Reactions  . Codeine Nausea And Vomiting    History of Present Illness    [name] is a 59 y.o. female with PMH as above.   She has a past history of smoking (~36 years ago).  She does report a history of secondhand smoke exposure.  Her father smoked cigarettes for 12 years while she was a child and in the same house.  She also notes that her sister currently smokes and sometimes does so around her.  She  denies alcohol or illegal drug use.  She reports a family history of heart disease.  She is uncertain if her mother has heart disease; however, she states her father sees Dr. Rockey Situ.  She has a history of chronic cough.  She first presented to Dr. Rockey Situ 03/08/2019 for chronic cough and lower extremity edema, at which time she reported a cough for 4 years.  It was noted she underwent heart catheterization in 2002 by an outside cardiologist (Dr. Humphrey Rolls) for abnormal EKG and with nl cors. At that time, she noted her cough was worse without Symbicort and better after doubling her dose.  She was taking singular and albuterol.  She noted frequent naps during the day.  She also noted paresthesias in bilateral feet, occurring at night, and for which she was on Neurontin.  She stated her cough is dry and once in a while would produce white sputum.  She had not tried antibiotics.  EKG at that time was without significant ST or T wave changes.  She received her COVID-19 vaccination with [place] on 3/20 03/06/2020 and 12/03/2019.  When seen in clinic 03/2020, she noted progressive productive cough x5 years, LEE, and fatigue.  She had reduced exercise tolerance and DOE with short distances. She had 1 episode of CP in the heat  /  humidity that was pleuritic and substernal, rated 5/10 in severity with associated lightheadedness and that improved with 92m rest. LEE was exacerbated by heat and improved with elevation and compression with chronic RLEE>LLEE.  She had orthopnea x2 months and an episode of PND. She was taking Lasix 20mg  with KCl tab every other day and HCTZ on the other days. She had ongoing paresthesias with bilateral feet.  She admitted to being a  "salt-a-holic." She drank 1 coffee and 3 water cups (large metal cup) per day. 04/17/20 Echo showed EF 60-65%, G2DD, nl PASP, RVSP 32.64mmHg, RAP 59mmHg (versus 3 mmHg per IVC measurements).  Seen 04/30/2020 with atypical CP/ left sided chest tingling with chores around the  house, lasting 2 to 3 minutes and resolving with hydrating.  She had ongoing DOE, preventing her from her work out.  She was wearing compression stockings for her ongonig LEE with ongoing bilateral LE parathesias. She was using 2 pillows per night. She initially had increased urine output on HCTZ that dissipated with time. HCTZ was alternated with lasix daily. Wt had increased 1 pound from that of her previous weight. BP 140/90.  Diuresis increased to Lasix 20 mg daily.  On 05/04/2020, she reported that she was not noticing increased output but LEE improved.  SBP 120-140 with DBP 80-90.  Wt 278lbs.  Given improvement in symptoms and decrease in weight, she continued on Lasix 20 mg daily and HCTZ discontinued.    Seen 05/21/2020 without further CP but ongoing DOE. LEE still improved. Home SBP 122 -150s and DBP 60s to 90s. Since increasing her Lasix to daily dosing, weight decreased from 286lbs  279 lbs. HR  60s -100s. Tachypalpitation and elevated BP were reported but associated with anxiety at work. She noted occasional presyncope /dizziness; attributed to her gabapentin and reportedly the reason for divided dosing. She had an ear infection and wondered if this contributed to her dizziness. Clinic BP 140/80 with weight 281 pounds.  Recommendation was to start carvedilol 3.125 mg twice daily.  If unable to tolerate a beta-blocker due to her breathing status, it was noted that losartan versus calcium channel blocker could be utilized in the future, though CCB might exacerbate her lower extremity edema.   Today, 07/24/20, she returns to clinic and notes ongoing cough, which is now productive with yellow to green sputum. She feels as if she has a sinus infection. She feels as if there is fluid in her ear but has not yet been to an ENT. Her dizziness has progressed wit an episode of recent near syncope over the weekend (12/5), at which time she fell into the rose bushes and scraped her hands. She reports that her vision  went black and she could hear her husband's voice but could not respond. She did not lose consciousness. She reports a recent LLE rash, which she gets each year and attributes to allergies. She is taking a corticosteroid cream for this. Her dizziness has progressed from previous visits and occurs all the time. She states she is afraid to move her head too quickly. She reports vague chest pain, lasting only seconds, and usually with coughing. No increase in DOE from that of baseline. Her LEE is still improved and minimal when compared with previous visits. Her feet still hurt, attributed to neuropathy. She reports stable 2 pillow orthopnea. She snores and does report daytime fatigue but no AM HA. Her wt has increased, attributed to her sedentary lifestyle, as she is not walking as much as  before but intends to do so soon.   Home Medications    Prior to Admission medications   Medication Sig Start Date End Date Taking? Authorizing Provider  albuterol (VENTOLIN HFA) 108 (90 Base) MCG/ACT inhaler Inhale 1-2 puffs into the lungs every 4 (four) hours as needed for up to 10 days for wheezing or shortness of breath. 10/12/18 03/07/28 Yes Betancourt, Aura Fey, NP  ARMOUR THYROID 30 MG tablet TAKE ONE TABLET BY MOUTH EVERY MORNING BEFORE BREAKFAST 11/07/19  Yes Tonia Ghent, MD  butalbital-acetaminophen-caffeine (FIORICET) [phone] MG tablet Take 1 tablet by mouth as needed. 10/10/18  Yes Tonia Ghent, MD  cetirizine (ZYRTEC) 10 MG tablet Take 1 tablet (10 mg total) by mouth daily. 03/03/17  Yes Betancourt, Aura Fey, NP  Cholecalciferol (VITAMIN D3) 2000 UNITS TABS Take 6,000 Units by mouth daily. Reported on 10/29/2015   Yes [provider]  Cinnamon 500 MG capsule Take 500 mg by mouth daily.   Yes [provider]  diclofenac Sodium (VOLTAREN) 1 % GEL APPLY 2 GRAMS TOPICALLY FOUR TIMES A DAY AS NEEDED 02/08/20  Yes Tonia Ghent, MD  fluticasone [facility]) 50 MCG/ACT nasal spray Place 1 spray  into both nostrils 2 (two) times daily. 10/12/18  Yes Betancourt, Aura Fey, NP  furosemide (LASIX) 20 MG tablet TAKE ONE TABLET BY MOUTH DAILY AS NEEDED FOR LEG SWELLING 03/16/20  Yes Gollan, Kathlene November, MD  gabapentin (NEURONTIN) 300 MG capsule Take 3 capsules (900 mg total) by mouth at bedtime. 01/22/20  Yes Tonia Ghent, MD  gemfibrozil (LOPID) 600 MG tablet Take 1 tablet (600 mg total) by mouth 2 (two) times daily before a meal. 09/19/19  Yes Tonia Ghent, MD  Halcinonide (HALOG) 0.1 % CREA Apply to affected area twice a day as needed. 09/19/19  Yes Tonia Ghent, MD  hydrochlorothiazide (MICROZIDE) 12.5 MG capsule TAKE 1 CAPSULE BY MOUTH DAILY AS NEEDED FOR LOWER EXTREMITY EDEMA. 09/19/19  Yes Tonia Ghent, MD  ibuprofen (ADVIL) 800 MG tablet Take 1 tablet (800 mg total) by mouth as needed (with food). 09/19/19  Yes Tonia Ghent, MD  montelukast (SINGULAIR) 10 MG tablet TAKE ONE TABLET BY MOUTH EVERY NIGHT AT BEDTIME 08/07/19  Yes Betancourt, Tina A, NP  Multiple Vitamin (MULTIVITAMIN) capsule Take 1 capsule by mouth daily.   Yes [provider]  Multiple Vitamins-Minerals (PRESERVISION AREDS) TABS Take 1 tablet by mouth daily. 09/14/18  Yes Tonia Ghent, MD  omeprazole (PRILOSEC) 40 MG capsule Take 1 capsule (40 mg total) by mouth at bedtime. 09/19/19  Yes Tonia Ghent, MD  Potassium 99 MG TABS Take 1-2 tablets (99-198 mg total) by mouth daily. 09/19/19  Yes Tonia Ghent, MD  sodium chloride (OCEAN) 0.65 % SOLN nasal spray Place 2 sprays into both nostrils every 2 (two) hours while awake. 12/08/19 03/27/20 Yes Betancourt, Aura Fey, NP  SUMAtriptan (IMITREX) 20 MG/ACT nasal spray Place 1 spray (20 mg total) into the nose as needed. 12/17/15  Yes Lucille Passy, MD  SYMBICORT 160-4.5 MCG/ACT inhaler INHALE TWO PUFFS BY MOUTH TWICE A DAY 12/17/18  Yes Wilhelmina Mcardle, MD  TURMERIC CURCUMIN PO Take by mouth.   Yes [provider]  vitamin B-12 (CYANOCOBALAMIN) 1000 MCG tablet  Take 1 tablet (1,000 mcg total) by mouth daily. 12/27/17  Yes Tonia Ghent, MD    Review of Systems    She denies n, v, LHC, PND, or early satiety.   She  reports vague CP, lasting only seconds, and associated with needing to take a deeper breath/cough.  She is unclear if this is the feeling of palpitations.  This chest pain is described as different from that previously reported and new for her.  She reports an episode of near syncope, which resulted in her falling against the house and into a rose bush.  She reports blacking out but not losing consciousness.  She was able to hear her husband during that time.  She reports increasing dizziness without clear triggers.  She reports ongoing dyspnea, worsening productive cough, stable 2 pillow orthopnea.  She has improved bilateral edema.  She feels as if there is fluid in her ear.  She notes green/yellow sputum.  She has recently suffered from a left lower extremity rash and is using steroid cream.  She reports weight gain, attributed to sedentary lifestyle.  All other systems reviewed and are otherwise negative except as noted above.  Physical Exam    VS:  BP 130/80 (BP Location: Left Arm, Patient Position: Sitting, Cuff Size: Large)   Pulse 83   Ht 5\' 7"  (1.702 m)   Wt 287 lb 2 oz (130.2 kg)   SpO2 98%   BMI 44.97 kg/m  , BMI Body mass index is 44.97 kg/m. GEN: Well nourished, well developed, in no acute distress. HEENT: normal. Neck: Supple, no JVD, carotid bruits, or masses. Cardiac: RRR, no murmurs, rubs, or gallops. No clubbing, cyanosis.  Bilateral mild to moderate edema.  Radials/DP/PT 2+ and equal bilaterally.  Respiratory:  Respirations regular and unlabored, reduced left basilar breath sounds. GI: Soft, nontender, nondistended, BS + x 4. MS: no deformity or atrophy. Skin: warm and dry, no rash. Neuro:  Strength and sensation are intact. Psych: Normal affect.  Accessory Clinical Findings    ECG personally reviewed by me today  -NSR, 83 bpm, PACs- no acute changes.  VITALS Reviewed today   Temp Readings from Last 3 Encounters:  09/19/19 (!) 96.5 F (35.8 C) (Temporal)  08/04/19 97.8 F (36.6 C) (Tympanic)  07/21/19 (!) 96.9 F (36.1 C) (Oral)   BP Readings from Last 3 Encounters:  07/24/20 130/80  05/21/20 140/80  04/30/20 140/90   Pulse Readings from Last 3 Encounters:  07/24/20 83  05/21/20 80  04/30/20 83    Wt Readings from Last 3 Encounters:  07/24/20 287 lb 2 oz (130.2 kg)  05/21/20 281 lb (127.5 kg)  04/30/20 283 lb (128.4 kg)     LABS  reviewed today   Lab Results  Component Value Date   WBC 8.2 09/15/2019   HGB 12.7 09/15/2019   HCT 38.4 09/15/2019   MCV 90.6 09/15/2019   PLT 313.0 09/15/2019   Lab Results  Component Value Date   CREATININE 0.83 05/11/2020   BUN 14 05/11/2020   NA 142 05/11/2020   K 4.2 05/11/2020   CL 104 05/11/2020   CO2 26 05/11/2020   Lab Results  Component Value Date   ALT 25 09/15/2019   AST 19 09/15/2019   ALKPHOS 69 09/15/2019   BILITOT 0.4 09/15/2019   Lab Results  Component Value Date   CHOL 160 09/15/2019   HDL 43.10 09/15/2019   LDLCALC 93 09/15/2019   LDLDIRECT 98.0 12/17/2016   TRIG 116.0 09/15/2019   CHOLHDL 4 09/15/2019    Lab Results  Component Value Date   HGBA1C 6.0 09/15/2019   Lab Results  Component Value Date   TSH 2.19 09/15/2019     STUDIES/PROCEDURES reviewed  today   Echo 04/17/2020 1. Left ventricular ejection fraction, by estimation, is 60 to 65%. The  left ventricle has normal function. The left ventricle has no regional  wall motion abnormalities. Left ventricular diastolic parameters are  consistent with Grade II diastolic  dysfunction (pseudonormalization).  2. Right ventricular systolic function is normal. The right ventricular  size is normal. There is normal pulmonary artery systolic pressure. The  estimated right ventricular systolic pressure is 16.1 mmHg.   Echo 03/2019 1. The left ventricle  has normal systolic function with an ejection  fraction of 60-65%. The cavity size was normal. Left ventricular diastolic  Doppler parameters are consistent with impaired relaxation.  2. The right ventricle has normal systolic function. The cavity was  normal. There is no increase in right ventricular wall thickness. Unable  to estimate RVSP.   LHC per Dr. Rockey Situ notes in 2002 by Dr. Humphrey Rolls with nl cors.    Assessment & Plan    Chronic Diastolic dysfunction --Reports chronic cough, though now productive with yellow/green sputum. Unchanged 2-pillow orthopnea. LEE improved. 03/2020 echo EF 60 -65%, G2DD, RVSP 32.3 mmHg.  Euvolemic and well compensated on exam.  Suspect current cough as 2/2 infectious etiology, given the sputum and inner ear description.  Will order chest x-ray with further recommendations at that time. For now, continue current mediations with Lasix 20 mg daily and as needed HCTZ.  Continue potassium supplementation.. As below, and for ffurther DBP control, increased to carvedilol 6.25 mg twice daily.   Chest pain, atypical --New CP, described as different from that previously reported.  CP described as vague and lasting only seconds, and usually occurred at the time of fluttering/cough. Consider that this could be occurring at the same time as her PACs. Will complete a Zio XT monitor to assess if ectopy corresponds with pt triggered events. For additional antianginal effect and BP support, and given room in HR, increased to carvedilol 6.25 mg twice daily.     Near-Syncopal Event, initial evaluation Progressive dizziness / presyncope --Reports progressive dizziness and feeling of fluid in her ears. Recent near syncope as above in HPI. No carotid bruit on exam. Recent echo as above without significant valvular dz. Suspect that dizziness is 2/2 inner ear etiology with possible current respiratory infection with recommendation to follow-up with ENT. Given her recent near-syncope,  however, will complete a 2 week Zio XT monitor to rule out near syncope and dizziness 2/2 arrhythmia and evaluate the burden of PACs seen on EKG today.  In addition, we will check a CBC, TSH, and BMET.  Further recommendations, if indicated, at that time.  PACs --Noted on EKG today. She does report vague CP, lasting only seconds, which could possibly correspond with her PACs.  Will check a CBC to rule out electrolyte abnormalities, as well as a TSH and CBC.  In addition, will complete a 2 week Zio XT monitor to further assess the burden of this ectopy and evaluate if patient triggered events correspond with PACs.  Hypertension, goal BP < 130/80 --BP significantly improved from that of previous and 130/80.  Increased carvedilol to 6.25 mg twice daily for further diastolic support.  Reviewed recommendations for low salt and fluid intake.  Sinusitis, recent  --Consider that her sinusitis may be contributing to her symptoms as described above.  Of note, she has completed previous treatments for ear infection/sinusitis. Reports chronic cough now is productive with green and yellow drainage / sputum. Obtain CXR today to further evaluate reduced left  basilar breath sounds on exam.   Chronic cough --Chronic cough for over 5 years (~ 6y) and no with green and yellow sputum/drainage. Previous concern noted by primary cardiologist for underlying bronchitis. Previous CXR with mild bronchitic changes. Seen by pulmonology with suspicion noted for GERD/LPR. No change with increased diuresis.  She has completed several treatments for ear infection / sinusitis as above.  Medication changes: Increase to Coreg 6.25mg  BID.  Labs ordered: BMET, TSH, CBC. Studies / Imaging ordered: CXR, Zio XT. Disposition: RTC 3-4 weeks.  Arvil Chaco, PA-C

## 2020-07-18 ENCOUNTER — Other Ambulatory Visit: Payer: Self-pay | Admitting: Family Medicine

## 2020-07-19 ENCOUNTER — Other Ambulatory Visit: Payer: Self-pay | Admitting: Family Medicine

## 2020-07-19 NOTE — Telephone Encounter (Signed)
Pharmacy requests refill on: Gabapentin 100 mg   LAST REFILL: 01/22/2020 (Q-270, R-1) LAST OV: 09/19/2019 NEXT OV: Not Scheduled  PHARMACY: [place], [place]

## 2020-07-19 NOTE — Telephone Encounter (Signed)
Pharmacy requests refill on: Gabapentin 300 mg   LAST REFILL: 01/22/2020 (Q-270, R-1) LAST OV: 09/19/2019 NEXT OV: Not Scheduled  PHARMACY: [place], [place]

## 2020-07-20 NOTE — Telephone Encounter (Signed)
Please verify current use on both 300mg  and 100mg  tabs.  Thanks.

## 2020-07-20 NOTE — Telephone Encounter (Signed)
Please Advise

## 2020-07-22 NOTE — Telephone Encounter (Signed)
Please verify current use on both 300mg  and 100mg  tabs (what is her total dosing, how often is she taking, etc).  Thanks.

## 2020-07-23 NOTE — Telephone Encounter (Signed)
Sent. Thanks.   

## 2020-07-23 NOTE — Telephone Encounter (Signed)
Patient states that she takes one 100 mg tablet in the morning and three 300 mg tablets (900 mg total) at bedtime.

## 2020-07-23 NOTE — Telephone Encounter (Signed)
Patient states that she takes one 100 mg tablet every morning and three 300 mg tablets (900 mg total) at bedtime.

## 2020-07-24 ENCOUNTER — Ambulatory Visit
Admission: RE | Admit: 2020-07-24 | Discharge: 2020-07-24 | Disposition: A | Discharge status: Home / Self Care | Payer: No Typology Code available for payment source | Attending: Physician Assistant | Admitting: Physician Assistant

## 2020-07-24 ENCOUNTER — Ambulatory Visit
Admission: RE | Admit: 2020-07-24 | Discharge: 2020-07-24 | Disposition: A | Discharge status: Home / Self Care | Payer: No Typology Code available for payment source | Source: Ambulatory Visit | Attending: Physician Assistant | Admitting: Physician Assistant

## 2020-07-24 ENCOUNTER — Encounter: Payer: Self-pay | Admitting: Physician Assistant

## 2020-07-24 ENCOUNTER — Ambulatory Visit: Payer: PRIVATE HEALTH INSURANCE | Admitting: Physician Assistant

## 2020-07-24 ENCOUNTER — Other Ambulatory Visit: Payer: Self-pay

## 2020-07-24 ENCOUNTER — Other Ambulatory Visit
Admission: RE | Admit: 2020-07-24 | Discharge: 2020-07-24 | Disposition: A | Discharge status: Home / Self Care | Payer: No Typology Code available for payment source | Source: Home / Self Care | Attending: Physician Assistant | Admitting: Physician Assistant

## 2020-07-24 ENCOUNTER — Ambulatory Visit (INDEPENDENT_AMBULATORY_CARE_PROVIDER_SITE_OTHER): Payer: PRIVATE HEALTH INSURANCE

## 2020-07-24 VITALS — BP 130/80 | HR 83 | Ht 67.0 in | Wt 287.1 lb

## 2020-07-24 DIAGNOSIS — R55 Syncope and collapse: Secondary | ICD-10-CM

## 2020-07-24 DIAGNOSIS — R059 Cough, unspecified: Secondary | ICD-10-CM

## 2020-07-24 DIAGNOSIS — I5189 Other ill-defined heart diseases: Secondary | ICD-10-CM

## 2020-07-24 DIAGNOSIS — R0600 Dyspnea, unspecified: Secondary | ICD-10-CM | POA: Insufficient documentation

## 2020-07-24 DIAGNOSIS — R0789 Other chest pain: Secondary | ICD-10-CM | POA: Diagnosis not present

## 2020-07-24 DIAGNOSIS — I491 Atrial premature depolarization: Secondary | ICD-10-CM

## 2020-07-24 DIAGNOSIS — I10 Essential (primary) hypertension: Secondary | ICD-10-CM | POA: Diagnosis not present

## 2020-07-24 DIAGNOSIS — E038 Other specified hypothyroidism: Secondary | ICD-10-CM

## 2020-07-24 DIAGNOSIS — E785 Hyperlipidemia, unspecified: Secondary | ICD-10-CM

## 2020-07-24 LAB — CBC
HCT: 37.4 % (ref 36.0–46.0)
Hemoglobin: 12.2 g/dL (ref 12.0–15.0)
MCH: 29.5 pg (ref 26.0–34.0)
MCHC: 32.6 g/dL (ref 30.0–36.0)
MCV: 90.6 fL (ref 80.0–100.0)
Platelets: 271 10*3/uL (ref 150–400)
RBC: 4.13 MIL/uL (ref 3.87–5.11)
RDW: 14.6 % (ref 11.5–15.5)
WBC: 7.4 10*3/uL (ref 4.0–10.5)
nRBC: 0 % (ref 0.0–0.2)

## 2020-07-24 LAB — BASIC METABOLIC PANEL
Anion gap: 10 (ref 5–15)
BUN: 15 mg/dL (ref 6–20)
CO2: 26 mmol/L (ref 22–32)
Calcium: 9.3 mg/dL (ref 8.9–10.3)
Chloride: 105 mmol/L (ref 98–111)
Creatinine, Ser: 0.68 mg/dL (ref 0.44–1.00)
GFR, Estimated: >60 mL/min (ref >60)
Glucose, Bld: 109 mg/dL — ABNORMAL HIGH (ref 70–99)
Potassium: 4.4 mmol/L (ref 3.5–5.1)
Sodium: 141 mmol/L (ref 135–145)

## 2020-07-24 LAB — TSH: TSH: 1.959 u[IU]/mL (ref 0.350–4.500)

## 2020-07-24 MED ORDER — CARVEDILOL 6.25 MG PO TABS: 6.2500 mg | ORAL_TABLET | Freq: Two times a day (BID) | ORAL | 3 refills | Status: DC

## 2020-07-24 NOTE — Patient Instructions (Signed)
Medication Instructions:   Your physician has recommended you make the following change in your medication:   INCREASE Carvedilol dose to 6.25mg  TWICE daily - An Rx was sent to your pharmacy  *If you need a refill on your cardiac medications before your next appointment, please call your pharmacy*   Lab Work:  Your physician recommends that you have lab work TODAY: Bmet, TSH, CBC  If you have labs (blood work) drawn today and your tests are completely normal, you will receive your results only by: Marland Kitchen MyChart Message (if you have MyChart) OR . A paper copy in the mail If you have any lab test that is abnormal or we need to change your treatment, we will call you to review the results.   Testing/Procedures:  We have ordered a Chest X-Ray to be done today:  -  Please go to the [facility]. You will check in at the front desk to the right as you walk into the atrium.    Your physician has recommended that you wear a Zio monitor XT for 2 weeks. This monitor is a medical device that records the heart's electrical activity. Doctors most often use these monitors to diagnose arrhythmias. Arrhythmias are problems with the speed or rhythm of the heartbeat. The monitor is a small device applied to your chest. You can wear one while you do your normal daily activities. While wearing this monitor if you have any symptoms to push the button and record what you felt. Once you have worn this monitor for the period of time provider prescribed (Usually 14 days), you will return the monitor device in the postage paid box. Once it is returned they will download the data collected and provide [place] with a report which the provider will then review and we will call you with those results. Important tips:  1. Avoid showering during the first 24 hours of wearing the monitor. 2. Avoid excessive sweating to help maximize wear time. 3. Do not submerge the device, no hot tubs, and no swimming pools. 4. Keep any  lotions or oils away from the patch. 5. After 24 hours you may shower with the patch on. Take brief showers with your back facing the shower head.  6. Do not remove patch once it has been placed because that will interrupt data and decrease adhesive wear time. 7. Push the button when you have any symptoms and write down what you were feeling. 8. Once you have completed wearing your monitor, remove and place into box which has postage paid and place in your outgoing mailbox.  9. If for some reason you have misplaced your box then call our office and we can provide another box and/or mail it off for you.         Follow-Up: At [facility], you and your health needs are our priority.  As part of our continuing mission to provide you with exceptional heart care, we have created designated Provider Care Teams.  These Care Teams include your primary Cardiologist (physician) and Advanced Practice Providers (APPs -  Physician Assistants and Nurse Practitioners) who all work together to provide you with the care you need, when you need it.  We recommend signing up for the patient portal called "MyChart".  Sign up information is provided on this After Visit Summary.  MyChart is used to connect with patients for Virtual Visits (Telemedicine).  Patients are able to view lab/test results, encounter notes, upcoming appointments, etc.  Non-urgent messages can  be sent to your provider as well.   To learn more about what you can do with MyChart, go to [email or web address].    Your next appointment:   3-4 week(s)  The format for your next appointment:   In Person  Provider:   You may see Ida Rogue, MD or one of the following Advanced Practice Providers on your designated Care Team:    Murray Hodgkins, NP  Christell Faith, PA-C  Marrianne Mood, PA-C  Cadence Worthing, [place]  Laurann Montana, NP

## 2020-07-25 ENCOUNTER — Encounter: Payer: Self-pay | Admitting: Family Medicine

## 2020-07-25 MED ORDER — BUDESONIDE-FORMOTEROL FUMARATE 160-4.5 MCG/ACT IN AERO
INHALATION_SPRAY | RESPIRATORY_TRACT | 0 refills | Status: DC
Start: 2020-07-25 — End: 2020-08-27

## 2020-08-24 ENCOUNTER — Ambulatory Visit: Payer: PRIVATE HEALTH INSURANCE | Admitting: Cardiovascular Disease

## 2020-08-26 ENCOUNTER — Other Ambulatory Visit: Payer: Self-pay | Admitting: Family Medicine

## 2020-09-14 ENCOUNTER — Encounter: Payer: Self-pay | Admitting: Family Medicine

## 2020-09-17 NOTE — Progress Notes (Unsigned)
Cardiology Office Note  Date:  09/18/2020   ID:  [name], [name] [birth year], MRN [id number]  PCP:  Tonia Ghent, MD   Chief Complaint  Patient presents with  . Other    4 wk f/u zio no complaints today. Meds reviewed verbally with pt.    HPI:  [name] is a 60 year old woman with history of Morbid obesity Chronic cough, 4 years  Heart catheterization 2002 with Dr. Humphrey Rolls For abn ekg, tried nuclear stress, ) Who presents for f/u of his cough and leg edema, SOB with walking  Echo 03/2020, discussed 1. Left ventricular ejection fraction, by estimation, is 60 to 65%. The  left ventricle has normal function. The left ventricle has no regional  wall motion abnormalities. Left ventricular diastolic parameters are  consistent with Grade II diastolic  dysfunction (pseudonormalization).  2. Right ventricular systolic function is normal. The right ventricular  size is normal. There is normal pulmonary artery systolic pressure. The  estimated right ventricular systolic pressure is 29.5 mmHg.    Continues to have bronchospastic cough at times Thinks her weight is a problem Under some walking, sometimes has to stop secondary to shortness of breath She was having some left upper chest pain with walking, this seems to have resolved  Reviewed event monitor with her, approximately 2 episodes a day or very short runs of narrow complex tachycardia 4 beats up to 16 seconds on average, asymptomatic In a.m. On prior office visit with burning feet at night, on neurontin  EKG personally reviewed by myself on todays visit Shows normal sinus rhythm rate 75 with no significant ST-T wave changes, rate 79 bpm  Records requested from 2002 and received, reviewed, normal coronary arteries   PMH:   has a past medical history of Anxiety, History of colon polyps, History of eosinophilia, Hyperlipidemia, Hypothyroidism, Migraine, Neuropathy, Nocturia, Pelvic prolapse, Seasonal allergies,  Urgency of urination, Vitamin D deficiency, and Wears glasses.  PSH:    Past Surgical History:  Procedure Laterality Date  . ABDOMINAL HYSTERECTOMY  2001   w/  Bladder Suspension  . ANTERIOR AND POSTERIOR REPAIR WITH SACROSPINOUS FIXATION N/A 07/24/2014   Procedure: SACROSPINOUS LIGAMENT SUSPENSION ;  Surgeon: Maisie Fus, MD;  Location: [facility];  Service: Gynecology;  Laterality: N/A;  . COLONOSCOPY W/ POLYPECTOMY  04-25-2014  . LAPAROSCOPIC CHOLECYSTECTOMY  2008  . VAGINAL HYSTERECTOMY N/A 07/24/2014   Procedure: RESECTION OF CERVIX ;  Surgeon: Maisie Fus, MD;  Location: [facility];  Service: Gynecology;  Laterality: N/A;    Current Outpatient Medications  Medication Sig Dispense Refill  . albuterol (VENTOLIN HFA) 108 (90 Base) MCG/ACT inhaler Inhale 1-2 puffs into the lungs every 4 (four) hours as needed for up to 10 days for wheezing or shortness of breath. 18 each 0  . ARMOUR THYROID 30 MG tablet TAKE ONE TABLET BY MOUTH EVERY MORNING BEFORE BREAKFAST 90 tablet 3  . budesonide-formoterol (SYMBICORT) 160-4.5 MCG/ACT inhaler INHALE TWO PUFFS BY MOUTH TWICE A DAY 10.2 g 0  . butalbital-acetaminophen-caffeine (FIORICET) 50-325-40 MG tablet Take 1 tablet by mouth as needed. 30 tablet 0  . carvedilol (COREG) 6.25 MG tablet Take 1 tablet (6.25 mg total) by mouth 2 (two) times daily with a meal. 180 tablet 3  . cetirizine (ZYRTEC) 10 MG tablet Take 1 tablet (10 mg total) by mouth daily. 30 tablet 11  . Cholecalciferol (VITAMIN D3) 2000 UNITS TABS Take 6,000 Units by mouth daily. Reported on 10/29/2015    .  Chromium-Cinnamon (CINNAMON PLUS CHROMIUM) [phone] MCG-MG CAPS     . diclofenac Sodium (VOLTAREN) 1 % GEL APPLY 2 GRAMS TOPICALLY FOUR TIMES A DAY AS NEEDED 100 g 2  . fluticasone (FLONASE) 50 MCG/ACT nasal spray Place 1 spray into both nostrils 2 (two) times daily. 48 g 3  . furosemide (LASIX) 20 MG tablet TAKE ONE TABLET BY MOUTH DAILY 90 tablet 3  .  gabapentin (NEURONTIN) 100 MG capsule Take 1 capsule (100 mg total) by mouth in the morning. 90 capsule 1  . gabapentin (NEURONTIN) 300 MG capsule TAKE THREE CAPSULES BY MOUTH AT BEDTIME 270 capsule 1  . gemfibrozil (LOPID) 600 MG tablet Take 1 tablet (600 mg total) by mouth 2 (two) times daily before a meal. 180 tablet 3  . Halcinonide (HALOG) 0.1 % CREA Apply to affected area twice a day as needed.    . hydrochlorothiazide (MICROZIDE) 12.5 MG capsule TAKE 1 CAPSULE BY MOUTH DAILY AS NEEDED FOR LOWER EXTREMITY EDEMA. 90 capsule 3  . ibuprofen (ADVIL) 800 MG tablet Take 1 tablet (800 mg total) by mouth as needed (with food). 90 tablet 3  . Lutein 20 MG CAPS Take by mouth.    . montelukast (SINGULAIR) 10 MG tablet TAKE ONE TABLET BY MOUTH EVERY NIGHT AT BEDTIME 90 tablet 2  . Multiple Vitamin (MULTIVITAMIN) capsule Take 1 capsule by mouth daily.    Marland Kitchen omeprazole (PRILOSEC) 40 MG capsule Take 1 capsule (40 mg total) by mouth at bedtime. 90 capsule 3  . Potassium 99 MG TABS Take 1-2 tablets (99-198 mg total) by mouth daily.    . sodium chloride (OCEAN) 0.65 % SOLN nasal spray Place 2 sprays into both nostrils every 2 (two) hours while awake.  0  . SUMAtriptan (IMITREX) 20 MG/ACT nasal spray Place 1 spray (20 mg total) into the nose as needed. 24 Inhaler 0  . Turmeric Curcumin 500 MG CAPS daily.     . TURMERIC CURCUMIN PO Take by mouth.    . vitamin B-12 (CYANOCOBALAMIN) 1000 MCG tablet Take 1 tablet (1,000 mcg total) by mouth daily.     No current facility-administered medications for this visit.     Allergies:   Codeine   Social History:  The patient  reports that she quit smoking about 38 years ago. Her smoking use included cigarettes. She has a 1.00 pack-year smoking history. She has never used smokeless tobacco. She reports that she does not drink alcohol and does not use drugs.   Family History:   family history includes Breast cancer (age of onset: 64) in her sister; Hyperlipidemia in her  mother.    Review of Systems: Review of Systems  Constitutional: Negative.   HENT: Negative.   Respiratory: Positive for shortness of breath.   Cardiovascular: Negative.   Gastrointestinal: Negative.   Musculoskeletal: Negative.   Neurological: Negative.   Psychiatric/Behavioral: Negative.   All other systems reviewed and are negative.   PHYSICAL EXAM: VS:  BP 140/90 (BP Location: Left Arm, Patient Position: Sitting, Cuff Size: Large)   Pulse 75   Ht 5\' 6"  (1.676 m)   Wt 286 lb 6 oz (129.9 kg)   SpO2 97%   BMI 46.22 kg/m  , BMI Body mass index is 46.22 kg/m. Constitutional:  oriented to person, place, and time. No distress.  HENT:  Head: Grossly normal Eyes:  no discharge. No scleral icterus.  Neck: No JVD, no carotid bruits  Cardiovascular: Regular rate and rhythm, no murmurs appreciated Pulmonary/Chest: Clear to  auscultation bilaterally, no wheezes or rails Abdominal: Soft.  no distension.  no tenderness.  Musculoskeletal: Normal range of motion Neurological:  normal muscle tone. Coordination normal. No atrophy Skin: Skin warm and dry Psychiatric: normal affect, pleasant   Recent Labs: 07/24/2020: BUN 15; Creatinine, Ser 0.68; Hemoglobin 12.2; Platelets 271; Potassium 4.4; Sodium 141; TSH 1.959    Lipid Panel Lab Results  Component Value Date   CHOL 160 09/15/2019   HDL 43.10 09/15/2019   LDLCALC 93 09/15/2019   TRIG 116.0 09/15/2019      Wt Readings from Last 3 Encounters:  09/18/20 286 lb 6 oz (129.9 kg)  07/24/20 287 lb 2 oz (130.2 kg)  05/21/20 281 lb (127.5 kg)      ASSESSMENT AND PLAN:  Problem List Items Addressed This Visit      Cardiology Problems   Hyperlipidemia   Essential hypertension - Primary   Relevant Orders   EKG 12-Lead    Other Visit Diagnoses    Near syncope       Relevant Orders   EKG 12-Lead   Atypical chest pain       Relevant Orders   EKG 12-Lead   PAC (premature atrial contraction)       BMI 40.0-44.9, adult  (HCC)         Chronic cough Noncardiac in nature, managed by primary care  Shortness of breath Normal echocardiogram, normal right heart pressures We did discuss CT coronary calcium scoring for stress testing to rule out ischemia Low risk factors, no significant diabetes, cholesterol reasonable, no smoking history, will hold off on ischemic work-up at this time at her request  Lower extremity edema Stable with Lasix, also has HCTZ as needed Likely exacerbated by weight  Hypertension Blood pressure mildly elevated on today's visit, recommend she monitor blood pressure at home and call us with numbers if it runs high   Total encounter time more than 25 minutes  Greater than 50% was spent in counseling and coordination of care with the patient   Signed, Esmond Plants, M.D., Ph.D. [place], [place]

## 2020-09-18 ENCOUNTER — Encounter: Payer: Self-pay | Admitting: Cardiovascular Disease

## 2020-09-18 ENCOUNTER — Other Ambulatory Visit: Payer: Self-pay

## 2020-09-18 ENCOUNTER — Ambulatory Visit: Payer: PRIVATE HEALTH INSURANCE | Admitting: Cardiovascular Disease

## 2020-09-18 VITALS — BP 140/90 | HR 75 | Ht 66.0 in | Wt 286.4 lb

## 2020-09-18 DIAGNOSIS — I491 Atrial premature depolarization: Secondary | ICD-10-CM | POA: Diagnosis not present

## 2020-09-18 DIAGNOSIS — E785 Hyperlipidemia, unspecified: Secondary | ICD-10-CM

## 2020-09-18 DIAGNOSIS — R55 Syncope and collapse: Secondary | ICD-10-CM | POA: Diagnosis not present

## 2020-09-18 DIAGNOSIS — Z6841 Body Mass Index (BMI) 40.0 and over, adult: Secondary | ICD-10-CM

## 2020-09-18 DIAGNOSIS — R0789 Other chest pain: Secondary | ICD-10-CM | POA: Diagnosis not present

## 2020-09-18 DIAGNOSIS — I10 Essential (primary) hypertension: Secondary | ICD-10-CM

## 2020-09-18 NOTE — Patient Instructions (Addendum)
Medication Instructions:  No changes  Lab work: No new labs needed  Testing/Procedures: No new testing needed   Follow-Up: At [facility], you and your health needs are our priority.  As part of our continuing mission to provide you with exceptional heart care, we have created designated Provider Care Teams.  These Care Teams include your primary Cardiologist (physician) and Advanced Practice Providers (APPs -  Physician Assistants and Nurse Practitioners) who all work together to provide you with the care you need, when you need it.  . You will need a follow up appointment in 12 months  . Providers on your designated Care Team:   . Murray Hodgkins, NP . Christell Faith, PA-C . Marrianne Mood, PA-C  Any Other Special Instructions Will Be Listed Below (If Applicable).  COVID-19 Vaccine Information can be found at: ShippingScam.co.uk For questions related to vaccine distribution or appointments, please email [email or web address] or call 3462048009.   Please monitor blood pressures and keep a log of your readings.  You can upload these number sin 2-3  weeks with BP readings to your MyChart account for review.  How to use a home blood pressure monitor. . Be still. . Measure at the same time every day. It's important to take the readings at the same time each day, such as morning and evening. Take reading approximately 1 1/2 to 2 hours after BP medications.   AVOID these things for 30 minutes before checking your blood pressure:  Drinking caffeine.  Drinking alcohol.  Eating.  Smoking.  Exercising.   Five minutes before checking your blood pressure:  Pee.  Sit in a dining chair. Avoid sitting in a soft couch or armchair.  Be quiet. Do not talk.       Sit correctly. Sit with your back straight and supported (on a dining chair, rather than a sofa). Your feet should be flat on the floor and your legs should not  be crossed. Your arm should be supported on a flat surface (such as a table) with the upper arm at heart level. Make sure the bottom of the cuff is placed directly above the bend of the elbow.

## 2020-09-24 ENCOUNTER — Other Ambulatory Visit: Payer: Self-pay | Admitting: Family Medicine

## 2020-09-24 DIAGNOSIS — E785 Hyperlipidemia, unspecified: Secondary | ICD-10-CM

## 2020-09-24 DIAGNOSIS — E559 Vitamin D deficiency, unspecified: Secondary | ICD-10-CM

## 2020-09-24 DIAGNOSIS — R739 Hyperglycemia, unspecified: Secondary | ICD-10-CM

## 2020-09-25 ENCOUNTER — Encounter: Payer: Self-pay | Admitting: Family Medicine

## 2020-09-27 ENCOUNTER — Other Ambulatory Visit (INDEPENDENT_AMBULATORY_CARE_PROVIDER_SITE_OTHER): Payer: PRIVATE HEALTH INSURANCE

## 2020-09-27 ENCOUNTER — Other Ambulatory Visit: Payer: Self-pay

## 2020-09-27 DIAGNOSIS — E559 Vitamin D deficiency, unspecified: Secondary | ICD-10-CM | POA: Diagnosis not present

## 2020-09-27 DIAGNOSIS — E785 Hyperlipidemia, unspecified: Secondary | ICD-10-CM | POA: Diagnosis not present

## 2020-09-27 DIAGNOSIS — R739 Hyperglycemia, unspecified: Secondary | ICD-10-CM

## 2020-09-27 LAB — LIPID PANEL
Cholesterol: 160 mg/dL (ref 0–200)
HDL: 40.2 mg/dL (ref >39.00)
LDL Cholesterol: 93 mg/dL (ref 0–99)
NonHDL: 119.92
Total CHOL/HDL Ratio: 4
Triglycerides: 135 mg/dL (ref 0.0–149.0)
VLDL: 27 mg/dL (ref 0.0–40.0)

## 2020-09-27 LAB — HEMOGLOBIN A1C: Hgb A1c MFr Bld: 6 % (ref 4.6–6.5)

## 2020-09-27 LAB — HEPATIC FUNCTION PANEL
ALT: 38 U/L — ABNORMAL HIGH (ref 0–35)
AST: 26 U/L (ref 0–37)
Albumin: 4.3 g/dL (ref 3.5–5.2)
Alkaline Phosphatase: 83 U/L (ref 39–117)
Bilirubin, Direct: 0.1 mg/dL (ref 0.0–0.3)
Total Bilirubin: 0.4 mg/dL (ref 0.2–1.2)
Total Protein: 7.6 g/dL (ref 6.0–8.3)

## 2020-09-27 LAB — VITAMIN D 25 HYDROXY (VIT D DEFICIENCY, FRACTURES): VITD: 50.46 ng/mL (ref 30.00–100.00)

## 2020-10-01 ENCOUNTER — Other Ambulatory Visit: Payer: Self-pay

## 2020-10-01 ENCOUNTER — Ambulatory Visit (INDEPENDENT_AMBULATORY_CARE_PROVIDER_SITE_OTHER): Payer: PRIVATE HEALTH INSURANCE | Admitting: Family Medicine

## 2020-10-01 ENCOUNTER — Encounter: Payer: Self-pay | Admitting: Family Medicine

## 2020-10-01 VITALS — BP 126/82 | HR 79 | Temp 97.6°F | Ht 66.0 in | Wt 280.0 lb

## 2020-10-01 DIAGNOSIS — Z0000 Encounter for general adult medical examination without abnormal findings: Secondary | ICD-10-CM

## 2020-10-01 DIAGNOSIS — J302 Other seasonal allergic rhinitis: Secondary | ICD-10-CM

## 2020-10-01 DIAGNOSIS — Z23 Encounter for immunization: Secondary | ICD-10-CM

## 2020-10-01 DIAGNOSIS — R7989 Other specified abnormal findings of blood chemistry: Secondary | ICD-10-CM

## 2020-10-01 DIAGNOSIS — E039 Hypothyroidism, unspecified: Secondary | ICD-10-CM

## 2020-10-01 DIAGNOSIS — E785 Hyperlipidemia, unspecified: Secondary | ICD-10-CM

## 2020-10-01 DIAGNOSIS — Z7189 Other specified counseling: Secondary | ICD-10-CM

## 2020-10-01 DIAGNOSIS — G629 Polyneuropathy, unspecified: Secondary | ICD-10-CM

## 2020-10-01 DIAGNOSIS — R059 Cough, unspecified: Secondary | ICD-10-CM

## 2020-10-01 DIAGNOSIS — I10 Essential (primary) hypertension: Secondary | ICD-10-CM

## 2020-10-01 DIAGNOSIS — H6980 Other specified disorders of Eustachian tube, unspecified ear: Secondary | ICD-10-CM

## 2020-10-01 DIAGNOSIS — G43909 Migraine, unspecified, not intractable, without status migrainosus: Secondary | ICD-10-CM

## 2020-10-01 MED ORDER — CARVEDILOL 6.25 MG PO TABS: 6.2500 mg | ORAL_TABLET | Freq: Two times a day (BID) | ORAL | 3 refills | Status: DC

## 2020-10-01 MED ORDER — OMEPRAZOLE 40 MG PO CPDR
40.0000 mg | DELAYED_RELEASE_CAPSULE | Freq: Every day | ORAL | 3 refills | Status: DC
Start: 2020-10-01 — End: 2021-10-25

## 2020-10-01 MED ORDER — GABAPENTIN 300 MG PO CAPS
900.0000 mg | ORAL_CAPSULE | Freq: Every day | ORAL | 3 refills | Status: DC
Start: 2020-10-01 — End: 2021-10-25

## 2020-10-01 MED ORDER — GABAPENTIN 100 MG PO CAPS
100.0000 mg | ORAL_CAPSULE | Freq: Two times a day (BID) | ORAL | 3 refills | Status: DC | PRN
Start: 2020-10-01 — End: 2021-10-12

## 2020-10-01 MED ORDER — FUROSEMIDE 20 MG PO TABS
ORAL_TABLET | ORAL | 3 refills | Status: DC
Start: 2020-10-01 — End: 2021-10-25

## 2020-10-01 MED ORDER — ALBUTEROL SULFATE HFA 108 (90 BASE) MCG/ACT IN AERS: 1.0000 | INHALATION_SPRAY | RESPIRATORY_TRACT | 3 refills | Status: DC | PRN

## 2020-10-01 MED ORDER — MONTELUKAST SODIUM 10 MG PO TABS
10.0000 mg | ORAL_TABLET | Freq: Every day | ORAL | 3 refills | Status: DC
Start: 2020-10-01 — End: 2021-10-25

## 2020-10-01 MED ORDER — BUDESONIDE-FORMOTEROL FUMARATE 160-4.5 MCG/ACT IN AERO
INHALATION_SPRAY | RESPIRATORY_TRACT | 3 refills | Status: DC
Start: 2020-10-01 — End: 2021-10-25

## 2020-10-01 MED ORDER — HYDROCHLOROTHIAZIDE 12.5 MG PO CAPS
ORAL_CAPSULE | ORAL | 3 refills | Status: DC
Start: 2020-10-01 — End: 2021-10-25

## 2020-10-01 MED ORDER — GEMFIBROZIL 600 MG PO TABS
600.0000 mg | ORAL_TABLET | Freq: Two times a day (BID) | ORAL | 3 refills | Status: DC
Start: 2020-10-01 — End: 2021-10-25

## 2020-10-01 MED ORDER — DICLOFENAC SODIUM 1 % EX GEL
CUTANEOUS | 2 refills | Status: DC
Start: 2020-10-01 — End: 2021-10-25

## 2020-10-01 MED ORDER — ARMOUR THYROID 30 MG PO TABS
30.0000 mg | ORAL_TABLET | Freq: Every day | ORAL | 3 refills | Status: DC
Start: 2020-10-01 — End: 2021-10-25

## 2020-10-01 MED ORDER — FLUTICASONE PROPIONATE 50 MCG/ACT NA SUSP
1.0000 | Freq: Two times a day (BID) | NASAL | 3 refills | Status: DC
Start: 2020-10-01 — End: 2021-10-25

## 2020-10-01 NOTE — Patient Instructions (Addendum)
You can call for a mammogram at [facility] at [facility].  [place] pop your ears and use nasal saline. Keep using flonase.  Don't change your meds for now.  Take care.  Glad to see you. Update me as needed.

## 2020-10-01 NOTE — Progress Notes (Signed)
This visit occurred during the SARS-CoV-2 public health emergency.  Safety protocols were in place, including screening questions prior to the visit, additional usage of staff PPE, and extensive cleaning of exam room while observing appropriate contact time as indicated for disinfecting solutions.  CPE- See plan.  Routine anticipatory guidance given to patient.  See health maintenance.  The possibility exists that previously documented standard health maintenance information may have been brought forward from a previous encounter into this note.  If needed, that same information has been updated to reflect the current situation based on today's encounter.    Tetanus 2018 Flu 2022 PNA not due Shingles 2021 covid vaccine done Pap not due. S/p hysterectomy.  Mammogram 2020, d/w pt.  See avs.   DXA not due.  Diet and exercise d/w pt. She is working on both.  Living will d/w pt. Would have her husband designated if patient were incapacitated.  She is working from home and that is good for her.    She is caring for her sister who has cancer.  D/w pt.  Condolences offered.    Elevated Cholesterol: Using medications without problems: yes Muscle aches:  no Diet compliance: yes Exercise: yes  She has foot neuropathy at baseline, using gabapentin with some relief.  Cautions d/w pt.  She occ needs extra dose of gabapentin during the day.    Migraine. No recent imitrex need.  On gabapentin at baseline, that could be preventive.    Hypothyroidism.  No neck mass, lump, dysphagia.  TSH wnl.    Cough improved with symbicort.  No wheeze.  No SOB.  Walking up to 1.5 miles.    Hypertension:    Using medication without problems or lightheadedness: yes Chest pain with exertion:no Edema:no Short of breath:no Labs d/w pt.    L ear discomfort intermittently for the last week.  No fevers, no chills.  No R ear sx.    ALT mildly up.  D/w pt.  No jaundice.  No abd pain.    PMH and SH  reviewed  Meds, vitals, and allergies reviewed.   ROS: Per HPI.  Unless specifically indicated otherwise in HPI, the patient denies:  General: fever. Eyes: acute vision changes ENT: sore throat Cardiovascular: chest pain Respiratory: SOB GI: vomiting GU: dysuria Musculoskeletal: acute back pain Derm: acute rash Neuro: acute motor dysfunction Psych: worsening mood Endocrine: polydipsia Heme: bleeding Allergy: hayfever  GEN: nad, alert and oriented HEENT: ncat, L TM WNL on inspection but ETD noted.   NECK: supple w/o LA CV: rrr. PULM: ctab, no inc wob ABD: soft, +bs EXT: no edema SKIN: no acute rash

## 2020-10-03 DIAGNOSIS — H6990 Unspecified Eustachian tube disorder, unspecified ear: Secondary | ICD-10-CM | POA: Insufficient documentation

## 2020-10-03 DIAGNOSIS — G43909 Migraine, unspecified, not intractable, without status migrainosus: Secondary | ICD-10-CM | POA: Insufficient documentation

## 2020-10-03 DIAGNOSIS — H6980 Other specified disorders of Eustachian tube, unspecified ear: Secondary | ICD-10-CM | POA: Insufficient documentation

## 2020-10-03 DIAGNOSIS — R7989 Other specified abnormal findings of blood chemistry: Secondary | ICD-10-CM | POA: Insufficient documentation

## 2020-10-03 NOTE — Assessment & Plan Note (Signed)
No change in meds.  Continue carvedilol hydrochlorothiazide, etc.  Continue work on diet and exercise.  She will update me as needed.

## 2020-10-03 NOTE — Assessment & Plan Note (Signed)
Tetanus 2018 ?Flu 2022 ?PNA not due ?Shingles 2021 ?covid vaccine done ?Pap not due.  S/p hysterectomy.   ?Mammogram 2020, d/w pt.  See avs.   ?DXA not due.   ?Diet and exercise d/w pt.  She is working on both.   ?Living will d/w pt.  Would have her husband designated if patient were incapacitated.  ?

## 2020-10-03 NOTE — Assessment & Plan Note (Signed)
Gently perform Valsalva and update me as needed.  She agrees.

## 2020-10-03 NOTE — Assessment & Plan Note (Signed)
TSH normal.  Continue [place] Thyroid as is.

## 2020-10-03 NOTE — Assessment & Plan Note (Signed)
Benign abdominal exam.  No emergent or alarming symptoms.  Check liver ultrasound.  She agrees.

## 2020-10-03 NOTE — Assessment & Plan Note (Signed)
Continue gabapentin.  She will update me as needed.

## 2020-10-03 NOTE — Assessment & Plan Note (Signed)
Continue work on diet and exercise.  Continue gemfibrozil.  Labs discussed with patient.

## 2020-10-03 NOTE — Assessment & Plan Note (Signed)
No recent need for Imitrex.  Gabapentin may be helping as a prophylactic medication.

## 2020-10-03 NOTE — Assessment & Plan Note (Signed)
Improved with Symbicort.  No wheeze.  Lungs are clear.  Update me as needed.  Continue Symbicort.

## 2020-10-03 NOTE — Assessment & Plan Note (Signed)
Living will d/w pt. Would have her husband designated if patient were incapacitated. 

## 2020-10-05 ENCOUNTER — Encounter: Payer: Self-pay | Admitting: Family Medicine

## 2020-10-08 ENCOUNTER — Ambulatory Visit: Payer: PRIVATE HEALTH INSURANCE

## 2020-10-18 ENCOUNTER — Encounter: Payer: Self-pay | Admitting: Family Medicine

## 2020-11-20 ENCOUNTER — Encounter: Payer: Self-pay | Admitting: Family Medicine

## 2020-11-21 NOTE — Telephone Encounter (Addendum)
See phone note in her son's chart.  Prev addressed.

## 2020-11-21 NOTE — Telephone Encounter (Signed)
See phone note.  Prev addressed.

## 2021-01-08 ENCOUNTER — Telehealth: Payer: Self-pay | Admitting: Registered Nurse

## 2021-01-08 ENCOUNTER — Encounter: Payer: Self-pay | Admitting: Registered Nurse

## 2021-01-08 ENCOUNTER — Other Ambulatory Visit: Payer: Self-pay

## 2021-01-08 DIAGNOSIS — H9201 Otalgia, right ear: Secondary | ICD-10-CM

## 2021-01-08 DIAGNOSIS — J0101 Acute recurrent maxillary sinusitis: Secondary | ICD-10-CM

## 2021-01-08 DIAGNOSIS — H8110 Benign paroxysmal vertigo, unspecified ear: Secondary | ICD-10-CM

## 2021-01-08 MED ORDER — MECLIZINE HCL 25 MG PO TABS: 25.0000 mg | ORAL_TABLET | Freq: Four times a day (QID) | ORAL | 0 refills | Status: AC | PRN

## 2021-01-08 MED ORDER — PREDNISONE 10 MG PO TABS: ORAL_TABLET | ORAL | 0 refills | Status: AC

## 2021-01-08 MED ORDER — AMOXICILLIN-POT CLAVULANATE 875-125 MG PO TABS: 1.0000 | ORAL_TABLET | Freq: Two times a day (BID) | ORAL | 0 refills | Status: AC

## 2021-01-08 NOTE — Patient Instructions (Addendum)
Eustachian Tube Dysfunction  Eustachian tube dysfunction refers to a condition in which a blockage develops in the narrow passage that connects the middle ear to the back of the nose (eustachian tube). The eustachian tube regulates air pressure in the middle ear by letting air move between the ear and nose. It also helps to drain fluid from the middle ear space. Eustachian tube dysfunction can affect one or both ears. When the eustachian tube does not function properly, air pressure, fluid, or both can build up in the middle ear. What are the causes? This condition occurs when the eustachian tube becomes blocked or cannot open normally. Common causes of this condition include:  Ear infections.  Colds and other infections that affect the nose, mouth, and throat (upper respiratory tract).  Allergies.  Irritation from cigarette smoke.  Irritation from stomach acid coming up into the esophagus (gastroesophageal reflux). The esophagus is the tube that carries food from the mouth to the stomach.  Sudden changes in air pressure, such as from descending in an airplane or scuba diving.  Abnormal growths in the nose or throat, such as: ? Growths that line the nose (nasal polyps). ? Abnormal growth of cells (tumors). ? Enlarged tissue at the back of the throat (adenoids). What increases the risk? You are more likely to develop this condition if:  You smoke.  You are overweight.  You are a child who has: ? Certain birth defects of the mouth, such as cleft palate. ? Large tonsils or adenoids. What are the signs or symptoms? Common symptoms of this condition include:  A feeling of fullness in the ear.  Ear pain.  Clicking or popping noises in the ear.  Ringing in the ear.  Hearing loss.  Loss of balance.  Dizziness. Symptoms may get worse when the air pressure around you changes, such as when you travel to an area of high elevation, fly on an airplane, or go scuba diving. How is  this diagnosed? This condition may be diagnosed based on:  Your symptoms.  A physical exam of your ears, nose, and throat.  Tests, such as those that measure: ? The movement of your eardrum (tympanogram). ? Your hearing (audiometry). How is this treated? Treatment depends on the cause and severity of your condition.  In mild cases, you may relieve your symptoms by moving air into your ears. This is called "popping the ears."  In more severe cases, or if you have symptoms of fluid in your ears, treatment may include: ? Medicines to relieve congestion (decongestants). ? Medicines that treat allergies (antihistamines). ? Nasal sprays or ear drops that contain medicines that reduce swelling (steroids). ? A procedure to drain the fluid in your eardrum (myringotomy). In this procedure, a small tube is placed in the eardrum to:  Drain the fluid.  Restore the air in the middle ear space. ? A procedure to insert a balloon device through the nose to inflate the opening of the eustachian tube (balloon dilation). Follow these instructions at home: Lifestyle  Do not do any of the following until your health care provider approves: ? Travel to high altitudes. ? Fly in airplanes. ? Work in a pressurized cabin or room. ? Scuba dive.  Do not use any products that contain nicotine or tobacco, such as cigarettes and e-cigarettes. If you need help quitting, ask your health care provider.  Keep your ears dry. Wear fitted earplugs during showering and bathing. Dry your ears completely after. General instructions  Take over-the-counter   and prescription medicines only as told by your health care provider.  Use techniques to help pop your ears as recommended by your health care provider. These may include: ? Chewing gum. ? Yawning. ? Frequent, forceful swallowing. ? Closing your mouth, holding your nose closed, and gently blowing as if you are trying to blow air out of your nose.  Keep all  follow-up visits as told by your health care provider. This is important. Contact a health care provider if:  Your symptoms do not go away after treatment.  Your symptoms come back after treatment.  You are unable to pop your ears.  You have: ? A fever. ? Pain in your ear. ? Pain in your head or neck. ? Fluid draining from your ear.  Your hearing suddenly changes.  You become very dizzy.  You lose your balance. Summary  Eustachian tube dysfunction refers to a condition in which a blockage develops in the eustachian tube.  It can be caused by ear infections, allergies, inhaled irritants, or abnormal growths in the nose or throat.  Symptoms include ear pain, hearing loss, or ringing in the ears.  Mild cases are treated with maneuvers to unblock the ears, such as yawning or ear popping.  Severe cases are treated with medicines. Surgery may also be done (rare). This information is not intended to replace advice given to you by your health care provider. Make sure you discuss any questions you have with your health care provider. Document Revised: 11/24/2017 Document Reviewed: 11/24/2017 Elsevier Patient Education  2021 [place]. How to Perform a Sinus Rinse A sinus rinse is a home treatment that is used to rinse your sinuses with a sterile mixture of salt and water (saline solution). Sinuses are air-filled spaces in your skull behind the bones of your face and forehead that open into your nasal cavity. A sinus rinse can help to clear mucus, dirt, dust, or pollen from your nasal cavity. You may do a sinus rinse when you have a cold, a virus, nasal allergy symptoms, a sinus infection, or stuffiness in your nose or sinuses. Talk with your health care provider about whether a sinus rinse might help you. What are the risks? A sinus rinse is generally safe and effective. However, there are a few risks, which include:  A burning sensation in your sinuses. This may happen if you do  not make the saline solution as directed. Be sure to follow all directions when making the saline solution.  Nasal irritation.  Infection from contaminated water. This is rare, but possible. Do not do a sinus rinse if you have had ear or nasal surgery, ear infection, or blocked ears. Supplies needed:  Saline solution or powder.  Distilled or sterile water to mix with saline powder. ? You may use boiled and cooled tap water. Boil tap water for 5 minutes; cool until it is lukewarm. Use within 24 hours. ? Do not use regular tap water to mix with the saline solution.  Neti pot or nasal rinse bottle. These supplies release the saline solution into your nose and through your sinuses. Neti pots and nasal rinse bottles can be purchased at [employer], a health food store, or online. How to perform a sinus rinse 1. Wash your hands with soap and water. 2. Wash your device according to the directions that came with the product and then dry it. 3. Use the solution that comes with your product or one that is sold separately in stores. Follow  the mixing directions on the package to mix with sterile or distilled water. 4. Fill the device with the amount of saline solution noted in the device instructions. 5. Stand over a sink and tilt your head sideways over the sink. 6. Place the spout of the device in your upper nostril (the one closer to the ceiling). 7. Gently pour or squeeze the saline solution into your nasal cavity. The liquid should drain out from the lower nostril if you are not too congested. 8. While rinsing, breathe through your open mouth. 9. Gently blow your nose to clear any mucus and rinse solution. Blowing too hard may cause ear pain. 10. Repeat in your other nostril. 11. Clean and rinse your device with clean water and then air-dry it. Talk with your health care provider or pharmacist if you have questions about how to do a sinus rinse.   Summary  A sinus rinse is a home  treatment that is used to rinse your sinuses with a sterile mixture of salt and water (saline solution).  A sinus rinse is generally safe and effective. Follow all instructions carefully.  Before doing a sinus rinse, talk with your health care provider about whether it would be helpful for you. This information is not intended to replace advice given to you by your health care provider. Make sure you discuss any questions you have with your health care provider. Document Revised: 05/15/2020 Document Reviewed: 05/15/2020 Elsevier Patient Education  2021 [place]. Sinusitis, Adult Sinusitis is inflammation of your sinuses. Sinuses are hollow spaces in the bones around your face. Your sinuses are located:  Around your eyes.  In the middle of your forehead.  Behind your nose.  In your cheekbones. Mucus normally drains out of your sinuses. When your nasal tissues become inflamed or swollen, mucus can become trapped or blocked. This allows bacteria, viruses, and fungi to grow, which leads to infection. Most infections of the sinuses are caused by a virus. Sinusitis can develop quickly. It can last for up to 4 weeks (acute) or for more than 12 weeks (chronic). Sinusitis often develops after a cold. What are the causes? This condition is caused by anything that creates swelling in the sinuses or stops mucus from draining. This includes:  Allergies.  Asthma.  Infection from bacteria or viruses.  Deformities or blockages in your nose or sinuses.  Abnormal growths in the nose (nasal polyps).  Pollutants, such as chemicals or irritants in the air.  Infection from fungi (rare). What increases the risk? You are more likely to develop this condition if you:  Have a weak body defense system (immune system).  Do a lot of swimming or diving.  Overuse nasal sprays.  Smoke. What are the signs or symptoms? The main symptoms of this condition are pain and a feeling of pressure around  the affected sinuses. Other symptoms include:  Stuffy nose or congestion.  Thick drainage from your nose.  Swelling and warmth over the affected sinuses.  Headache.  Upper toothache.  A cough that may get worse at night.  Extra mucus that collects in the throat or the back of the nose (postnasal drip).  Decreased sense of smell and taste.  Fatigue.  A fever.  Sore throat.  Bad breath. How is this diagnosed? This condition is diagnosed based on:  Your symptoms.  Your medical history.  A physical exam.  Tests to find out if your condition is acute or chronic. This may include: ? Checking your nose  for nasal polyps. ? Viewing your sinuses using a device that has a light (endoscope). ? Testing for allergies or bacteria. ? Imaging tests, such as an MRI or CT scan. In rare cases, a bone biopsy may be done to rule out more serious types of fungal sinus disease. How is this treated? Treatment for sinusitis depends on the cause and whether your condition is chronic or acute.  If caused by a virus, your symptoms should go away on their own within 10 days. You may be given medicines to relieve symptoms. They include: ? Medicines that shrink swollen nasal passages (topical intranasal decongestants). ? Medicines that treat allergies (antihistamines). ? A spray that eases inflammation of the nostrils (topical intranasal corticosteroids). ? Rinses that help get rid of thick mucus in your nose (nasal saline washes).  If caused by bacteria, your health care provider may recommend waiting to see if your symptoms improve. Most bacterial infections will get better without antibiotic medicine. You may be given antibiotics if you have: ? A severe infection. ? A weak immune system.  If caused by narrow nasal passages or nasal polyps, you may need to have surgery. Follow these instructions at home: Medicines  Take, use, or apply over-the-counter and prescription medicines only as  told by your health care provider. These may include nasal sprays.  If you were prescribed an antibiotic medicine, take it as told by your health care provider. Do not stop taking the antibiotic even if you start to feel better. Hydrate and humidify  Drink enough fluid to keep your urine pale yellow. Staying hydrated will help to thin your mucus.  Use a cool mist humidifier to keep the humidity level in your home above 50%.  Inhale steam for 10-15 minutes, 3-4 times a day, or as told by your health care provider. You can do this in the bathroom while a hot shower is running.  Limit your exposure to cool or dry air.   Rest  Rest as much as possible.  Sleep with your head raised (elevated).  Make sure you get enough sleep each night. General instructions  Apply a warm, moist washcloth to your face 3-4 times a day or as told by your health care provider. This will help with discomfort.  Wash your hands often with soap and water to reduce your exposure to germs. If soap and water are not available, use hand sanitizer.  Do not smoke. Avoid being around people who are smoking (secondhand smoke).  Keep all follow-up visits as told by your health care provider. This is important.   Contact a health care provider if:  You have a fever.  Your symptoms get worse.  Your symptoms do not improve within 10 days. Get help right away if:  You have a severe headache.  You have persistent vomiting.  You have severe pain or swelling around your face or eyes.  You have vision problems.  You develop confusion.  Your neck is stiff.  You have trouble breathing. Summary  Sinusitis is soreness and inflammation of your sinuses. Sinuses are hollow spaces in the bones around your face.  This condition is caused by nasal tissues that become inflamed or swollen. The swelling traps or blocks the flow of mucus. This allows bacteria, viruses, and fungi to grow, which leads to infection.  If you  were prescribed an antibiotic medicine, take it as told by your health care provider. Do not stop taking the antibiotic even if you start to feel better.  Keep all follow-up visits as told by your health care provider. This is important. This information is not intended to replace advice given to you by your health care provider. Make sure you discuss any questions you have with your health care provider. Document Revised: 01/04/2018 Document Reviewed: 01/04/2018 Elsevier Patient Education  [place]. Otitis Media, Adult  Otitis media occurs when there is inflammation and fluid in the middle ear space with signs and symptoms of an acute infection. The middle ear is a part of the ear that contains bones for hearing as well as air that helps send sounds to the brain. When infected fluid builds up in this space, it causes pressure and results in symptoms of acute otitis media. The eustachian tube connects the middle ear to the back of the nose (nasopharynx) and normally allows air into the middle ear space. If the eustachian tube becomes blocked, fluid can build up and become infected. What are the causes? This condition is caused by a blockage in the eustachian tube. This can be caused by an object like mucus, or by swelling (edema) of the tube. Problems that can cause a blockage include:  A cold or other upper respiratory infection.  Allergies.  An irritant, such as tobacco smoke.  Enlarged adenoids. The adenoids are areas of soft tissue located high in the back of the throat, behind the nose and the roof of the mouth. They are part of the body's defense system (immune system).  A mass in the nasopharynx.  Damage to the ear caused by pressure changes (barotrauma). What are the signs or symptoms? Symptoms of this condition include:  Ear pain.  Fever.  Decreased hearing.  Tiredness (lethargy).  Fluid leaking from the ear, if the eardrum is ruptured or has burst.  Ringing in  the ear. How is this diagnosed? This condition is diagnosed with a physical exam. During the exam, your health care provider will use an instrument called an otoscope to look in your ear and check for redness, swelling, and fluid. He or she will also ask about your symptoms. Your health care provider may also order tests, such as:  A pneumatic otoscopy. This is a test to check the movement of the eardrum. It is done by squeezing a small amount of air into the ear.  A tympanogram is a test that shows how well the eardrum moves in response to air pressure in the ear canal. It provides a graph for your health care provider to review.   How is this treated? This condition can go away on its own within 3-5 days. But if the condition is caused by a bacterial infection and does not go away on its own, or if it keeps coming back, your health care provider may:  Prescribe antibiotic medicine to treat the infection.  Prescribe or recommend medicines to control pain. Follow these instructions at home:  Take over-the-counter and prescription medicines only as told by your health care provider.  If you were prescribed an antibiotic medicine, take it as told by your health care provider. Do not stop taking the antibiotic even if you start to feel better.  Keep all follow-up visits as told by your health care provider. This is important. Contact a health care provider if:  You have bleeding from your nose.  There is a lump on your neck.  You are not feeling better in 5 days.  You feel worse instead of better. Get help right away if:  You  have severe pain that is not controlled with medicine.  You have swelling, redness, or pain around your ear.  You have stiffness in your neck.  A part of your face is not moving (paralyzed).  The bone behind your ear (mastoid) is tender when you touch it.  You develop a severe headache. Summary  Otitis media is redness, soreness, and swelling of the  middle ear, usually resulting in pain.  This condition can go away on its own within 3-5 days.  If the problem does not go away in 3-5 days, your health care provider may prescribe or recommend medicines to treat the infection or your symptoms.  If you were prescribed an antibiotic medicine, take it as told by your health care provider.  Follow all instructions you were given by your health care provider. This information is not intended to replace advice given to you by your health care provider. Make sure you discuss any questions you have with your health care provider. Document Revised: 07/07/2019 Document Reviewed: 07/07/2019 Elsevier Patient Education  2021 [place]. Benign Positional Vertigo Vertigo is the feeling that you or your surroundings are moving when they are not. Benign positional vertigo is the most common form of vertigo. This is usually a harmless condition (benign). This condition is positional. This means that symptoms are triggered by certain movements and positions. This condition can be dangerous if it occurs while you are doing something that could cause harm to you or others. This includes activities such as driving or operating machinery. What are the causes? The inner ear has fluid-filled canals that help your brain sense movement and balance. When the fluid moves, the brain receives messages about your body's position. With benign positional vertigo, crystals in the inner ear break free and disturb the inner ear area. This causes your brain to receive confusing messages about your body's position. What increases the risk? You are more likely to develop this condition if:  You are a woman.  You are 86 years of age or older.  You have recently had a head injury.  You have an inner ear disease. What are the signs or symptoms? Symptoms of this condition usually happen when you move your head or your eyes in different directions. Symptoms may start suddenly,  and usually last for less than a minute. They include:  Loss of balance and falling.  Feeling like you are spinning or moving.  Feeling like your surroundings are spinning or moving.  Nausea and vomiting.  Blurred vision.  Dizziness.  Involuntary eye movement (nystagmus). Symptoms can be mild and cause only minor problems, or they can be severe and interfere with daily life. Episodes of benign positional vertigo may return (recur) over time. Symptoms may improve over time. How is this diagnosed? This condition may be diagnosed based on:  Your medical history.  Physical exam of the head, neck, and ears.  Positional tests to check for or stimulate vertigo. You may be asked to turn your head and change positions, such as going from sitting to lying down. A health care provider will watch for symptoms of vertigo. You may be referred to a health care provider who specializes in ear, nose, and throat problems (ENT, or otolaryngologist) or a provider who specializes in disorders of the nervous system (neurologist). How is this treated? This condition may be treated in a session in which your health care provider moves your head in specific positions to help the displaced crystals in your inner ear  move. Treatment for this condition may take several sessions. Surgery may be needed in severe cases, but this is rare. In some cases, benign positional vertigo may resolve on its own in 2-4 weeks.   Follow these instructions at home: Safety  Move slowly. Avoid sudden body or head movements or certain positions, as told by your health care provider.  Avoid driving until your health care provider says it is safe for you to do so.  Avoid operating heavy machinery until your health care provider says it is safe for you to do so.  Avoid doing any tasks that would be dangerous to you or others if vertigo occurs.  If you have trouble walking or keeping your balance, try using a cane for stability.  If you feel dizzy or unstable, sit down right away.  Return to your normal activities as told by your health care provider. Ask your health care provider what activities are safe for you. General instructions  Take over-the-counter and prescription medicines only as told by your health care provider.  Drink enough fluid to keep your urine pale yellow.  Keep all follow-up visits as told by your health care provider. This is important. Contact a health care provider if:  You have a fever.  Your condition gets worse or you develop new symptoms.  Your family or friends notice any behavioral changes.  You have nausea or vomiting that gets worse.  You have numbness or a prickling and tingling sensation. Get help right away if you:  Have difficulty speaking or moving.  Are always dizzy.  Faint.  Develop severe headaches.  Have weakness in your legs or arms.  Have changes in your hearing or vision.  Develop a stiff neck.  Develop sensitivity to light. Summary  Vertigo is the feeling that you or your surroundings are moving when they are not. Benign positional vertigo is the most common form of vertigo.  This condition is caused by crystals in the inner ear that become displaced. This causes a disturbance in an area of the inner ear that helps your brain sense movement and balance.  Symptoms include loss of balance and falling, feeling that you or your surroundings are moving, nausea and vomiting, and blurred vision.  This condition can be diagnosed based on symptoms, a physical exam, and positional tests.  Follow safety instructions as told by your health care provider. You will also be told when to contact your health care provider in case of problems. This information is not intended to replace advice given to you by your health care provider. Make sure you discuss any questions you have with your health care provider. Document Revised: 06/28/2019 Document Reviewed:  01/13/2018 Elsevier Patient Education  2021 [employer].

## 2021-01-08 NOTE — Progress Notes (Signed)
Subjective:    Patient ID: [name], female    DOB: 1961/07/31, 60 y.o.   MRN: [id number]  60y/o caucasian female with history recurrent sinus infections.  Last augmentin use April 2021 per chart review.  Has been taking advil cold and sinus for 3 weeks and it helps some but symptoms have never resolved.  Has taken 3 covid home tests over the last 3 weeks all negative.  Daughter getting married this weekend and will be traveling to [place] departing on Friday 01/11/21.  Ears and teeth hurting.  Denied ear discharge.  Forehead and cheeks hurting.  Dizzy at times with head position changes.  Ran out of meclizine/dramamine at home and would like refill.  Denied fever/chills/purulent sinus discharge/hemoptysis/dyspnea/wheezing/shortness of breath.  Patient consented to telephone visit.  This visit was conducted entirely via telephone.  I spent 10 minutes on the telephone with patient.  Patient was unable to connect via video or refused video appt type.  Visit was completed via audio only.  Patient location home [place], [place].  Provider location [facility].     Review of Systems  Constitutional: Negative for activity change, appetite change, chills, diaphoresis, fatigue and fever.  HENT: Positive for congestion, ear pain, postnasal drip, rhinorrhea, sinus pressure, sinus pain and sneezing. Negative for ear discharge, facial swelling, hearing loss, mouth sores, nosebleeds, tinnitus, trouble swallowing and voice change.   Eyes: Negative for photophobia, pain, discharge, redness, itching and visual disturbance.  Respiratory: Negative for cough, chest tightness, shortness of breath, wheezing and stridor.   Cardiovascular: Negative for chest pain.  Gastrointestinal: Negative for diarrhea, nausea and vomiting.  Endocrine: Negative for cold intolerance and heat intolerance.  Genitourinary: Negative for difficulty urinating.  Musculoskeletal: Negative for back pain, gait problem, neck  pain and neck stiffness.  Allergic/Immunologic: Positive for environmental allergies. Negative for food allergies.  Neurological: Positive for dizziness and headaches. Negative for tremors, seizures, syncope, facial asymmetry, speech difficulty, weakness, light-headedness and numbness.  Hematological: Negative for adenopathy. Does not bruise/bleed easily.  Psychiatric/Behavioral: Negative for agitation, confusion and sleep disturbance.       Objective:   Physical Exam Vitals and nursing note reviewed.  Constitutional:      General: She is not in acute distress. HENT:     Right Ear: Hearing normal.     Left Ear: Hearing normal.     Nose: Congestion present.     Right Sinus: Maxillary sinus tenderness and frontal sinus tenderness present.     Left Sinus: Maxillary sinus tenderness and frontal sinus tenderness present.     Mouth/Throat:     Mouth: Mucous membranes are not pale, not dry and not cyanotic.  Eyes:     Pupils: Pupils are equal.     Right eye: Pupil is round and reactive.     Left eye: Pupil is round and reactive.  Neck:     Trachea: Phonation normal.  Pulmonary:     Effort: Pulmonary effort is normal. No respiratory distress.     Breath sounds: Normal breath sounds and air entry. No stridor. No wheezing.     Comments: Spoke full sentences without difficulty; no cough/nasal sniffing or throat clearing noted during 10 minute telephone call Neurological:     Mental Status: She is alert and oriented to person, place, and time. She is not disoriented.  Psychiatric:        Attention and Perception: Attention and perception normal.        Mood  and Affect: Mood normal.        Speech: Speech normal.        Behavior: Behavior normal. Behavior is cooperative.        Thought Content: Thought content normal.        Cognition and Memory: Cognition and memory normal.        Judgment: Judgment normal.           Assessment & Plan:  A-BPPV, acute sinusitis recurrent maxillary,  eustachian tube dysfunction  P-Discussed with patient otitis media or otic effusion probably causing vertigo but could also be age. Meclizine 25mg  po prn helpful in the past. Supportive treatment may take up to 4 doses meclizine per day max 100mg  per 24 hours.  Electronic Rx Meclizine 25mg  po QID prn vertigo #30 RF0 sent to her pharmacy of choice. Follow up if aphasia, dysphasia, visual changes, weakness, fall, worst headache of life, incoordination, fever, ear discharge. Consider ENT evaluation/follow up with PCM if worsening symptoms not controlled with meclizine.  Exitcare handout on vertigo printed and mailed to patient.   Patient verbalized understanding of information/agreed with plan of care and had no further questions at this time.    No evidence of invasive bacterial infection, non toxic and well hydrated.  I do not see where any further testing or imaging is necessary at this time.   I will suggest supportive care, rest, good hygiene and encourage the patient to take adequate fluids.  The patient is to return to clinic or EMERGENCY ROOM if symptoms worsen or change significantly e.g. ear pain, fever, purulent discharge from ears or bleeding.  Exitcare handout on eustachian tube dysfunction printed and given to patient.  Discussed with patient post nasal drip irritates throat/causes swelling blocks eustachian tubes from draining and fluid fills up middle ear.  Bacteria/viruses can grow in fluid and with moving head tube compressed and increases pressure in tube/ear worsening pain.  Studies show will take 30 days for fluid to resolve after post nasal drip controlled with nasal steroid/antihistamine. Antibiotics and steroids do not speed up fluid removal.  Patient verbalized agreement and understanding of treatment plan and had no further questions at this time.  Continue flonase 1 spray each nostril BID, increase nasal saline 2 sprays each nostril q2h wa prn congestion.  Start prednisone 10mg  po taper  20mg  x 5 days; 10mg  x2 days 5mg  x 2 days #21 RF0 dispensed from [facility] and mailed to patient.  If no improvement with 48 hours of saline and flonase use start augmentin 875mg  po BID x 10 days #20 RF0 dispensed from [facility] to patient  Denied personal or family history of ENT cancer.  Shower BID especially prior to bed. No evidence of systemic bacterial infection, non toxic and well hydrated.  I do not see where any further testing or imaging is necessary at this time.   I will suggest supportive care, rest, good hygiene and encourage the patient to take adequate fluids.  The patient is to return to clinic or EMERGENCY ROOM if symptoms worsen or change significantly.  Exitcare handout on sinusitis and sinus rinse printed and sent to patient.  Patient verbalized agreement and understanding of treatment plan and had no further questions at this time.   P2:  Hand washing and cover cough

## 2021-03-26 IMAGING — MG MM DIGITAL DIAGNOSTIC UNILAT*L* W/ TOMO W/ CAD
6 of 10 series · 6 of 30 positions shown · non-contrast
Comparison: Screening study due 06/02/2019 and older exams.

CLINICAL DATA: Recall from screening for a possible asymmetry in
the left breast.

EXAM:
DIGITAL DIAGNOSTIC LEFT MAMMOGRAM WITH CAD AND TOMO
ULTRASOUND LEFT BREAST

[L MLO synth-2D (1 of 2)]
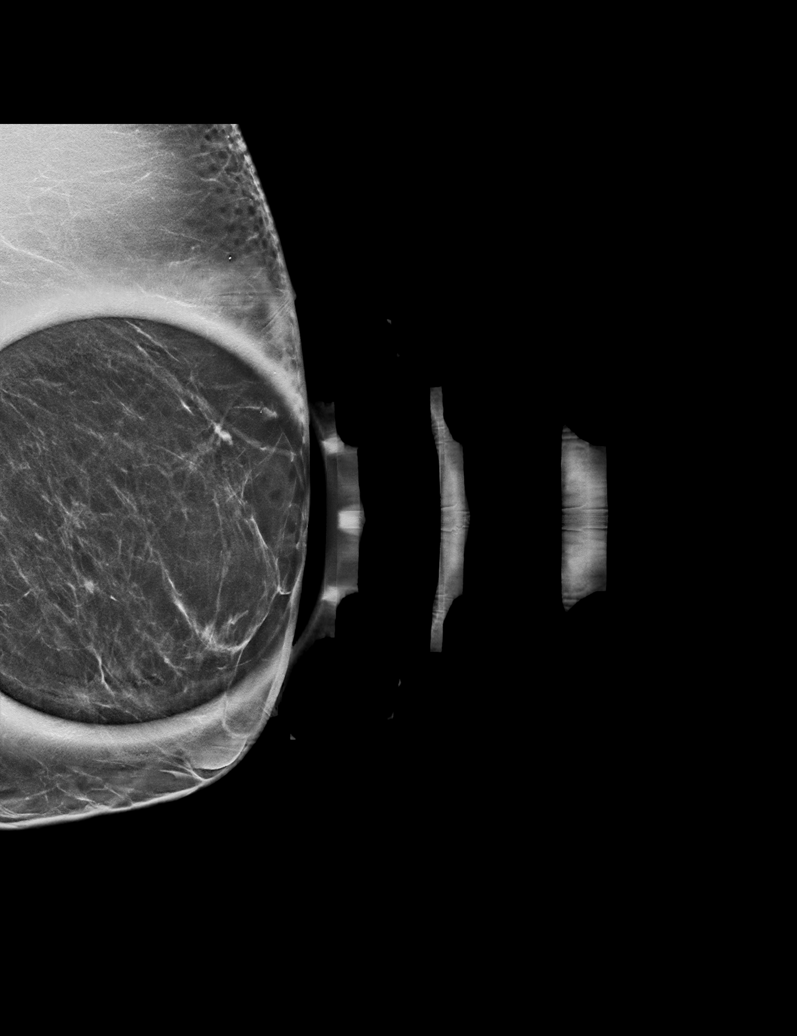

[L MLO synth-2D (2 of 2)]
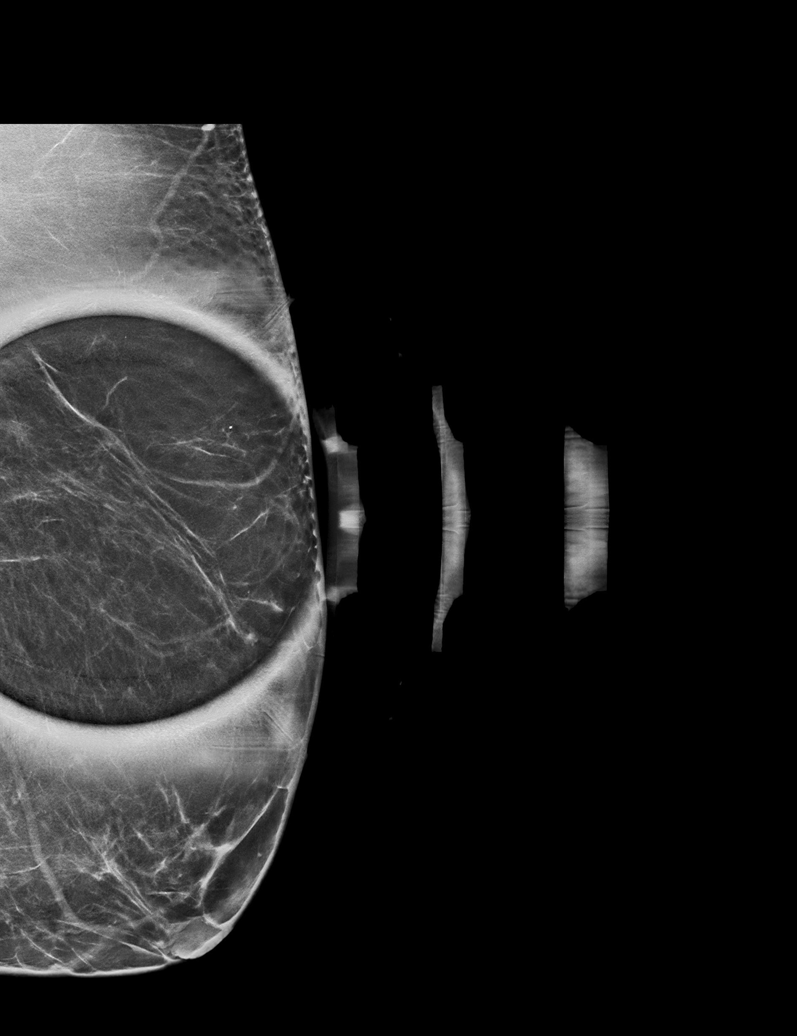

[L ML synth-2D]
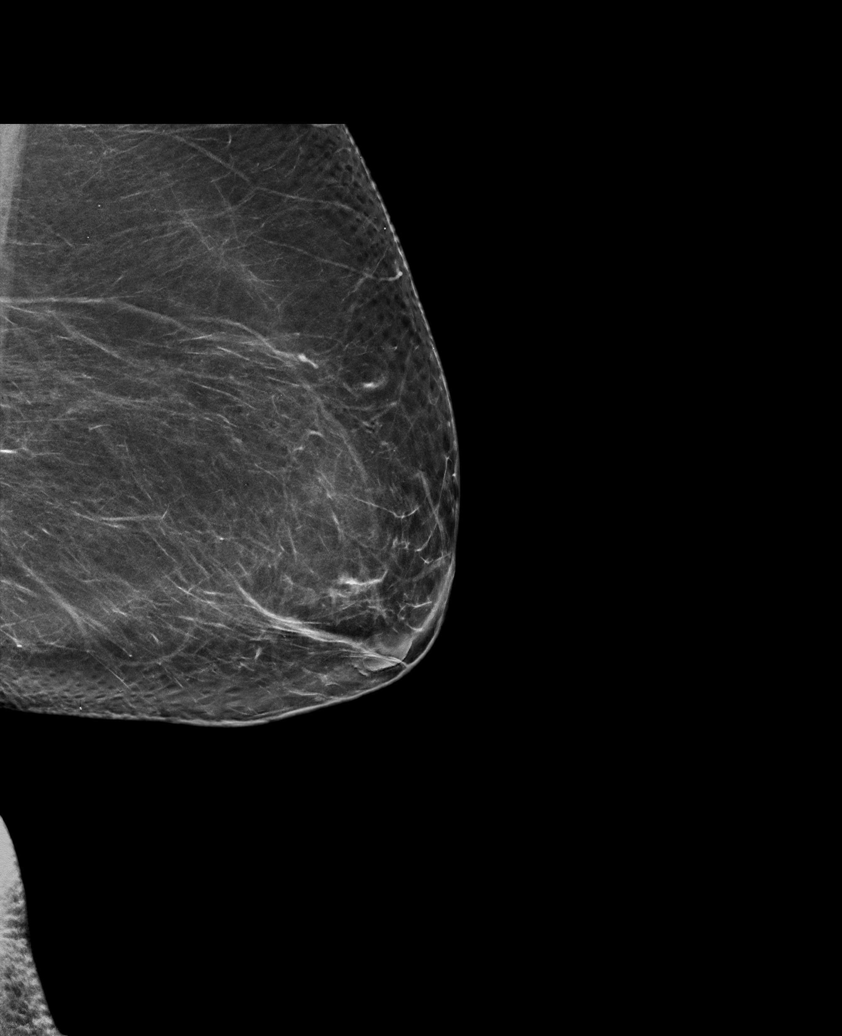

[L CC synth-2D (1 of 2)]
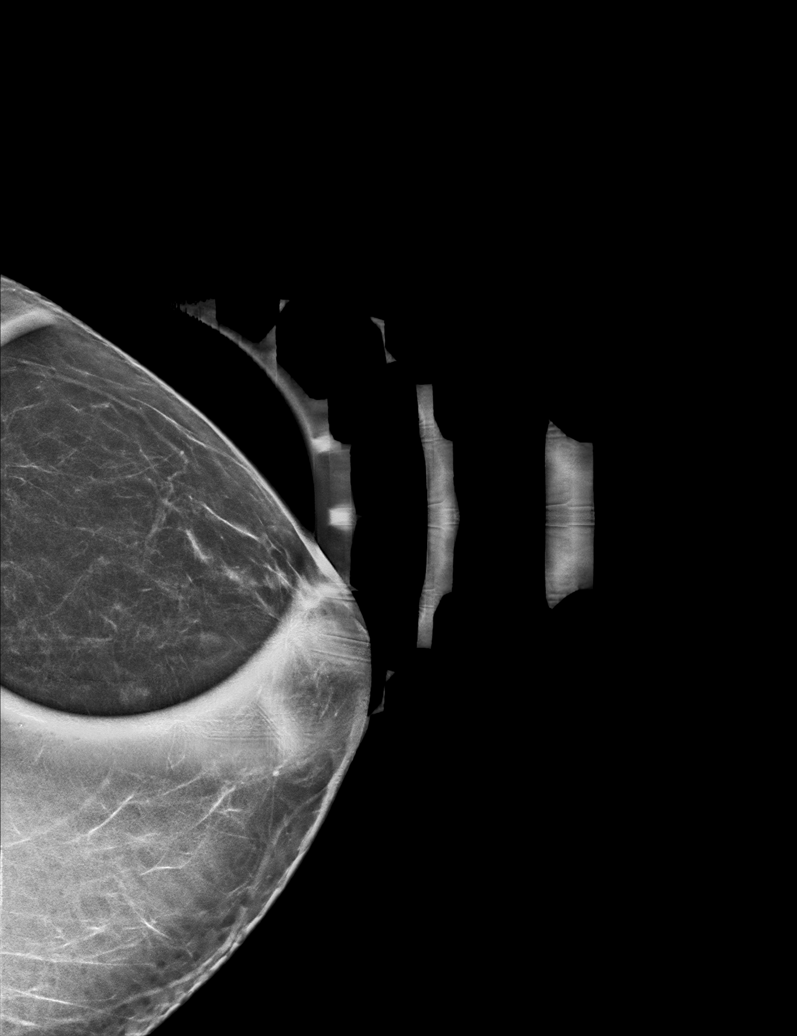

[L CC synth-2D (2 of 2)]
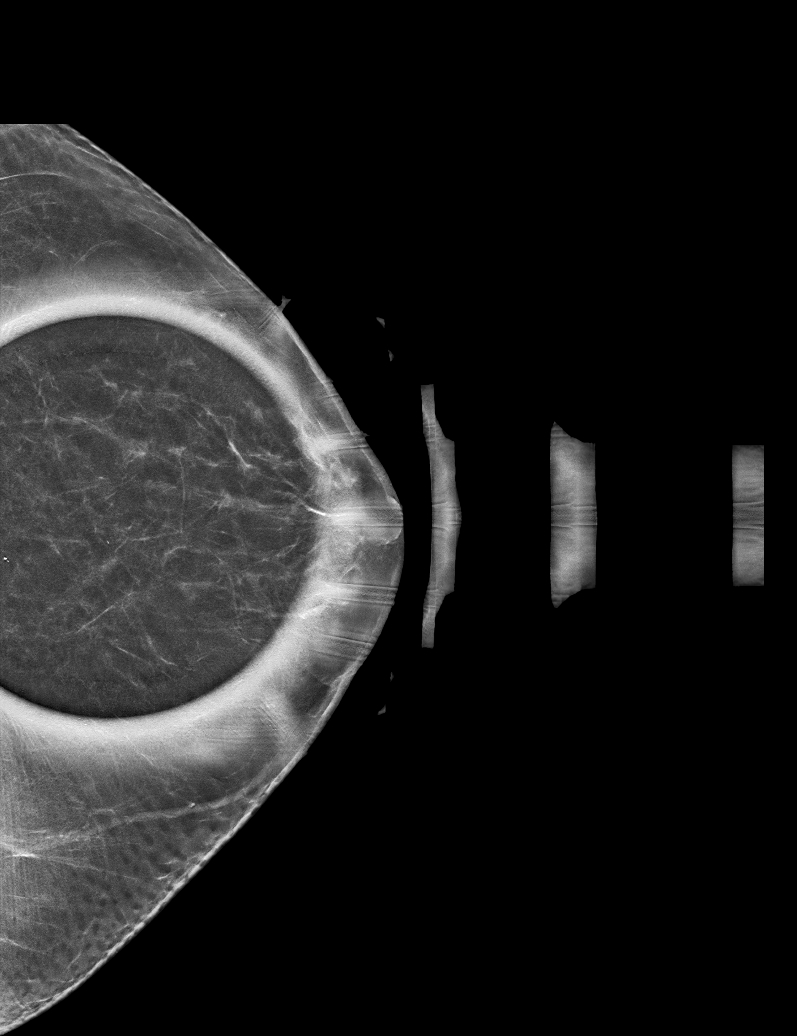

[L CC tomo · tomo slice 27/53.0]
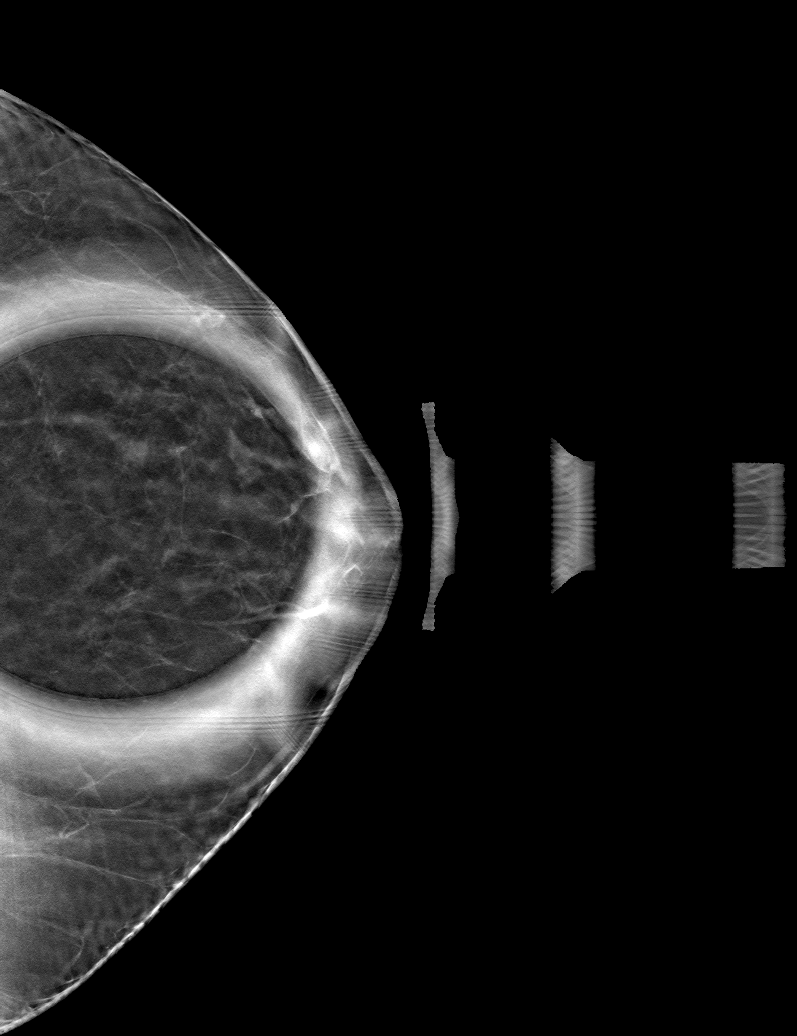

[6 of 30 positions shown; findings below may reference images not displayed]

ACR Breast Density Category b: There are scattered areas of
fibroglandular density.
FINDINGS: The area of asymmetry which is small, subtle and of mixed
attenuation, measures smaller than it did on the screening study
from 06/02/2019. There are no defined masses and no other areas of
asymmetry. No architectural distortion and no suspicious
calcifications.

Mammographic images were processed with CAD.

On physical exam, no mass is palpated in the upper outer left
breast.

Targeted ultrasound is performed, showing a hyperechoic lesion in
the left breast at 1 o'clock, 4 cm from the nipple, measuring 10 x 7
x 8 mm, containing a central sliver of hypoechogenicity. This is
consistent with fat necrosis, also supported by its decrease in size
mammographically. It lies a few mm below the dermis.
IMPRESSION: 1. Benign area of left breast fat necrosis.
2. No evidence of breast malignancy.

RECOMMENDATION:
Screening mammogram in one year.(Code:YJ-[id number])

I have discussed the findings and recommendations with the patient.
If applicable, a reminder letter will be sent to the patient
regarding the next appointment.

BI-RADS CATEGORY  2: Benign.

## 2021-05-27 ENCOUNTER — Telehealth (INDEPENDENT_AMBULATORY_CARE_PROVIDER_SITE_OTHER): Payer: PRIVATE HEALTH INSURANCE | Admitting: Family Medicine

## 2021-05-27 ENCOUNTER — Encounter: Payer: Self-pay | Admitting: Family Medicine

## 2021-05-27 ENCOUNTER — Other Ambulatory Visit: Payer: Self-pay

## 2021-05-27 DIAGNOSIS — U071 COVID-19: Secondary | ICD-10-CM | POA: Diagnosis not present

## 2021-05-27 MED ORDER — ALBUTEROL SULFATE HFA 108 (90 BASE) MCG/ACT IN AERS: 1.0000 | INHALATION_SPRAY | RESPIRATORY_TRACT | 5 refills | Status: DC | PRN

## 2021-05-27 MED ORDER — MOLNUPIRAVIR EUA 200MG CAPSULE: 4.0000 | ORAL_CAPSULE | Freq: Two times a day (BID) | ORAL | 0 refills | Status: AC

## 2021-05-27 NOTE — Progress Notes (Signed)
Virtual visit completed through WebEx or similar program Patient location: home  Provider location: [place] [facility], office  Participants: Patient and me (unless stated otherwise below)  Pandemic considerations d/w pt.   Limitations and rationale for visit method d/w patient.  Patient agreed to proceed.   CC: covid   HPI: sx started 05/24/21.  Stuffy and felt like she had a cold.  Today is day #3.  Still using inhalers at baseline but needs a refill on albuterol.  Rx sent.  No fevers.  Some chills today.  No vomiting.  Does have diarrhea.  Not SOB.  No loss of taste or smell.  She is vaccinated.  Quarantine cautions d/w pt.  She is working from home. Fatigue noted.     Meds and allergies reviewed.   ROS: Per HPI unless specifically indicated in ROS section   NAD Speech wnl  A/P: COVID.  Today is day 3.  Discussed with patient about emergency authorization for antivirals.  Reasonable to use Molnupiravir.  Routine cautions given to patient.  Routine quarantine instructions given.  Supportive care otherwise.  She will update me as needed.  She agrees with plan. Continue as needed albuterol.  Not short of breath.  Okay for outpatient follow-up.

## 2021-05-29 DIAGNOSIS — U071 COVID-19: Secondary | ICD-10-CM | POA: Insufficient documentation

## 2021-05-29 NOTE — Assessment & Plan Note (Signed)
  COVID.  Today is day 3.  Discussed with patient about emergency authorization for antivirals.  Reasonable to use Molnupiravir.  Routine cautions given to patient.  Routine quarantine instructions given.  Supportive care otherwise.  She will update me as needed.  She agrees with plan.  Continue as needed albuterol.  Not short of breath.  Okay for outpatient follow-up.

## 2021-06-05 ENCOUNTER — Telehealth: Payer: Self-pay | Admitting: Registered Nurse

## 2021-06-05 ENCOUNTER — Encounter: Payer: Self-pay | Admitting: Registered Nurse

## 2021-06-05 DIAGNOSIS — U071 COVID-19: Secondary | ICD-10-CM

## 2021-06-05 NOTE — Telephone Encounter (Signed)
Notified by RN Hildred Alamin yesterday patient home covid test positive 10/9. Was supposed to come onsite at work 06/04/21 for training. However sx are not improved enough and she is still testing positive today 10/17 (day 8). Advised her based on symptoms that she should remain off-site and return for next training session next week as long as her symptoms are improving.  Agreed with plan of care.  Attempted to contacted patient 06/05/21 no answer left message.

## 2021-06-06 NOTE — Telephone Encounter (Signed)
Symptom follow up today. No answer. LVM.

## 2021-06-07 MED ORDER — ALBUTEROL SULFATE HFA 108 (90 BASE) MCG/ACT IN AERS: 1.0000 | INHALATION_SPRAY | RESPIRATORY_TRACT | 5 refills | Status: DC | PRN

## 2021-06-07 MED ORDER — BENZONATATE 200 MG PO CAPS: 200.0000 mg | ORAL_CAPSULE | Freq: Three times a day (TID) | ORAL | 0 refills | Status: AC | PRN

## 2021-06-07 MED ORDER — PREDNISONE 10 MG (21) PO TBPK
ORAL_TABLET | ORAL | 0 refills | Status: DC
Start: 2021-06-07 — End: 2021-10-25

## 2021-06-07 NOTE — Telephone Encounter (Signed)
Patient contacted via telephone and stated can't shake this virus.  Nasal congestion/stopped up comes and goes.  Cough worsening.  Reported she is having some shortness of breath/wheezing started middle of last week.  Using albuterol 2 puffs 1-2 times per day and symbicort am/pm.  Advil cold and sinus not helping at all.  Finished covid antivirals last week.  Refilled albuterol inhaler 1-2 puffs every 4-6h prn wheeze/sob #18g RF5 Rx as expired and ran out of tessalon pearles 200mg  po TID prn cough #60 RF0. Patient to start prednisone taper 10mg  (60mg  day 1/50/40/30/20/10) #21 RF0    Usual cough much worse per patient.  Mucous green.  Frequent productive cough during 11 minute telephone conversation.  Patient asked if she can attend onsite training at work on 25 Oct discussed will re-evaluate her 09 Jun 2021.  Nasal congestion noted.  Patient also stated having dry mouth discussed mucous production/nasal congestion/mouth breathing increases loss of body fluids keep hydrating with water to keep urine pale yellow clear and voiding every 2-4 hours.  Patient asked when URI symptoms will end discussed with patient each patient different with covid but typically by week 3 symptoms improving as long as no sinusitis/bronchitis/pneumonia.  Patient A&Ox3 spoke full sentences without difficulty no wheezing audible.  HR notified will re-evaluate 23 Oct 22.  Patient verbalized understanding information/instructions, agreed with plan of care and had no further questions at this time.

## 2021-06-09 NOTE — Telephone Encounter (Signed)
Patient reported started prednisone and tessalon pearles yesterday.  Congestion has eased but having more runny nose now.  "I am glad things finally draining and cough settled down"  Using albuterol inhaler 1-2 times per day.  Discussed with patient cleared to come onsite this week as symptoms improving but should still wear mask and not eat in employee lunch room.  May eat outside, use two room across from clinic.  Use straw under mask to drink when in training.  HR notified patient cleared to come back onsite with mask wear.  Discussed with patient viral shedding after day 21 rare per studies.  Patient A&Ox3 spoke full sentences without  difficulty some nasal congestion no audible cough or throat clearing noted during 3 minute telephone call.  Patient verbalized understanding information/instructions, agreed with plan of care and had no further questions at this time.

## 2021-06-10 NOTE — Telephone Encounter (Signed)
Spoke with pt by phone. She reports feeling much better today and feels well enough to return on site tomorrow 10/25 for training class.  Strict mask use rules from [place] repeated back to RN and pt denies any further questions or concerns.

## 2021-06-10 NOTE — Telephone Encounter (Signed)
Reviewed RN Hildred Alamin note symptoms improving may return onsite with mask wear

## 2021-06-11 NOTE — Telephone Encounter (Signed)
Pt did RTW onsite 10/25 as expected. Closing encounter.

## 2021-06-11 NOTE — Telephone Encounter (Signed)
Reviewed RN Hildred Alamin note patient RTW onsite today as expected.

## 2021-06-21 NOTE — Telephone Encounter (Signed)
Patient contacted via telephone stated still having fatigue and some cough.  Mucous clear.  Denied fever/chills.  Using her albuterol inhaler.  Date of positive covid test was 69 Oct 22.  Discussed with patient most patients feeling better week 3.  Continue hydrating, eating healthy meals, sleeping 7-8 hours per night and naps as needed. Continue nasal saline, flonase nasal spray, singulair and zyrtec use.  Prn tessalon pearles, abluterol. Will follow up with patient in one week via telephone. Discussed bivalent covid booster recommended 3 months after covid illness symptomatic.  Patient has not had new booster yet.   Discussed with patient to contact me if new or worsening symptoms.  Patient verbalized understanding information/instructions, agreed with plan of care and had no further questions at this time.  Duration of call 4 minutes, cough mild audible during call, spoke full sentences without difficulty no audible throat clearing or congestion nasal noted.

## 2021-07-01 ENCOUNTER — Telehealth: Payer: Self-pay | Admitting: *Deleted

## 2021-07-01 DIAGNOSIS — Z20822 Contact with and (suspected) exposure to covid-19: Secondary | ICD-10-CM

## 2021-07-01 NOTE — Telephone Encounter (Signed)
Notified by HR patient exposed to coworker who tested positive for covid on 06/29/21.  RN Hildred Alamin instructed to contact patient to discuss exposure protocols.  Agreed with plan of care for testing/mask wear when at work x 10 days, no eating in employee lunch room and monitoring for coving symptoms. Reviewed RN Hildred Alamin note.   Patient asymptomatic.

## 2021-07-01 NOTE — Telephone Encounter (Signed)
Pt with covid exposure to positive coworker 11/11.  Spoke with pt by phone. She confirms she is asymptomatic. May RTW tomorrow 11/15 with mask wear thru Day 10 11/21. Test Day 5 post exposure, 11/16.  Notify clinic if any sx develop for updated testing guidelines.  Pt agreeable and denies any questions or concerns.

## 2021-07-02 NOTE — Telephone Encounter (Signed)
Hand delivered to patient home gemfibrozil, hydrochlorothiazide [facility] refills 90 day supply to patient residence today.

## 2021-07-04 NOTE — Telephone Encounter (Signed)
Pt remains asymptomatic. Negative covid test 11/16 Day 5. Coming onsite Monday 11/21 for training class. Strict mask use thru 11/21 Day 10. Pt agreeable and denies needs or concerns.

## 2021-07-04 NOTE — Telephone Encounter (Signed)
Patient left message today symptoms resolved except for her usual cough.  Encounter closed.

## 2021-07-04 NOTE — Telephone Encounter (Signed)
Patient had left NP message covid test negative and has her chronic cough only no change/worsening.  Reviewed RN Hildred Alamin note agreed with plan of care.

## 2021-07-08 NOTE — Telephone Encounter (Signed)
Called pt to confirm still asymptomatic as of Day 10 today. No answer. LVM. Did have training class scheduled today. Will f/u again tomorrow.

## 2021-07-09 MED ORDER — ONDANSETRON 8 MG PO TBDP: 8.0000 mg | ORAL_TABLET | Freq: Three times a day (TID) | ORAL | 0 refills | Status: DC | PRN

## 2021-07-09 NOTE — Telephone Encounter (Signed)
Day 10 yesterday. Spoke with pt by phone. She reports she started having sx Sunday night 11/20. Started having n/v/d. Son gave her antinausea pills that stopped the vomiting last night. Took one this morning. 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 Diarrhea this morning one episode but none since.  Out of zofran and asks if more can be sent in case nausea recurs. Rx sent. She was not onsite yesterday as planned due to sx and slept most of the day.  Wondering if flu or covid. Advised she can do home covid test. If negative, could be flu and discussed antivirals within 48hr of sx. Would need to start tonight. Informed pt that many pharmacies out of generic Tamiflu due to short supply and brand would be $75. She declines this and Xofluza.  NP Otila Kluver will f/u tomorrow or this week. Pt given NP remote work number if needed/sx worsen prior to f/u. Pt denies questions/concerns.

## 2021-07-10 NOTE — Telephone Encounter (Signed)
Reviewed RN Hildred Alamin note agreed with plan of care signed zofran electronic Rx and sent to patient pharmacy of choice.  Telephone message left for patient to contact me or email symptom update and if covid home test performed today/results.

## 2021-07-12 ENCOUNTER — Encounter: Payer: Self-pay | Admitting: *Deleted

## 2021-07-12 MED ORDER — BENZONATATE 200 MG PO CAPS: 200.0000 mg | ORAL_CAPSULE | Freq: Three times a day (TID) | ORAL | 1 refills | Status: AC | PRN

## 2021-07-12 MED ORDER — ALBUTEROL SULFATE HFA 108 (90 BASE) MCG/ACT IN AERS: 1.0000 | INHALATION_SPRAY | RESPIRATORY_TRACT | 5 refills | Status: DC | PRN

## 2021-07-16 NOTE — Telephone Encounter (Signed)
Spoke with pt by phone. She reports "I think I'm doing better then it gets worse. It's back and forth each day." Sore throat and nasal congestion/stuffiness are lingering. Discussed honey, popsicles, warm salty broths, avoiding dairy, cough drops, chloraseptic spray for sore throat. Phenylephrine and continuing current nose sprays including lots of nasal saline for congestion. Advised NP may call Wed if other suggestions or for further f/u. Pt verbalized understanding.

## 2021-07-19 NOTE — Telephone Encounter (Signed)
Reviewed RN Hildred Alamin note will follow up with patient via telephone tomorrow.

## 2021-07-28 MED ORDER — ALBUTEROL SULFATE HFA 108 (90 BASE) MCG/ACT IN AERS: 1.0000 | INHALATION_SPRAY | RESPIRATORY_TRACT | 5 refills | Status: DC | PRN

## 2021-07-28 NOTE — Telephone Encounter (Signed)
Patient contacted via telephone and stated symptoms have been "back and forth" with cough and congestion.  Good for a couple days then back again.  Using her inhaler and tessalon pearles with good relief.  Albuterol inhaler Rx expired refilled 160mcg/act 1-2 puffs po q4-6h prn protracted cough/wheezing #1 RF5 sent to her pharmacy of choice.  Discussed the weather changing from humid/rainy balmy to cold sunny breezy every couple of days can trigger cough also.  Patient A&Ox3 spoke full sentences without difficulty.  One mild nonproductive cough during 2 minute telephone call.  No audible throat clearing/congestion or wheezing/shortness of breath.  Patient to follow up if worsening with clinic staff.  Patient verbalized understanding information/instructions, agreed with plan of care and had no further questions at this time.

## 2021-08-04 NOTE — Telephone Encounter (Signed)
Telephone message left for patient symptom follow up please call [phone]

## 2021-08-15 ENCOUNTER — Telehealth: Payer: Self-pay | Admitting: Registered Nurse

## 2021-08-15 ENCOUNTER — Encounter: Payer: Self-pay | Admitting: Registered Nurse

## 2021-08-15 DIAGNOSIS — R051 Acute cough: Secondary | ICD-10-CM

## 2021-08-15 MED ORDER — PROMETHAZINE HCL 6.25 MG/5ML PO SYRP: 6.2500 mg | ORAL_SOLUTION | Freq: Four times a day (QID) | ORAL | 0 refills | Status: DC | PRN

## 2021-08-15 NOTE — Telephone Encounter (Signed)
Patient contacted via telephone cough still not back to baseline "I think it is this cold weather."  Coughing until she gags.  Using her albuterol inhaler and tessalon pearles and they help during the day but not at night.  Discussed promethazine syrup at bedtime 1 teaspoon and patient stated she would like to try.  Discussed can cause drowsiness so do not recommend taking during the day when working or needing to drive.  Electronic Rx to her pharmacy of choice promethazine 6.25mg /67ml sig take 5 ml po q6h prn cough #120 RF0.  Will follow up with patient next week to follow up as cough still not at baseline chronic.  Continue tessalon pearles and albuterol 2 puffs po q6h on regular schedule.  Patient A&Ox3 spoke full sentences without difficulty no cough/congestion/wheezing audible during 5 minute telephone call.  Patient to follow up sooner if new or worsening symptoms.  Patient verbalized understanding information/instructions, agreed with plan of care and had no further questions at this time.

## 2021-08-16 NOTE — Telephone Encounter (Signed)
See new tcon dated 08/15/2021

## 2021-08-26 NOTE — Telephone Encounter (Signed)
Called pt to check in, cough/med use. No answer. LVM.

## 2021-09-03 NOTE — Telephone Encounter (Signed)
Patient left message cough back to baseline.  Still using tessalon pearles and inhaler prn but not on regular schedule.  Promethazine syrup helped when she was flared up to sleep at night.  Does not need refill of tessalon pearles at this time but will notify clinic staff when she does need more.  Patient stated her phone had a problem and she just received all of the voicemails today.  Encounter closed.

## 2021-09-22 ENCOUNTER — Other Ambulatory Visit: Payer: Self-pay | Admitting: Family Medicine

## 2021-09-22 DIAGNOSIS — E785 Hyperlipidemia, unspecified: Secondary | ICD-10-CM

## 2021-09-22 DIAGNOSIS — E039 Hypothyroidism, unspecified: Secondary | ICD-10-CM

## 2021-09-22 DIAGNOSIS — E559 Vitamin D deficiency, unspecified: Secondary | ICD-10-CM

## 2021-09-22 DIAGNOSIS — I10 Essential (primary) hypertension: Secondary | ICD-10-CM

## 2021-09-27 ENCOUNTER — Other Ambulatory Visit: Payer: Self-pay

## 2021-09-27 ENCOUNTER — Other Ambulatory Visit (INDEPENDENT_AMBULATORY_CARE_PROVIDER_SITE_OTHER): Payer: No Typology Code available for payment source

## 2021-09-27 DIAGNOSIS — E039 Hypothyroidism, unspecified: Secondary | ICD-10-CM | POA: Diagnosis not present

## 2021-09-27 DIAGNOSIS — E785 Hyperlipidemia, unspecified: Secondary | ICD-10-CM | POA: Diagnosis not present

## 2021-09-27 DIAGNOSIS — E559 Vitamin D deficiency, unspecified: Secondary | ICD-10-CM

## 2021-09-27 DIAGNOSIS — I10 Essential (primary) hypertension: Secondary | ICD-10-CM | POA: Diagnosis not present

## 2021-09-27 LAB — COMPREHENSIVE METABOLIC PANEL
ALT: 25 U/L (ref 0–35)
AST: 19 U/L (ref 0–37)
Albumin: 4 g/dL (ref 3.5–5.2)
Alkaline Phosphatase: 89 U/L (ref 39–117)
BUN: 10 mg/dL (ref 6–23)
CO2: 33 mEq/L — ABNORMAL HIGH (ref 19–32)
Calcium: 9.5 mg/dL (ref 8.4–10.5)
Chloride: 101 mEq/L (ref 96–112)
Creatinine, Ser: 0.66 mg/dL (ref 0.40–1.20)
GFR: 96.39 mL/min (ref >60.00)
Glucose, Bld: 115 mg/dL — ABNORMAL HIGH (ref 70–99)
Potassium: 4.1 mEq/L (ref 3.5–5.1)
Sodium: 140 mEq/L (ref 135–145)
Total Bilirubin: 0.5 mg/dL (ref 0.2–1.2)
Total Protein: 6.9 g/dL (ref 6.0–8.3)

## 2021-09-27 LAB — CBC WITH DIFFERENTIAL/PLATELET
Basophils Absolute: 0.1 10*3/uL (ref 0.0–0.1)
Basophils Relative: 1.1 % (ref 0.0–3.0)
Eosinophils Absolute: 0.2 10*3/uL (ref 0.0–0.7)
Eosinophils Relative: 2.2 % (ref 0.0–5.0)
HCT: 38 % (ref 36.0–46.0)
Hemoglobin: 12.6 g/dL (ref 12.0–15.0)
Lymphocytes Relative: 27.2 % (ref 12.0–46.0)
Lymphs Abs: 2.4 10*3/uL (ref 0.7–4.0)
MCHC: 33.1 g/dL (ref 30.0–36.0)
MCV: 90.2 fl (ref 78.0–100.0)
Monocytes Absolute: 0.8 10*3/uL (ref 0.1–1.0)
Monocytes Relative: 8.6 % (ref 3.0–12.0)
Neutro Abs: 5.4 10*3/uL (ref 1.4–7.7)
Neutrophils Relative %: 60.9 % (ref 43.0–77.0)
Platelets: 261 10*3/uL (ref 150.0–400.0)
RBC: 4.21 Mil/uL (ref 3.87–5.11)
RDW: 14.6 % (ref 11.5–15.5)
WBC: 8.8 10*3/uL (ref 4.0–10.5)

## 2021-09-27 LAB — LDL CHOLESTEROL, DIRECT: Direct LDL: 117 mg/dL

## 2021-09-27 LAB — LIPID PANEL
Cholesterol: 182 mg/dL (ref 0–200)
HDL: 40.5 mg/dL (ref >39.00)
NonHDL: 141.34
Total CHOL/HDL Ratio: 4
Triglycerides: 219 mg/dL — ABNORMAL HIGH (ref 0.0–149.0)
VLDL: 43.8 mg/dL — ABNORMAL HIGH (ref 0.0–40.0)

## 2021-09-27 LAB — HEMOGLOBIN A1C: Hgb A1c MFr Bld: 6.1 % (ref 4.6–6.5)

## 2021-09-27 LAB — VITAMIN D 25 HYDROXY (VIT D DEFICIENCY, FRACTURES): VITD: 30.85 ng/mL (ref 30.00–100.00)

## 2021-09-27 LAB — TSH: TSH: 3.29 u[IU]/mL (ref 0.35–5.50)

## 2021-10-03 ENCOUNTER — Encounter: Payer: PRIVATE HEALTH INSURANCE | Admitting: Family Medicine

## 2021-10-10 ENCOUNTER — Other Ambulatory Visit: Payer: Self-pay | Admitting: Family Medicine

## 2021-10-25 ENCOUNTER — Encounter: Payer: Self-pay | Admitting: Family Medicine

## 2021-10-25 ENCOUNTER — Ambulatory Visit (INDEPENDENT_AMBULATORY_CARE_PROVIDER_SITE_OTHER): Payer: No Typology Code available for payment source | Admitting: Family Medicine

## 2021-10-25 ENCOUNTER — Other Ambulatory Visit: Payer: Self-pay

## 2021-10-25 VITALS — BP 130/82 | HR 79 | Temp 97.5°F | Ht 66.0 in | Wt 286.0 lb

## 2021-10-25 DIAGNOSIS — J302 Other seasonal allergic rhinitis: Secondary | ICD-10-CM

## 2021-10-25 DIAGNOSIS — Z0000 Encounter for general adult medical examination without abnormal findings: Secondary | ICD-10-CM | POA: Diagnosis not present

## 2021-10-25 MED ORDER — CARVEDILOL 6.25 MG PO TABS: 6.2500 mg | ORAL_TABLET | Freq: Two times a day (BID) | ORAL | 3 refills | Status: DC

## 2021-10-25 MED ORDER — GABAPENTIN 300 MG PO CAPS: 900.0000 mg | ORAL_CAPSULE | Freq: Every day | ORAL | 3 refills | Status: DC

## 2021-10-25 MED ORDER — MONTELUKAST SODIUM 10 MG PO TABS: 10.0000 mg | ORAL_TABLET | Freq: Every day | ORAL | 3 refills | Status: DC

## 2021-10-25 MED ORDER — PREDNISONE 10 MG PO TABS: ORAL_TABLET | ORAL | 0 refills | Status: DC

## 2021-10-25 MED ORDER — FUROSEMIDE 20 MG PO TABS: ORAL_TABLET | ORAL | 3 refills | Status: DC

## 2021-10-25 MED ORDER — HYDROCHLOROTHIAZIDE 12.5 MG PO CAPS: ORAL_CAPSULE | ORAL | 3 refills | Status: DC

## 2021-10-25 MED ORDER — OMEPRAZOLE 40 MG PO CPDR: 40.0000 mg | DELAYED_RELEASE_CAPSULE | Freq: Every day | ORAL | 3 refills | Status: DC

## 2021-10-25 MED ORDER — ARMOUR THYROID 30 MG PO TABS: 30.0000 mg | ORAL_TABLET | Freq: Every day | ORAL | 3 refills | Status: DC

## 2021-10-25 MED ORDER — DOXYCYCLINE HYCLATE 100 MG PO TABS: 100.0000 mg | ORAL_TABLET | Freq: Two times a day (BID) | ORAL | 0 refills | Status: DC

## 2021-10-25 MED ORDER — HALCINONIDE 0.1 % EX CREA: TOPICAL_CREAM | CUTANEOUS | 1 refills | Status: DC

## 2021-10-25 MED ORDER — GABAPENTIN 100 MG PO CAPS: ORAL_CAPSULE | ORAL | 0 refills | Status: DC

## 2021-10-25 MED ORDER — ALBUTEROL SULFATE HFA 108 (90 BASE) MCG/ACT IN AERS: 1.0000 | INHALATION_SPRAY | RESPIRATORY_TRACT | 5 refills | Status: DC | PRN

## 2021-10-25 MED ORDER — GEMFIBROZIL 600 MG PO TABS: 600.0000 mg | ORAL_TABLET | Freq: Two times a day (BID) | ORAL | 3 refills | Status: DC

## 2021-10-25 MED ORDER — FLUTICASONE PROPIONATE 50 MCG/ACT NA SUSP: 1.0000 | Freq: Two times a day (BID) | NASAL | 3 refills | Status: DC

## 2021-10-25 MED ORDER — FLUTICASONE-SALMETEROL 250-50 MCG/ACT IN AEPB: 1.0000 | INHALATION_SPRAY | Freq: Two times a day (BID) | RESPIRATORY_TRACT | 3 refills | Status: AC

## 2021-10-25 MED ORDER — DICLOFENAC SODIUM 1 % EX GEL: CUTANEOUS | 2 refills | Status: DC

## 2021-10-25 MED ORDER — IBUPROFEN 800 MG PO TABS: 800.0000 mg | ORAL_TABLET | ORAL | 3 refills | Status: DC | PRN

## 2021-10-25 NOTE — Progress Notes (Unsigned)
This visit occurred during the SARS-CoV-2 public health emergency.  Safety protocols were in place, including screening questions prior to the visit, additional usage of staff PPE, and extensive cleaning of exam room while observing appropriate contact time as indicated for disinfecting solutions.  CPE- See plan.  Routine anticipatory guidance given to patient.  See health maintenance.  The possibility exists that previously documented standard health maintenance information may have been brought forward from a previous encounter into this note.  If needed, that same information has been updated to reflect the current situation based on today's encounter.    Tetanus 2018 Flu 2022 PNA not due Shingles 2021 covid vaccine done Pap not due.  S/p hysterectomy.   Mammogram 2020, d/w pt.  See avs.   DXA not due.   Diet and exercise d/w pt.  She is working on both.   Living will d/w pt.  Would have her husband designated if patient were incapacitated.   Her sister died and I gave my condolences.    She has cold for about 1 month, now with some ear congestion.  No fevers.  No vomiting.  Some occ green rhinorrhea.   She has sig cost with symbicort but it helps.  Still on singulair.  Rare SABA use.  Some occ cough.    Hypothyroidism.  No ADE on med.  Compliant.  TSh wnl.  No neck mass.   Hypertension:    Using medication without problems or lightheadedness: yes Chest pain with exertion: no Edema: some BLE edema at baseline.   Short of breath: no  Elevated Cholesterol: Using medications without problems: yes Muscle aches: no Diet compliance: d/w pt.  Exercise: d/w pt.    Migraine d/w pt.  No recent events. D/w pt.    Neuropathy pain is better with gabapentin.    PMH and SH reviewed  Meds, vitals, and allergies reviewed.   ROS: Per HPI.  Unless specifically indicated otherwise in HPI, the patient denies:  General: fever. Eyes: acute vision changes ENT: sore throat Cardiovascular:  chest pain Respiratory: SOB GI: vomiting GU: dysuria Musculoskeletal: acute back pain Derm: acute rash Neuro: acute motor dysfunction Psych: worsening mood Endocrine: polydipsia Heme: bleeding Allergy: hayfever  GEN: nad, alert and oriented HEENT: mucous membranes moist NECK: supple w/o LA CV: rrr. PULM: ctab, no inc wob ABD: soft, +bs EXT: no edema SKIN: no acute rash

## 2021-10-25 NOTE — Patient Instructions (Addendum)
You can call for a mammogram at [facility] at [facility].  ?[place] ?[phone] ? ?Start the antibiotics and prednisone.  Update me as needed, if not better.  ?Change to advair. Rinse after use.  ? ?Take care.  Glad to see you. ?

## 2021-10-31 NOTE — Assessment & Plan Note (Signed)
Continue carvedilol Lasix hydrochlorothiazide and potassium. ?

## 2021-10-31 NOTE — Assessment & Plan Note (Signed)
With your congestion and green rhinorrhea.  Given duration would start prednisone and doxycycline with routine cautions.  She can update me as needed. ?

## 2021-10-31 NOTE — Assessment & Plan Note (Signed)
Living will d/w pt. Would have her husband designated if patient were incapacitated. 

## 2021-10-31 NOTE — Assessment & Plan Note (Signed)
Clearly improved with Symbicort.  Okay to change to Advair given cost with Symbicort.  Continue Singulair.  Rare albuterol use.  She will update me as needed. ?

## 2021-10-31 NOTE — Assessment & Plan Note (Signed)
No ADE on med.  Compliant.  TSh wnl.  No neck mass.  Continue [place] Thyroid. ?

## 2021-10-31 NOTE — Assessment & Plan Note (Signed)
Continue gabapentin.  No adverse effect on medication. ?

## 2021-10-31 NOTE — Assessment & Plan Note (Signed)
Tetanus 2018 ?Flu 2022 ?PNA not due ?Shingles 2021 ?covid vaccine done ?Pap not due.  S/p hysterectomy.   ?Mammogram 2020, d/w pt.  See avs.   ?DXA not due.   ?Diet and exercise d/w pt.  She is working on both.   ?Living will d/w pt.  Would have her husband designated if patient were incapacitated.  ?

## 2021-10-31 NOTE — Assessment & Plan Note (Signed)
Continue gemfibrozil.  Continue work on diet and exercise. ?

## 2021-12-18 ENCOUNTER — Other Ambulatory Visit: Payer: Self-pay | Admitting: Family Medicine

## 2021-12-18 NOTE — Telephone Encounter (Signed)
Refill request for GABAPENTIN 100 MG CAPSULE ? ?LOV - 10/25/21 ?Next OV - 10/31/22 ?Last refill - 10/25/21 #60/0 ? ?

## 2022-01-07 ENCOUNTER — Encounter: Payer: Self-pay | Admitting: Registered Nurse

## 2022-01-07 ENCOUNTER — Ambulatory Visit: Payer: Self-pay | Admitting: Registered Nurse

## 2022-01-07 VITALS — BP 136/86 | HR 77 | Temp 98.8°F

## 2022-01-07 DIAGNOSIS — J209 Acute bronchitis, unspecified: Secondary | ICD-10-CM

## 2022-01-07 DIAGNOSIS — J0101 Acute recurrent maxillary sinusitis: Secondary | ICD-10-CM

## 2022-01-07 DIAGNOSIS — Z79899 Other long term (current) drug therapy: Secondary | ICD-10-CM

## 2022-01-07 MED ORDER — PROMETHAZINE HCL 6.25 MG/5ML PO SYRP: 6.2500 mg | ORAL_SOLUTION | Freq: Every evening | ORAL | 0 refills | Status: DC | PRN

## 2022-01-07 MED ORDER — AMOXICILLIN-POT CLAVULANATE 875-125 MG PO TABS: 1.0000 | ORAL_TABLET | Freq: Two times a day (BID) | ORAL | 0 refills | Status: DC

## 2022-01-07 MED ORDER — PREDNISONE 10 MG PO TABS: ORAL_TABLET | ORAL | 0 refills | Status: AC

## 2022-01-07 NOTE — Patient Instructions (Signed)
Cough, Adult Coughing is a reflex that clears your throat and your airways (respiratory system). Coughing helps to heal and protect your lungs. It is normal to cough occasionally, but a cough that happens with other symptoms or lasts a long time may be a sign of a condition that needs treatment. An acute cough may only last 2-3 weeks, while a chronic cough may last 8 or more weeks. Coughing is commonly caused by: Infection of the respiratory systemby viruses or bacteria. Breathing in substances that irritate your lungs. Allergies. Asthma. Mucus that runs down the back of your throat (postnasal drip). Smoking. Acid backing up from the stomach into the esophagus (gastroesophageal reflux). Certain medicines. Chronic lung problems. Other medical conditions such as heart failure or a blood clot in the lung (pulmonary embolism). Follow these instructions at home: Medicines Take over-the-counter and prescription medicines only as told by your health care provider. Talk with your health care provider before you take a cough suppressant medicine. Lifestyle  Avoid cigarette smoke. Do not use any products that contain nicotine or tobacco, such as cigarettes, e-cigarettes, and chewing tobacco. If you need help quitting, ask your health care provider. Drink enough fluid to keep your urine pale yellow. Avoid caffeine. Do not drink alcohol if your health care provider tells you not to drink. General instructions  Pay close attention to changes in your cough. Tell your health care provider about them. Always cover your mouth when you cough. Avoid things that make you cough, such as perfume, candles, cleaning products, or campfire or tobacco smoke. If the air is dry, use a cool mist vaporizer or humidifier in your bedroom or your home to help loosen secretions. If your cough is worse at night, try to sleep in a semi-upright position. Rest as needed. Keep all follow-up visits as told by your health care  provider. This is important. Contact a health care provider if you: Have new symptoms. Cough up pus. Have a cough that does not get better after 2-3 weeks or gets worse. Cannot control your cough with cough suppressant medicines and you are losing sleep. Have pain that gets worse or pain that is not helped with medicine. Have a fever. Have unexplained weight loss. Have night sweats. Get help right away if: You cough up blood. You have difficulty breathing. Your heartbeat is very fast. These symptoms may represent a serious problem that is an emergency. Do not wait to see if the symptoms will go away. Get medical help right away. Call your local emergency services (911 in the [place] Do not drive yourself to the hospital. Summary Coughing is a reflex that clears your throat and your airways. It is normal to cough occasionally, but a cough that happens with other symptoms or lasts a long time may be a sign of a condition that needs treatment. Take over-the-counter and prescription medicines only as told by your health care provider. Always cover your mouth when you cough. Contact a health care provider if you have new symptoms or a cough that does not get better after 2-3 weeks or gets worse. This information is not intended to replace advice given to you by your health care provider. Make sure you discuss any questions you have with your health care provider. Document Revised: 08/23/2018 Document Reviewed: 08/23/2018 Elsevier Patient Education  [place]. Acute Bronchitis, Adult  Acute bronchitis is sudden inflammation of the main airways (bronchi) that come off the windpipe (trachea) in the lungs. The swelling causes the airways  to get smaller and make more mucus than normal. This can make it hard to breathe and can cause coughing or noisy breathing (wheezing). Acute bronchitis may last several weeks. The cough may last longer. Allergies, asthma, and exposure to smoke may make the  condition worse. What are the causes? This condition can be caused by germs and by substances that irritate the lungs, including: Cold and flu viruses. The most common cause of this condition is the virus that causes the common cold. Bacteria. This is less common. Breathing in substances that irritate the lungs, including: Smoke from cigarettes and other forms of tobacco. Dust and pollen. Fumes from household cleaning products, gases, or burned fuel. Indoor or outdoor air pollution. What increases the risk? The following factors may make you more likely to develop this condition: A weak body's defense system, also called the immune system. A condition that affects your lungs and breathing, such as asthma. What are the signs or symptoms? Common symptoms of this condition include: Coughing. This may bring up clear, yellow, or green mucus from your lungs (sputum). Wheezing. Runny or stuffy nose. Having too much mucus in your lungs (chest congestion). Shortness of breath. Aches and pains, including sore throat or chest. How is this diagnosed? This condition is usually diagnosed based on: Your symptoms and medical history. A physical exam. You may also have other tests, including tests to rule out other conditions, such as pneumonia. These tests include: A test of lung function. Test of a mucus sample to look for the presence of bacteria. Tests to check the oxygen level in your blood. Blood tests. Chest X-ray. How is this treated? Most cases of acute bronchitis clear up over time without treatment. Your health care provider may recommend: Drinking more fluids to help thin your mucus so it is easier to cough up. Taking inhaled medicine (inhaler) to improve air flow in and out of your lungs. Using a vaporizer or a humidifier. These are machines that add water to the air to help you breathe better. Taking a medicine that thins mucus and clears congestion (expectorant). Taking a medicine  that prevents or stops coughing (cough suppressant). It is notcommon to take an antibiotic medicine for this condition. Follow these instructions at home:  Take over-the-counter and prescription medicines only as told by your health care provider. Use an inhaler, vaporizer, or humidifier as told by your health care provider. Take two teaspoons (10 mL) of honey at bedtime to lessen coughing at night. Drink enough fluid to keep your urine pale yellow. Do not use any products that contain nicotine or tobacco. These products include cigarettes, chewing tobacco, and vaping devices, such as e-cigarettes. If you need help quitting, ask your health care provider. Get plenty of rest. Return to your normal activities as told by your health care provider. Ask your health care provider what activities are safe for you. Keep all follow-up visits. This is important. How is this prevented? To lower your risk of getting this condition again: Wash your hands often with soap and water for at least 20 seconds. If soap and water are not available, use hand sanitizer. Avoid contact with people who have cold symptoms. Try not to touch your mouth, nose, or eyes with your hands. Avoid breathing in smoke or chemical fumes. Breathing smoke or chemical fumes will make your condition worse. Get the flu shot every year. Contact a health care provider if: Your symptoms do not improve after 2 weeks. You have trouble coughing up  the mucus. Your cough keeps you awake at night. You have a fever. Get help right away if you: Cough up blood. Feel pain in your chest. Have severe shortness of breath. Faint or keep feeling like you are going to faint. Have a severe headache. Have a fever or chills that get worse. These symptoms may represent a serious problem that is an emergency. Do not wait to see if the symptoms will go away. Get medical help right away. Call your local emergency services (911 in the [place] Do not drive  yourself to the hospital. Summary Acute bronchitis is inflammation of the main airways (bronchi) that come off the windpipe (trachea) in the lungs. The swelling causes the airways to get smaller and make more mucus than normal. Drinking more fluids can help thin your mucus so it is easier to cough up. Take over-the-counter and prescription medicines only as told by your health care provider. Do not use any products that contain nicotine or tobacco. These products include cigarettes, chewing tobacco, and vaping devices, such as e-cigarettes. If you need help quitting, ask your health care provider. Contact a health care provider if your symptoms do not improve after 2 weeks. This information is not intended to replace advice given to you by your health care provider. Make sure you discuss any questions you have with your health care provider. Document Revised: 12/05/2020 Document Reviewed: 12/05/2020 Elsevier Patient Education  [place]. Sinus Infection, Adult A sinus infection, also called sinusitis, is inflammation of your sinuses. Sinuses are hollow spaces in the bones around your face. Your sinuses are located: Around your eyes. In the middle of your forehead. Behind your nose. In your cheekbones. Mucus normally drains out of your sinuses. When your nasal tissues become inflamed or swollen, mucus can become trapped or blocked. This allows bacteria, viruses, and fungi to grow, which leads to infection. Most infections of the sinuses are caused by a virus. A sinus infection can develop quickly. It can last for up to 4 weeks (acute) or for more than 12 weeks (chronic). A sinus infection often develops after a cold. What are the causes? This condition is caused by anything that creates swelling in the sinuses or stops mucus from draining. This includes: Allergies. Asthma. Infection from bacteria or viruses. Deformities or blockages in your nose or sinuses. Abnormal growths in the  nose (nasal polyps). Pollutants, such as chemicals or irritants in the air. Infection from fungi. This is rare. What increases the risk? You are more likely to develop this condition if you: Have a weak body defense system (immune system). Do a lot of swimming or diving. Overuse nasal sprays. Smoke. What are the signs or symptoms? The main symptoms of this condition are pain and a feeling of pressure around the affected sinuses. Other symptoms include: Stuffy nose or congestion that makes it difficult to breathe through your nose. Thick yellow or greenish drainage from your nose. Tenderness, swelling, and warmth over the affected sinuses. A cough that may get worse at night. Decreased sense of smell and taste. Extra mucus that collects in the throat or the back of the nose (postnasal drip) causing a sore throat or bad breath. Tiredness (fatigue). Fever. How is this diagnosed? This condition is diagnosed based on: Your symptoms. Your medical history. A physical exam. Tests to find out if your condition is acute or chronic. This may include: Checking your nose for nasal polyps. Viewing your sinuses using a device that has a light (endoscope).  Testing for allergies or bacteria. Imaging tests, such as an MRI or CT scan. In rare cases, a bone biopsy may be done to rule out more serious types of fungal sinus disease. How is this treated? Treatment for a sinus infection depends on the cause and whether your condition is chronic or acute. If caused by a virus, your symptoms should go away on their own within 10 days. You may be given medicines to relieve symptoms. They include: Medicines that shrink swollen nasal passages (decongestants). A spray that eases inflammation of the nostrils (topical intranasal corticosteroids). Rinses that help get rid of thick mucus in your nose (nasal saline washes). Medicines that treat allergies (antihistamines). Over-the-counter pain relievers. If  caused by bacteria, your health care provider may recommend waiting to see if your symptoms improve. Most bacterial infections will get better without antibiotic medicine. You may be given antibiotics if you have: A severe infection. A weak immune system. If caused by narrow nasal passages or nasal polyps, surgery may be needed. Follow these instructions at home: Medicines Take, use, or apply over-the-counter and prescription medicines only as told by your health care provider. These may include nasal sprays. If you were prescribed an antibiotic medicine, take it as told by your health care provider. Do not stop taking the antibiotic even if you start to feel better. Hydrate and humidify  Drink enough fluid to keep your urine pale yellow. Staying hydrated will help to thin your mucus. Use a cool mist humidifier to keep the humidity level in your home above 50%. Inhale steam for 10-15 minutes, 3-4 times a day, or as told by your health care provider. You can do this in the bathroom while a hot shower is running. Limit your exposure to cool or dry air. Rest Rest as much as possible. Sleep with your head raised (elevated). Make sure you get enough sleep each night. General instructions  Apply a warm, moist washcloth to your face 3-4 times a day or as told by your health care provider. This will help with discomfort. Use nasal saline washes as often as told by your health care provider. Wash your hands often with soap and water to reduce your exposure to germs. If soap and water are not available, use hand sanitizer. Do not smoke. Avoid being around people who are smoking (secondhand smoke). Keep all follow-up visits. This is important. Contact a health care provider if: You have a fever. Your symptoms get worse. Your symptoms do not improve within 10 days. Get help right away if: You have a severe headache. You have persistent vomiting. You have severe pain or swelling around your face  or eyes. You have vision problems. You develop confusion. Your neck is stiff. You have trouble breathing. These symptoms may be an emergency. Get help right away. Call 911. Do not wait to see if the symptoms will go away. Do not drive yourself to the hospital. Summary A sinus infection is soreness and inflammation of your sinuses. Sinuses are hollow spaces in the bones around your face. This condition is caused by nasal tissues that become inflamed or swollen. The swelling traps or blocks the flow of mucus. This allows bacteria, viruses, and fungi to grow, which leads to infection. If you were prescribed an antibiotic medicine, take it as told by your health care provider. Do not stop taking the antibiotic even if you start to feel better. Keep all follow-up visits. This is important. This information is not intended to replace advice  given to you by your health care provider. Make sure you discuss any questions you have with your health care provider. Document Revised: 07/09/2021 Document Reviewed: 07/09/2021 Elsevier Patient Education  [place]. Sinus Pain  Sinus pain may occur when your sinuses become clogged or swollen. Sinuses are air-filled spaces in your skull that are behind the bones of your face and forehead. Sinus pain can range from mild to severe. What are the causes? Sinus pain can result from various conditions that affect the sinuses. Common causes include: Colds. Sinus infections. Allergies. What are the signs or symptoms? The main symptom of this condition is pain or pressure in your face, forehead, ears, or upper teeth. People who have sinus pain often have other symptoms, such as: Congested or runny nose. Fever. Inability to smell. Headache. Weather changes can make symptoms worse. How is this diagnosed? Your health care provider will diagnose this condition based on your symptoms and a physical exam. If you have pain that keeps coming back or does not go  away, your health care provider may recommend more testing. This may include: Imaging tests, such as a CT scan or MRI, to check for problems with your sinuses. Examination of your sinuses using a thin tool with a camera that is inserted through your nose (endoscopy). How is this treated? Treatment for this condition depends on the cause. Sinus pain that is caused by a sinus infection may be treated with antibiotic medicine. Sinus pain that is caused by congestion may be helped by rinsing out (flushing) the nose and sinuses with saline solution. Sinus pain that is caused by allergies may be helped by allergy medicines (antihistamines) and medicated nasal sprays. Sinus surgery may be needed in some cases if other treatments do not help. Follow these instructions at home: General instructions If directed: Apply a warm, moist washcloth to your face to help relieve pain. Use a nasal saline wash. Follow the directions on the bottle or box. Hydrate and humidify Drink enough water to keep your urine clear or pale yellow. Staying hydrated will help to thin your mucus. Use a humidifier if your home is dry. Inhale steam for 10-15 minutes, 3-4 times a day or as told by your health care provider. You can do this in the bathroom while a hot shower is running. Limit your exposure to cool or dry air. Medicines  Take over-the-counter and prescription medicines only as told by your health care provider. If you were prescribed an antibiotic medicine, take it as told by your health care provider. Do not stop taking the antibiotic even if you start to feel better. If you have congestion, use a nasal spray to help lessen pressure. Contact a health care provider if: You have sinus pain more than one time a week. You have sensitivity to light or sound. You develop a fever. You feel nauseous or you vomit. Your sinus pain or headache does not get better with treatment. Get help right away if: You have vision  problems. You have sudden, severe pain in your face or head. You have a seizure. You are confused. You have a stiff neck. Summary Sinus pain occurs when your sinuses become clogged or swollen. Sinus pain can result from various conditions that affect the sinuses, such as a cold, a sinus infection, or an allergy. Treatment for this condition depends on the cause. It may include medicine, such as antibiotics or antihistamines. This information is not intended to replace advice given to you by your  health care provider. Make sure you discuss any questions you have with your health care provider. Document Revised: 07/07/2021 Document Reviewed: 07/07/2021 Elsevier Patient Education  [place]. How to Perform a Sinus Rinse A sinus rinse is a home treatment that is used to rinse your sinuses with a germ-free (sterile) mixture of salt and water (saline solution). Sinuses are air-filled spaces in your skull that are behind the bones of your face and forehead. They open into your nasal cavity. A sinus rinse can help to clear mucus, dirt, dust, or pollen from your nasal cavity. You may do a sinus rinse when you have a cold, a virus, nasal allergy symptoms, a sinus infection, or stuffiness in your nose or sinuses. What are the risks? A sinus rinse is generally safe and effective. However, there are a few risks, which include: A burning sensation in your sinuses. This may happen if you do not make the saline solution as directed. Be sure to follow all directions when making the saline solution. Nasal irritation. Infection. This may be from unclean supplies or from contaminated water. Infection from contaminated water is rare, but possible. Do not do a sinus rinse if you have had ear or nasal surgery, ear infection, or plugged ears, unless recommended by your health care provider. Supplies needed: Saline solution or powder. Distilled or sterile water to mix with saline powder. You may use boiled  and cooled tap water. Boil tap water for 5 minutes; cool until it is lukewarm. Use within 24 hours. Do not use regular tap water to mix with the saline solution. Neti pot or nasal rinse bottle. These supplies release the saline solution into your nose and through your sinuses. Neti pots and nasal rinse bottles can be purchased at [employer], a health food store, or online. How to perform a sinus rinse  Wash your hands with soap and water for at least 20 seconds. If soap and water are not available, use hand sanitizer. Wash your device according to the directions that came with the product and then dry it. Use the solution that comes with your product or one that is sold separately in stores. Follow the mixing directions on the package to mix with sterile or distilled water. Fill the device with the amount of saline solution noted in the device instructions. Stand by a sink and tilt your head sideways over the sink. Place the spout of the device in your upper nostril (the one closer to the ceiling). Gently pour or squeeze the saline solution into your nasal cavity. The liquid should drain out from the lower nostril if you are not too congested. While rinsing, breathe through your open mouth. Gently blow your nose to clear any mucus and rinse solution. Blowing too hard may cause ear pain. Turn your head in the other direction and repeat in your other nostril. Clean and rinse your device with clean water and then air-dry it. Talk with your health care provider or pharmacist if you have questions about how to do a sinus rinse. Summary A sinus rinse is a home treatment that is used to rinse your sinuses with a sterile mixture of salt and water (saline solution). You may do a sinus rinse when you have a cold, a virus, nasal allergy symptoms, a sinus infection, or stuffiness in your nose or sinuses. A sinus rinse is generally safe and effective. Follow all instructions carefully. This  information is not intended to replace advice given to you by your  health care provider. Make sure you discuss any questions you have with your health care provider. Document Revised: 01/21/2021 Document Reviewed: 01/21/2021 Elsevier Patient Education  [place].

## 2022-01-07 NOTE — Progress Notes (Signed)
Subjective:    Patient ID: [name], female    DOB: 09-Feb-1961, 61 y.o.   MRN: [id number]  61y/o caucasian established female pt c/o sinusitis x1 week. States she starts smelling cigarettes when she has a sinus infection and this began over the weekend for her. C/o maxillary sinus pressure, upper dental pain, ears feeling full, nasal congestion and rhinorrhea. She has been taking her typical allergy meds and inhalers.   Has one dose left in her promethazine for cough liquid would like refill.  Cough has been worsening with sinus infection.  Mucous yellow/green productive.  Denied fever/chills/n/v/d/rash.  Patient feels like she needs prednisone and antibiotics.  Patient also asked if she needs referral for [facility] provider to have second opinion evaluation of her chronic cough.  Started on Lasix in combination with HCTZ in March 2023 by pcp. No f/u scheduled until March 2024.  Patient asking if lasix and potassium available through clinic formulary dispensary or if she needs to continue pick up at [facility].    Patient asked for refill [facility] gemfibrizol 659m po BID #180, hydrochlorothiazide 12.575mpo daily prn leg swelling #90, ibuprofen 80053mo TID prn pain #90, omeprazole 55m44m po daily #90     Review of Systems  Constitutional:  Positive for fatigue. Negative for activity change, appetite change, chills, diaphoresis and fever.  HENT:  Positive for congestion, ear pain, postnasal drip, rhinorrhea, sinus pressure, sinus pain and voice change. Negative for dental problem, ear discharge, sore throat, tinnitus and trouble swallowing.   Eyes:  Negative for photophobia and visual disturbance.  Respiratory:  Positive for cough and chest tightness. Negative for shortness of breath and stridor.   Cardiovascular:  Negative for chest pain.  Gastrointestinal:  Negative for abdominal pain, diarrhea, nausea and vomiting.  Endocrine: Negative for cold intolerance and heat intolerance.   Genitourinary:  Negative for difficulty urinating.  Musculoskeletal:  Negative for gait problem, neck pain and neck stiffness.  Skin:  Negative for rash.  Allergic/Immunologic: Positive for environmental allergies. Negative for food allergies.  Neurological:  Negative for dizziness, tremors, seizures, syncope, facial asymmetry, speech difficulty, weakness, light-headedness, numbness and headaches.  Hematological:  Negative for adenopathy. Does not bruise/bleed easily.  Psychiatric/Behavioral:  Positive for sleep disturbance. Negative for agitation and confusion.       Objective:   Physical Exam Vitals and nursing note reviewed.  Constitutional:      General: She is awake. She is not in acute distress.    Appearance: Normal appearance. She is well-developed and well-groomed. She is obese. She is not ill-appearing, toxic-appearing or diaphoretic.  HENT:     Head: Normocephalic and atraumatic.     Jaw: There is normal jaw occlusion. No trismus.     Salivary Glands: Right salivary gland is not diffusely enlarged or tender. Left salivary gland is not diffusely enlarged or tender.     Right Ear: Hearing, ear canal and external ear normal. A middle ear effusion is present. There is no impacted cerumen.     Left Ear: Hearing, ear canal and external ear normal. A middle ear effusion is present. There is no impacted cerumen.     Nose: Mucosal edema, congestion and rhinorrhea present. No nasal deformity, septal deviation or laceration. Rhinorrhea is clear.     Right Turbinates: Enlarged and swollen. Not pale.     Left Turbinates: Enlarged and swollen. Not pale.     Right Sinus: Maxillary sinus tenderness present. No frontal sinus tenderness.  Left Sinus: Maxillary sinus tenderness present. No frontal sinus tenderness.     Mouth/Throat:     Lips: Pink. No lesions.     Mouth: Mucous membranes are moist. Mucous membranes are not pale, not dry and not cyanotic. No lacerations, oral lesions or  angioedema.     Dentition: No dental abscesses or gum lesions.     Tongue: No lesions. Tongue does not deviate from midline.     Palate: No mass and lesions.     Pharynx: Uvula midline. Pharyngeal swelling and posterior oropharyngeal erythema present. No oropharyngeal exudate or uvula swelling.     Tonsils: No tonsillar exudate or tonsillar abscesses. 0 on the right. 0 on the left.     Comments: Cobblestoning posterior pharynx; bilateral TMs air fluid level clear; bilateral allergic shiners; lower eyelids nonpitting edema 1+/4; nasal turbinates edema erythema clear discharge bilaterally Eyes:     General: Lids are normal. Vision grossly intact. Gaze aligned appropriately. Allergic shiner present. No scleral icterus.       Right eye: No foreign body, discharge or hordeolum.        Left eye: No foreign body, discharge or hordeolum.     Extraocular Movements: Extraocular movements intact.     Right eye: Normal extraocular motion and no nystagmus.     Left eye: Normal extraocular motion and no nystagmus.     Conjunctiva/sclera: Conjunctivae normal.     Right eye: Right conjunctiva is not injected. No chemosis, exudate or hemorrhage.    Left eye: Left conjunctiva is not injected. No chemosis, exudate or hemorrhage.    Pupils: Pupils are equal, round, and reactive to light. Pupils are equal.     Right eye: Pupil is round and reactive.     Left eye: Pupil is round and reactive.  Neck:     Thyroid: No thyroid mass or thyromegaly.     Trachea: Trachea and phonation normal. No tracheal tenderness or tracheal deviation.  Cardiovascular:     Rate and Rhythm: Normal rate and regular rhythm.     Pulses:          Radial pulses are 2+ on the right side and 2+ on the left side.     Heart sounds: Normal heart sounds, S1 normal and S2 normal. Heart sounds not distant. No murmur heard. Pulmonary:     Effort: Pulmonary effort is normal. No accessory muscle usage or respiratory distress.     Breath sounds:  Normal breath sounds and air entry. No stridor or decreased air movement. No decreased breath sounds, wheezing, rhonchi or rales.     Comments: Nasal congestion noted when speaking; frequent cough nonproductive in exam room; spoke full sentences without difficulty between coughing Chest:     Chest wall: No tenderness.  Abdominal:     General: There is no distension.     Palpations: Abdomen is soft.  Musculoskeletal:        General: No tenderness. Normal range of motion.     Right hand: No lacerations. Normal range of motion. Normal strength. Normal capillary refill.     Left hand: No lacerations. Normal range of motion. Normal strength. Normal capillary refill.     Cervical back: Normal range of motion and neck supple. No swelling, edema, deformity, erythema, signs of trauma, lacerations, rigidity, spasms, tenderness or crepitus. No pain with movement. Normal range of motion.     Thoracic back: No swelling, edema, deformity, signs of trauma, lacerations or spasms.     Lumbar back:  No swelling, edema, deformity, signs of trauma, lacerations or spasms.     Right hip: Normal.     Left hip: Normal.     Right knee: Normal.     Left knee: Normal.  Lymphadenopathy:     Head:     Right side of head: No submental, submandibular, tonsillar, preauricular, posterior auricular or occipital adenopathy.     Left side of head: No submental, submandibular, tonsillar, preauricular, posterior auricular or occipital adenopathy.     Cervical: No cervical adenopathy.     Right cervical: No superficial, deep or posterior cervical adenopathy.    Left cervical: No superficial, deep or posterior cervical adenopathy.  Skin:    General: Skin is warm and dry.     Capillary Refill: Capillary refill takes less than 2 seconds.     Coloration: Skin is not ashen, cyanotic, jaundiced, mottled, pale or sallow.     Findings: No abrasion, abscess, acne, bruising, burn, ecchymosis, erythema, signs of injury, laceration,  lesion, petechiae, rash or wound.     Nails: There is no clubbing.  Neurological:     Mental Status: She is alert and oriented to person, place, and time. She is not disoriented.     GCS: GCS eye subscore is 4. GCS verbal subscore is 5. GCS motor subscore is 6.     Cranial Nerves: Cranial nerves 2-12 are intact. No cranial nerve deficit, dysarthria or facial asymmetry.     Sensory: Sensation is intact. No sensory deficit.     Motor: Motor function is intact. No weakness, tremor, atrophy, abnormal muscle tone or seizure activity.     Coordination: Coordination is intact. Coordination normal.     Gait: Gait is intact. Gait normal.     Comments: In/out of chair and on/off exam table without difficulty; gait sure and steady in clinic  Psychiatric:        Attention and Perception: Attention and perception normal.        Mood and Affect: Mood and affect normal.        Speech: Speech normal.        Behavior: Behavior normal. Behavior is cooperative.        Thought Content: Thought content normal.        Cognition and Memory: Cognition and memory normal.        Judgment: Judgment normal.          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  Prefers to work remote from home but employer requiring onsite work one day per week.  Dispensed from [facility] to patient prednisone taper 45m po daily with breakfast 284mx 5 day/1057mdays/5mg x 2 days #21 RF0.  Previously taken without side effects and good relief sinusitis pain/pressure/rhinitis.  Continue singulair 26m55m qhs,  flonase 1 spray each nostril BID, increase frequency of nasal saline 2 sprays each nostril q2h wa prn congestion.  Given 1 bottle from clinic stock  If no improvement with 48 hours of increased frequency saline, prednisone, singulair,  antihistamine, bedtime shower and flonase use start augmentin 875mg92mBID x 10 days #20 RF0 dispensed from [facility] to patient  Denied personal or family history of ENT cancer.  Shower BID especially prior to bed. No evidence of systemic bacterial infection, non toxic and well hydrated.  I do not see where any  further testing or imaging is necessary at this time.   I will suggest supportive care, rest, good hygiene and encourage the patient to take adequate fluids.  The patient is to return to clinic or EMERGENCY ROOM if symptoms worsen or change significantly.   Consider ENT evaluation if no improvement in cough with treatment of bronchitis/sinusitis.  Discussed with patient if she has met deductibles/copays for the year second opinion evaluation and testing may be less expensive/out of pocket if done before end of fiscal year with her insurance versus waiting until 1 Oct (her current plan) Exitcare handout on sinusitis and sinus rinse.  Patient verbalized agreement and understanding of treatment plan and had no further questions at this time.   P2:  Hand washing and cover cough  Electrolyte and kidney function check as taking both lasix and hydrochlorothiazide for the previous month along with acute sinusitis/bronchitis.  Sample sent to [facility] today will send my chart message or call patient tomorrow with results. Dispensed from  [facility] to patient today gemfibrizol 646m po BID #180, hydrochlorothiazide 12.564mpo daily prn leg swelling #90, ibuprofen 80060mo TID prn pain #90, omeprazole 95m61m po daily #90 take with food.  Lasix 95mg54milable in clinic would need to split in half for her dosing.  Potassium only 20meq41mformulary large tablets.  Patient stated she still has lasix and potassium available at home at this time.  Patient verbalized understanding information/instructions and had no further questions at this time.   Electronic Rx to her pharmacy of choice refill promethazine 6.25mg/545mig 5ml po 20ms prn cough #120 RF0.  May use OTC cough lozenges po per manufacturer instructions.  Prednisone taper 10mg (2062md, 53mx2d, 516mdays) 15mly with breakfast #21 RF0 dispensed from [facility].   Start augmentin 875mg po BID 45m days #20 RF0 dispensed from [facility] if tan/pink opaque productive cough/mucous.   Discussed possible side effects increased/decreased appetite, difficulty sleeping, increased blood sugar, increased blood pressure and heart rate.  Albuterol MDI 90mcg 1-2 puf12mo q4-6h prn protracted cough/wheeze side effect increased heart rate. Bronchitis simple, community acquired, may have started as viral (probably respiratory syncytial, parainfluenza, influenza, or adenovirus), but now evidence of acute purulent bronchitis with resultant bronchial edema and mucus formation.  Viruses are the most common cause of bronchial inflammation in otherwise healthy adults with acute bronchitis.  The appearance of sputum is not predictive of whether a bacterial infection is present.  Purulent sputum is most often caused by viral infections.  There are a small portion of those caused by non-viral agents being Mycoplama pneumonia.  Microscopic examination or C&S of sputum in the healthy adult with acute bronchitis is generally not helpful (usually negative or normal respiratory flora) other considerations being cough from upper respiratory tract infections, sinusitis or allergic syndromes (mild asthma or viral pneumonia).  Differential Diagnoses:  reactive airway disease (asthma, allergic aspergillosis (eosinophilia), chronic bronchitis, respiratory infection (sinusitis, common cold, pneumonia), congestive heart failure, reflux esophagitis, bronchogenic tumor, aspiration syndromes and/or exposure to pulmonary irritants/smoke.    Without high fever, severe dyspnea, lack of physical findings or other risk factors, I will hold on a chest radiograph and CBC at this time.  I discussed that approximately 50% of patients with  acute bronchitis have a cough that lasts up to three weeks, and 25% for over a month.  Tylenol 500mg one to tw27mblets every four to six hours as needed for fever or myalgias.  No aspirin  ER if hemopthysis, SOB,  worst chest pain of life.   Patient instructed to follow up in one week or sooner if symptoms worsen.  Patient verbalized agreement and understanding of treatment plan.  P2:  hand washing and cover cough

## 2022-01-10 LAB — BASIC METABOLIC PANEL
BUN/Creatinine Ratio: 16 (ref 9–23)
BUN: 11 mg/dL (ref 6–24)
CO2: 21 mmol/L (ref 20–29)
Calcium: 9.8 mg/dL (ref 8.7–10.2)
Chloride: 98 mmol/L (ref 96–106)
Creatinine, Ser: 0.7 mg/dL (ref 0.57–1.00)
Glucose: 120 mg/dL — ABNORMAL HIGH (ref 70–99)
Potassium: 4 mmol/L (ref 3.5–5.2)
Sodium: 140 mmol/L (ref 134–144)
eGFR: 100 mL/min/{1.73_m2} (ref >59)

## 2022-01-13 ENCOUNTER — Encounter: Payer: Self-pay | Admitting: Registered Nurse

## 2022-01-28 ENCOUNTER — Encounter: Payer: Self-pay | Admitting: Cardiovascular Disease

## 2022-01-28 ENCOUNTER — Ambulatory Visit: Payer: No Typology Code available for payment source | Admitting: Cardiovascular Disease

## 2022-01-28 VITALS — BP 110/80 | HR 81 | Ht 66.0 in | Wt 294.5 lb

## 2022-01-28 DIAGNOSIS — E782 Mixed hyperlipidemia: Secondary | ICD-10-CM | POA: Diagnosis not present

## 2022-01-28 DIAGNOSIS — R609 Edema, unspecified: Secondary | ICD-10-CM | POA: Diagnosis not present

## 2022-01-28 DIAGNOSIS — R079 Chest pain, unspecified: Secondary | ICD-10-CM | POA: Insufficient documentation

## 2022-01-28 DIAGNOSIS — I10 Essential (primary) hypertension: Secondary | ICD-10-CM

## 2022-01-28 NOTE — Progress Notes (Signed)
Cardiology Office Note  Date:  01/28/2022   ID:  [name], DOB 1961-06-08, MRN [id number]  PCP:  Tonia Ghent, MD   Chief Complaint  Patient presents with   Follow-up    Patient c/o bilateral LE edema with more on the left, cough and chest discomfort that comes and goes. Medications reviewed by the patient verbally.     HPI:  [name] is a 61 year old woman with history of Morbid obesity Chronic cough, >4 years, bronchospastic Heart catheterization 2002 with Dr. Humphrey Rolls For abn ekg, tried nuclear stress, ) Who presents for f/u of his cough and leg edema, SOB with walking  Last seen by myself in clinic February 2022  In follow-up she reports that she is taking Lasix daily  HCTZ QOD for stable leg swelling Potassium 2 a day Works a Network engineer job, recently bought compression hose No regular exercise program, interested in losing weight  Continues to have chronic cough, better with cinnamon candy  Atypical chest pain left chest, at rest  Lab work reviewed Slow trend up in A1c now 6.1 Total cholesterol 182  EKG personally reviewed by myself on todays visit Normal sinus rhythm rate 81 bpm no significant ST-T wave changes  Other past medical history reviewed Echo 03/2020  1. Left ventricular ejection fraction, by estimation, is 60 to 65%. The  left ventricle has normal function. The left ventricle has no regional  wall motion abnormalities. Left ventricular diastolic parameters are  consistent with Grade II diastolic  dysfunction (pseudonormalization).   2. Right ventricular systolic function is normal. The right ventricular  size is normal. There is normal pulmonary artery systolic pressure. The  estimated right ventricular systolic pressure is 04.8 mmHg.   Prior cardiac catheterization 2002 normal coronary arteries   PMH:   has a past medical history of Anxiety, History of colon polyps, History of eosinophilia, Hyperlipidemia, Hypertension, Hypothyroidism,  Migraine, Neuropathy, Nocturia, Pelvic prolapse, Seasonal allergies, Urgency of urination, Vitamin D deficiency, and Wears glasses.  PSH:    Past Surgical History:  Procedure Laterality Date   ABDOMINAL HYSTERECTOMY  2001   w/  Bladder Suspension   ANTERIOR AND POSTERIOR REPAIR WITH SACROSPINOUS FIXATION N/A 07/24/2014   Procedure: SACROSPINOUS LIGAMENT SUSPENSION ;  Surgeon: Maisie Fus, MD;  Location: [facility];  Service: Gynecology;  Laterality: N/A;   COLONOSCOPY W/ POLYPECTOMY  04-25-2014   LAPAROSCOPIC CHOLECYSTECTOMY  2008   VAGINAL HYSTERECTOMY N/A 07/24/2014   Procedure: RESECTION OF CERVIX ;  Surgeon: Maisie Fus, MD;  Location: [facility];  Service: Gynecology;  Laterality: N/A;    Current Outpatient Medications  Medication Sig Dispense Refill   albuterol (VENTOLIN HFA) 108 (90 Base) MCG/ACT inhaler Inhale 1-2 puffs into the lungs every 4 (four) hours as needed for up to 10 days for wheezing or shortness of breath. 18 each 5   ARMOUR THYROID 30 MG tablet Take 1 tablet (30 mg total) by mouth daily with breakfast. 90 tablet 3   benzonatate (TESSALON) 200 MG capsule Take 200 mg by mouth 3 (three) times daily as needed.     carvedilol (COREG) 6.25 MG tablet Take 1 tablet (6.25 mg total) by mouth 2 (two) times daily with a meal. 180 tablet 3   Cholecalciferol (VITAMIN D3) 2000 UNITS TABS Take 6,000 Units by mouth daily. Reported on 10/29/2015     Chromium-Cinnamon (CINNAMON PLUS CHROMIUM) [phone] MCG-MG CAPS      diclofenac Sodium (VOLTAREN) 1 % GEL APPLY  2 GRAMS TOPICALLY FOUR TIMES A DAY AS NEEDED 100 g 2   fluticasone (FLONASE) 50 MCG/ACT nasal spray Place 1 spray into both nostrils 2 (two) times daily. 48 g 3   fluticasone-salmeterol (ADVAIR DISKUS) 250-50 MCG/ACT AEPB Inhale 1 puff into the lungs in the morning and at bedtime. 3 each 3   furosemide (LASIX) 20 MG tablet TAKE ONE TABLET BY MOUTH DAILY 90 tablet 3   gabapentin (NEURONTIN) 100 MG  capsule TAKE ONE CAPSULE BY MOUTH TWICE A DAY AS NEEDED 180 capsule 3   gabapentin (NEURONTIN) 300 MG capsule Take 3 capsules (900 mg total) by mouth at bedtime. 270 capsule 3   gemfibrozil (LOPID) 600 MG tablet Take 1 tablet (600 mg total) by mouth 2 (two) times daily before a meal. 180 tablet 3   Halcinonide (HALOG) 0.1 % CREA Apply to affected area twice a day as needed. 60 g 1   hydrochlorothiazide (MICROZIDE) 12.5 MG capsule TAKE 1 CAPSULE BY MOUTH DAILY AS NEEDED FOR LOWER EXTREMITY EDEMA. 90 capsule 3   ibuprofen (ADVIL) 800 MG tablet Take 1 tablet (800 mg total) by mouth as needed (with food). 90 tablet 3   Lutein 20 MG CAPS Take by mouth.     montelukast (SINGULAIR) 10 MG tablet Take 1 tablet (10 mg total) by mouth at bedtime. 90 tablet 3   Multiple Vitamin (MULTIVITAMIN) capsule Take 1 capsule by mouth daily.     omeprazole (PRILOSEC) 40 MG capsule Take 1 capsule (40 mg total) by mouth at bedtime. 90 capsule 3   Potassium 99 MG TABS Take 1-2 tablets (99-198 mg total) by mouth daily.     promethazine (PHENERGAN) 6.25 MG/5ML syrup Take 5 mLs (6.25 mg total) by mouth at bedtime as needed for up to 24 days for nausea or vomiting. 120 mL 0   Turmeric Curcumin 500 MG CAPS daily.      vitamin B-12 (CYANOCOBALAMIN) 1000 MCG tablet Take 1 tablet (1,000 mcg total) by mouth daily.     No current facility-administered medications for this visit.    Allergies:   Codeine   Social History:  The patient  reports that she quit smoking about 39 years ago. Her smoking use included cigarettes. She has a 1.00 pack-year smoking history. She has never used smokeless tobacco. She reports that she does not drink alcohol and does not use drugs.   Family History:   family history includes Breast cancer (age of onset: 38) in her sister; Hyperlipidemia in her mother.    Review of Systems: Review of Systems  Constitutional: Negative.   HENT: Negative.    Respiratory: Negative.    Cardiovascular:  Positive  for chest pain and leg swelling.  Gastrointestinal: Negative.   Musculoskeletal: Negative.   Neurological: Negative.   Psychiatric/Behavioral: Negative.    All other systems reviewed and are negative.   PHYSICAL EXAM: VS:  BP 110/80 (BP Location: Left Arm, Patient Position: Sitting, Cuff Size: Large)   Pulse 81   Ht '5\' 6"'$  (1.676 m)   Wt 294 lb 8 oz (133.6 kg)   SpO2 98%   BMI 47.53 kg/m  , BMI Body mass index is 47.53 kg/m. Constitutional:  oriented to person, place, and time. No distress.  HENT:  Head: Grossly normal Eyes:  no discharge. No scleral icterus.  Neck: No JVD, no carotid bruits  Cardiovascular: Regular rate and rhythm, no murmurs appreciated Pulmonary/Chest: Clear to auscultation bilaterally, no wheezes or rails Abdominal: Soft.  no distension.  no  tenderness.  Musculoskeletal: Normal range of motion Neurological:  normal muscle tone. Coordination normal. No atrophy Skin: Skin warm and dry Psychiatric: normal affect, pleasant   Recent Labs: 09/27/2021: ALT 25; Hemoglobin 12.6; Platelets 261.0; TSH 3.29 01/07/2022: BUN 11; Creatinine, Ser 0.70; Potassium 4.0; Sodium 140    Lipid Panel Lab Results  Component Value Date   CHOL 182 09/27/2021   HDL 40.50 09/27/2021   LDLCALC 93 09/27/2020   TRIG 219.0 (H) 09/27/2021      Wt Readings from Last 3 Encounters:  01/28/22 294 lb 8 oz (133.6 kg)  10/25/21 286 lb (129.7 kg)  05/27/21 285 lb (129.3 kg)      ASSESSMENT AND PLAN:  Problem List Items Addressed This Visit       Cardiology Problems   Hyperlipidemia   Essential hypertension - Primary   Relevant Orders   EKG 12-Lead     Other   Chest pain of uncertain etiology   Edema  Chronic cough Noncardiac in nature, managed by primary care Improved with cinnamon candy  Shortness of breath Normal echocardiogram, normal right heart pressures Recommend walking program for conditioning Discussed weight loss strategies  Morbid obesity We have  encouraged continued exercise, careful diet management in an effort to lose weight.  Lower extremity edema Stable with Lasix, also has HCTZ every other day Likely exacerbated by weight Stable BMP  Hypertension Blood pressure is well controlled on today's visit. No changes made to the medications.    Total encounter time more than 30 minutes  Greater than 50% was spent in counseling and coordination of care with the patient   Signed, Esmond Plants, M.D., Ph.D. [place], [place]

## 2022-01-28 NOTE — Patient Instructions (Signed)
Medication Instructions:  No changes  If you need a refill on your cardiac medications before your next appointment, please call your pharmacy.   Lab work: No new labs needed  Testing/Procedures: No new testing needed  Follow-Up: At [facility], you and your health needs are our priority.  As part of our continuing mission to provide you with exceptional heart care, we have created designated Provider Care Teams.  These Care Teams include your primary Cardiologist (physician) and Advanced Practice Providers (APPs -  Physician Assistants and Nurse Practitioners) who all work together to provide you with the care you need, when you need it.  You will need a follow up appointment in 12 months  Providers on your designated Care Team:   Christopher Berge, NP Ryan Dunn, PA-C Cadence Furth, PA-C  COVID-19 Vaccine Information can be found at: [email or web address] For questions related to vaccine distribution or appointments, please email [email or web address] or call [phone].   

## 2022-03-18 ENCOUNTER — Encounter: Payer: Self-pay | Admitting: Registered Nurse

## 2022-03-18 ENCOUNTER — Ambulatory Visit: Payer: Self-pay | Admitting: Registered Nurse

## 2022-03-18 VITALS — BP 124/88 | HR 78 | Temp 97.2°F | Resp 16

## 2022-03-18 DIAGNOSIS — L247 Irritant contact dermatitis due to plants, except food: Secondary | ICD-10-CM

## 2022-03-18 MED ORDER — DIPHENHYDRAMINE HCL 2 % EX GEL: 1.0000 | Freq: Two times a day (BID) | CUTANEOUS | Status: AC | PRN

## 2022-03-18 MED ORDER — HYDROCORTISONE 1 % EX LOTN: 1.0000 | TOPICAL_LOTION | Freq: Two times a day (BID) | CUTANEOUS | 0 refills | Status: AC

## 2022-03-18 MED ORDER — PREDNISONE 10 MG PO TABS: ORAL_TABLET | ORAL | 0 refills | Status: AC

## 2022-03-18 NOTE — Progress Notes (Signed)
Subjective:    Patient ID: [name], female    DOB: 10/21/1960, 61 y.o.   MRN: [id number]  61y/o established caucasian female was working in the garden this weekend and developed rash on hands bumps/blisters and has spread to arms/legs/face over the past couple of days.  Bridge of nose glasses rubbing rash so had to put bandaid over top.  Oral antihistamine not helping xyzal  Patient thinks she needs oral steroids.  Last poison ivy outbreak requiring steroids 7 years ago per patient.  Denied fever/chills/purulent discharge/dyspnea/shortness of breath/wheezing.  Did not see poison ivy/oak plants in her yard while working.      Review of Systems  Constitutional:  Negative for activity change, appetite change, chills, diaphoresis, fatigue and fever.  HENT:  Negative for sore throat, trouble swallowing and voice change.   Eyes:  Negative for photophobia and visual disturbance.  Respiratory:  Negative for cough, choking, chest tightness, shortness of breath and stridor.   Cardiovascular:  Negative for chest pain.  Gastrointestinal:  Negative for diarrhea, nausea and vomiting.  Endocrine: Negative for cold intolerance and heat intolerance.  Genitourinary:  Negative for difficulty urinating.  Musculoskeletal:  Negative for back pain, gait problem, neck pain and neck stiffness.  Skin:  Positive for color change and rash. Negative for pallor and wound.  Allergic/Immunologic: Positive for environmental allergies. Negative for food allergies.  Neurological:  Negative for dizziness, tremors, seizures, syncope, facial asymmetry, speech difficulty, weakness, light-headedness, numbness and headaches.  Hematological:  Negative for adenopathy. Does not bruise/bleed easily.  Psychiatric/Behavioral:  Positive for sleep disturbance. Negative for agitation and confusion.        Objective:   Physical Exam Vitals and nursing note reviewed.  Constitutional:      General: She is awake. She is not in  acute distress.    Appearance: Normal appearance. She is well-developed, well-groomed and overweight. She is not ill-appearing, toxic-appearing or diaphoretic.  HENT:     Head: Normocephalic and atraumatic.     Jaw: There is normal jaw occlusion.     Salivary Glands: Right salivary gland is not diffusely enlarged. Left salivary gland is not diffusely enlarged.     Right Ear: Hearing and external ear normal.     Left Ear: Hearing and external ear normal.     Nose: Nasal tenderness present. No laceration, congestion or rhinorrhea.     Right Turbinates: Not enlarged, swollen or pale.     Left Turbinates: Not enlarged, swollen or pale.      Comments: 1cm diameter erythema macular vesicular rash left later bridge of nose bandaid circular covers entirely clean dry and intact    Mouth/Throat:     Lips: Pink. No lesions.     Mouth: Mucous membranes are moist. No oral lesions or angioedema.     Dentition: No gum lesions.     Tongue: No lesions. Tongue does not deviate from midline.     Palate: No mass and lesions.     Pharynx: Oropharynx is clear. Uvula midline. No uvula swelling.     Tonsils: No tonsillar exudate or tonsillar abscesses.  Eyes:     General: Lids are normal. Vision grossly intact. Gaze aligned appropriately. Allergic shiner present. No scleral icterus.       Right eye: No discharge.        Left eye: No discharge.     Extraocular Movements: Extraocular movements intact.     Conjunctiva/sclera: Conjunctivae normal.     Pupils: Pupils are equal,  round, and reactive to light.  Neck:     Trachea: Trachea and phonation normal. No tracheal deviation.  Cardiovascular:     Rate and Rhythm: Normal rate and regular rhythm.     Pulses:          Radial pulses are 2+ on the right side and 2+ on the left side.  Pulmonary:     Effort: Pulmonary effort is normal.     Breath sounds: Normal breath sounds and air entry. No stridor, decreased air movement or transmitted upper airway sounds. No  decreased breath sounds or wheezing.     Comments: Spoke full sentences without difficulty; no cough observed in exam room Abdominal:     General: Abdomen is flat.  Musculoskeletal:        General: Normal range of motion.     Right upper arm: No swelling, edema, deformity, lacerations or tenderness.     Left upper arm: No swelling, edema, deformity, lacerations or tenderness.     Right forearm: Swelling and tenderness present. No edema, deformity or lacerations.     Left forearm: Swelling and tenderness present. No edema, deformity or lacerations.     Right wrist: No swelling, effusion, lacerations or tenderness.     Left wrist: No swelling, effusion, lacerations or tenderness.     Right hand: No swelling, deformity, lacerations, tenderness or bony tenderness. Normal range of motion. Normal strength. Normal sensation. There is no disruption of two-point discrimination. Normal capillary refill.     Left hand: Swelling and tenderness present. No deformity, lacerations or bony tenderness. Normal range of motion. Normal strength. Normal sensation. There is no disruption of two-point discrimination. Normal capillary refill.       Arms:     Cervical back: Normal range of motion and neck supple. No swelling, edema, deformity, erythema, signs of trauma, lacerations, rigidity, tenderness or crepitus. No pain with movement. Normal range of motion.     Thoracic back: No swelling, edema, deformity, signs of trauma, lacerations or tenderness. Normal range of motion.     Lumbar back: No swelling, edema, deformity, signs of trauma, lacerations or tenderness.     Right lower leg: Swelling and tenderness present. No deformity, lacerations or bony tenderness.     Left lower leg: Swelling and tenderness present. No deformity, lacerations or bony tenderness.     Comments: Scattered macular erythema nummular less than 2cm diameter with a few scattered papules and linear abrasions anterior forearms and bilateral  lower legs dry  Lymphadenopathy:     Head:     Right side of head: No submandibular or preauricular adenopathy.     Left side of head: No submandibular or preauricular adenopathy.     Cervical:     Right cervical: No superficial cervical adenopathy.    Left cervical: No superficial cervical adenopathy.  Skin:    General: Skin is warm and dry.     Capillary Refill: Capillary refill takes less than 2 seconds.     Coloration: Skin is not ashen, cyanotic, jaundiced, mottled, pale or sallow.     Findings: Abrasion and rash present. No abscess, acne, bruising, burn, ecchymosis, erythema, signs of injury, laceration, lesion, petechiae or wound. Rash is macular, papular and vesicular. Rash is not crusting, nodular, purpuric, pustular, scaling or urticarial.          Comments: Left hand vesicular overlying macular erythema dorsum adjacent thumb; bilateral anterior forearms and lower legs with linear abrasions, macular erythema and scattered papules dry mildly TTP  and nonpitting edema 0-1+/4 localized to rash  Neurological:     General: No focal deficit present.     Mental Status: She is alert and oriented to person, place, and time. Mental status is at baseline.     GCS: GCS eye subscore is 4. GCS verbal subscore is 5. GCS motor subscore is 6.     Cranial Nerves: Cranial nerves 2-12 are intact. No cranial nerve deficit, dysarthria or facial asymmetry.     Sensory: Sensation is intact.     Motor: Motor function is intact. No weakness, tremor, atrophy, abnormal muscle tone or seizure activity.     Coordination: Coordination is intact. Coordination normal.     Gait: Gait is intact. Gait normal.     Comments: In/out of chair without difficulty; gait sure and steady in clinic; bilateral hand grasp equal 5/5  Psychiatric:        Attention and Perception: Attention and perception normal.        Mood and Affect: Mood and affect normal.        Speech: Speech normal.        Behavior: Behavior normal.  Behavior is cooperative.        Thought Content: Thought content normal.        Cognition and Memory: Cognition and memory normal.        Judgment: Judgment normal.      Patient applied calagel immediately in clinic to forearms and hands and reported relief from itching.  Plans to take first dose of prednisone at her desk now.     Assessment & Plan:   A-irritant contact dermatitis due to plants except food  P-Continue xyzal '5mg'$  or may switch to zyrtec '10mg'$  po BID prn itching.  calagel thin smear BID prn itching given 4 UD from clinic stock; do not get in eyes; if worsening with calagel use stop and trial hydrocortisone 1% topical BID small smear affected area on face sparing use rest of body can apply more liberally.  Wash hands before and after application.  Avoid hot steam showers.  Apply emollient twice a day e.g. Fragrance free vaseline.  Given 4 UD aquaphor ointment from clinic stock.    May apply ice/cold compress 5 minutes QID prn itching/swelling.  Avoid eye makeup use until rash cleared.  Avoid rubbing face at work.  Shower when she finishes work/prior to bedtime.  Avoid harsh/abrasive soaps use fragrance free/sensitive like dove/cetaphil.    Medication as directed. Call or return to clinic as needed if these symptoms worsen or fail to improve as anticipated. [employer] handout on poison ivy/oakcontact dermatitis and pruritis  Follow up for re-evaluation in 1 week as typically needs 2-3 week taper of prednisone.  First week of taper dispensed today from [facility] to patient.  Oral steroids used due to large surface area affected.  Prednisone '10mg'$  taper with breakfast po '30mg'$  x 2 days, '20mg'$  x 7 days and '10mg'$  x 1 day #21 RF0.  Patient has tolerated prednisone oral in the past for bronchitis.  Aware of possible side effects elevated blood sugar/heart rate/BP/insomnia/increased/decreased appetite/Follow up sooner if no improvement and/or worsening of rash with plan of care. Patient verbalized agreement  and understanding of treatment plan and had no further questions at this time

## 2022-03-18 NOTE — Patient Instructions (Signed)
Poison Oak Dermatitis  Poison oak dermatitis is inflammation of the skin that is caused by contact with the chemicals in the leaves of the poison oak (Toxicodendron) plant. The skin reaction often includes redness, swelling, blisters, and extreme itching. What are the causes? This condition is caused by a specific chemical (urushiol) that is found in the sap of the poison oak plant. This chemical is sticky and can be easily spread to people, animals, and objects. You can get poison oak dermatitis by: Having direct contact with a poison oak plant. Touching animals, other people, or objects that have come in contact with poison oak and have the chemical on them. What increases the risk? This condition is more likely to develop in people who: Are outdoors often in wooded or [place] areas. Go outdoors without wearing protective clothing, such as closed shoes, long pants, and a long-sleeved shirt. What are the signs or symptoms? Symptoms of this condition include: Redness of the skin. Extreme itching. A rash that often includes bumps and blisters. The rash usually appears 48 hours after exposure if you have been exposed before. If this is the first time you have been exposed, the rash may not appear until a week after exposure. Swelling. This may occur if the reaction is more severe. Symptoms usually last for 1-2 weeks. However, the first time you develop this condition, symptoms may last 3-4 weeks. How is this diagnosed? This condition may be diagnosed based on your symptoms and a physical exam. Your health care provider may also ask you about any recent outdoor activity. How is this treated? Treatment for this condition will vary depending on how severe it is. Treatment may include: Hydrocortisone creams or calamine lotions to relieve itching. Oatmeal baths to soothe the skin. Over-the-counter antihistamine medicines to help reduce itching. Steroid medicine taken by mouth (orally) for more  severe reactions. Follow these instructions at home: Medicines Take or apply over-the-counter and prescription medicines only as told by your health care provider. Use hydrocortisone creams or calamine lotion as needed to soothe the skin and relieve itching. General instructions Do not scratch or rub your skin. Apply a cold, wet cloth (cold compress) to the affected areas or take baths in cool water. This will help with itching. Avoid hot baths and showers. Take oatmeal baths as needed. Use colloidal oatmeal. You can get this at your local pharmacy or grocery store. Follow the instructions on the packaging. While you have the rash, wash clothes right after you wear them. Keep all follow-up visits as told by your health care provider. This is important. How is this prevented?  Learn to identify the poison oak plant and avoid contact with the plant. This plant can be recognized by the number of leaves. Generally, poison oak has three leaves with flowering branches on a single stem. The leaves are often a bit fuzzy and have a toothlike edge. If you have been exposed to poison oak, thoroughly wash your skin with soap and water right away. You have about 30 minutes to remove the plant resin before it will cause the rash. Be sure to wash under your fingernails because any plant resin there will continue to spread the rash. When hiking or camping, wear clothes that will help you avoid exposure on the skin. This includes long pants, a long-sleeved shirt, tall socks, and hiking boots. You can also apply preventive lotion to your skin to help limit exposure. If you suspect that your clothes or outdoor gear came in contact  with poison oak, rinse them off outside with a garden hose before bringing them inside your house. When doing yard work or gardening, wear gloves, long sleeves, long pants, and boots. Wash your garden tools and gloves if they come in contact with poison oak. If you suspect that your pet has  come into contact with poison oak, wash him or her with pet shampoo and water. Make sure you wear gloves while washing your pet. Do not burn poison oak plants. This can release the chemical from the plant into the air and may cause a reaction on the skin or eyes, or in the lungs from breathing in the smoke. Contact a health care provider if you have: Open sores in the rash area. More redness, swelling, or pain in the affected area. Redness that spreads beyond the rash area. Fluid, blood, or pus coming from the affected area. A fever. A rash over a large area of your body. A rash on your eyes, mouth, or genitals. A rash that does not improve after a few weeks. Get help right away if: Your face swells or your eyes swell shut. You have trouble breathing. You have trouble swallowing. These symptoms may represent a serious problem that is an emergency. Do not wait to see if the symptoms will go away. Get medical help right away. Call your local emergency services (911 in the [place] Do not drive yourself to the hospital. Summary Poison oak dermatitis is inflammation of the skin that is caused by contact with the chemicals on the leaves of the poison oak (Toxicodendron) plant. Symptoms of this condition include redness, extreme itching, a rash, and swelling. Do not scratch or rub your skin. Take or apply over-the-counter and prescription medicines only as told by your health care provider. This information is not intended to replace advice given to you by your health care provider. Make sure you discuss any questions you have with your health care provider. Document Revised: 11/26/2018 Document Reviewed: 09/03/2018 Elsevier Patient Education  [place] Dermatitis Dermatitis is redness, soreness, and swelling (inflammation) of the skin. Contact dermatitis is a reaction to certain substances that touch the skin. Many different substances can cause contact dermatitis. There are two  types of contact dermatitis: Irritant contact dermatitis. This type is caused by something that irritates your skin, such as having dry hands from washing them too often with soap. This type does not require previous exposure to the substance for a reaction to occur. This is the most common type. Allergic contact dermatitis. This type is caused by a substance that you are allergic to, such as poison ivy. This type occurs when you have been exposed to the substance (allergen) and develop a sensitivity to it. Dermatitis may develop soon after your first exposure to the allergen, or it may not develop until the next time you are exposed and every time thereafter. What are the causes? Irritant contact dermatitis is most commonly caused by exposure to: Makeup. Soaps. Detergents. Bleaches. Acids. Metal salts, such as nickel. Allergic contact dermatitis is most commonly caused by exposure to: Poisonous plants. Chemicals. Jewelry. Latex. Medicines. Preservatives in products, such as clothing. What increases the risk? You are more likely to develop this condition if you have: A job that exposes you to irritants or allergens. Certain medical conditions, such as asthma or eczema. What are the signs or symptoms? Symptoms of this condition may occur on your body anywhere the irritant has touched you or is touched by you.  Symptoms include: Dryness or flaking. Redness. Cracks. Itching. Pain or a burning feeling. Blisters. Drainage of small amounts of blood or clear fluid from skin cracks. With allergic contact dermatitis, there may also be swelling in areas such as the eyelids, mouth, or genitals. How is this diagnosed? This condition is diagnosed with a medical history and physical exam. A patch skin test may be performed to help determine the cause. If the condition is related to your job, you may need to see an occupational medicine specialist. How is this treated? This condition is treated  by checking for the cause of the reaction and protecting your skin from further contact. Treatment may also include: Steroid creams or ointments. Oral steroid medicines may be needed in more severe cases. Antibiotic medicines or antibacterial ointments, if a skin infection is present. Antihistamine lotion or an antihistamine taken by mouth to ease itching. A bandage (dressing). Follow these instructions at home: Skin care Moisturize your skin as needed. Apply cool compresses to the affected areas. Try applying baking soda paste to your skin. Stir water into baking soda until it reaches a paste-like consistency. Do not scratch your skin, and avoid friction to the affected area. Avoid the use of soaps, perfumes, and dyes. Medicines Take or apply over-the-counter and prescription medicines only as told by your health care provider. If you were prescribed an antibiotic medicine, take or apply the antibiotic as told by your health care provider. Do not stop using the antibiotic even if your condition improves. Bathing Try taking a bath with: Epsom salts. Follow the instructions on the packaging. You can get these at your local pharmacy or grocery store. Baking soda. Pour a small amount into the bath as directed by your health care provider. Colloidal oatmeal. Follow the instructions on the packaging. You can get this at your local pharmacy or grocery store. Bathe less frequently, such as every other day. Bathe in lukewarm water. Avoid using hot water. Bandage care If you were given a bandage (dressing), change it as told by your health care provider. Wash your hands with soap and water before and after you change your dressing. If soap and water are not available, use hand sanitizer. General instructions Avoid the substance that caused your reaction. If you do not know what caused it, keep a journal to try to track what caused it. Write down: What you eat. What cosmetic products you use. What  you drink. What you wear in the affected area. This includes jewelry. Check the affected areas every day for signs of infection. Check for: More redness, swelling, or pain. More fluid or blood. Warmth. Pus or a bad smell. Keep all follow-up visits as told by your health care provider. This is important. Contact a health care provider if: Your condition does not improve with treatment. Your condition gets worse. You have signs of infection such as swelling, tenderness, redness, soreness, or warmth in the affected area. You have a fever. You have new symptoms. Get help right away if: You have a severe headache, neck pain, or neck stiffness. You vomit. You feel very sleepy. You notice red streaks coming from the affected area. Your bone or joint underneath the affected area becomes painful after the skin has healed. The affected area turns darker. You have difficulty breathing. Summary Dermatitis is redness, soreness, and swelling (inflammation) of the skin. Contact dermatitis is a reaction to certain substances that touch the skin. Symptoms of this condition may occur on your body anywhere the irritant  has touched you or is touched by you. This condition is treated by figuring out what caused the reaction and protecting your skin from further contact. Treatment may also include medicines and skin care. Avoid the substance that caused your reaction. If you do not know what caused it, keep a journal to try to track what caused it. Contact a health care provider if your condition gets worse or you have signs of infection such as swelling, tenderness, redness, soreness, or warmth in the affected area. This information is not intended to replace advice given to you by your health care provider. Make sure you discuss any questions you have with your health care provider. Document Revised: 05/20/2021 Document Reviewed: 05/20/2021 Elsevier Patient Education  [place]  Dermatitis Poison ivy dermatitis is inflammation of the skin that is caused by chemicals in the leaves of the poison ivy plant. The skin reaction often involves redness, swelling, blisters, and extreme itching. What are the causes? This condition is caused by a chemical (urushiol) found in the sap of the poison ivy plant. This chemical is sticky and can be easily spread to people, animals, and objects. You can get poison ivy dermatitis by: Having direct contact with a poison ivy plant. Touching animals, other people, or objects that have come in contact with poison ivy and have the chemical on them. What increases the risk? This condition is more likely to develop in people who: Are outdoors often in wooded or [place] areas. Go outdoors without wearing protective clothing, such as closed shoes, long pants, and a long-sleeved shirt. What are the signs or symptoms? Symptoms of this condition include: Redness of the skin. Extreme itching. A rash that often includes bumps and blisters. The rash usually appears 48 hours after exposure, if you have been exposed before. If this is the first time you have been exposed, the rash may not appear until a week after exposure. Swelling. This may occur if the reaction is more severe. Symptoms usually last for 1-2 weeks. However, the first time you develop this condition, symptoms may last 3-4 weeks. How is this diagnosed? This condition may be diagnosed based on your symptoms and a physical exam. Your health care provider may also ask you about any recent outdoor activity. How is this treated? Treatment for this condition will vary depending on how severe it is. Treatment may include: Hydrocortisone cream or calamine lotion to relieve itching. Oatmeal baths to soothe the skin. Medicines, such as over-the-counter antihistamine tablets. Oral steroid medicine, for more severe reactions. Follow these instructions at home: Medicines Take or apply  over-the-counter and prescription medicines only as told by your health care provider. Use hydrocortisone cream or calamine lotion as needed to soothe the skin and relieve itching. General instructions Do not scratch or rub your skin. Apply a cold, wet cloth (cold compress) to the affected areas or take baths in cool water. This will help with itching. Avoid hot baths and showers. Take oatmeal baths as needed. Use colloidal oatmeal. You can get this at your local pharmacy or grocery store. Follow the instructions on the packaging. While you have the rash, wash clothes right after you wear them. Keep all follow-up visits as told by your health care provider. This is important. How is this prevented?  Learn to identify the poison ivy plant and avoid contact with the plant. This plant can be recognized by the number of leaves. Generally, poison ivy has three leaves with flowering branches on a  single stem. The leaves are typically glossy, and they have jagged edges that come to a point at the front. If you have been exposed to poison ivy, thoroughly wash with soap and water right away. You have about 30 minutes to remove the plant resin before it will cause the rash. Be sure to wash under your fingernails, because any plant resin there will continue to spread the rash. When hiking or camping, wear clothes that will help you to avoid exposure on the skin. This includes long pants, a long-sleeved shirt, tall socks, and hiking boots. You can also apply preventive lotion to your skin to help limit exposure. If you suspect that your clothes or outdoor gear came in contact with poison ivy, rinse them off outside with a garden hose before you bring them inside your house. When doing yard work or gardening, wear gloves, long sleeves, long pants, and boots. Wash your garden tools and gloves if they come in contact with poison ivy. If you suspect that your pet has come into contact with poison ivy, wash him or her  with pet shampoo and water. Make sure to wear gloves while washing your pet. Contact a health care provider if you have: Open sores in the rash area. More redness, swelling, or pain in the affected area. Redness that spreads beyond the rash area. Fluid, blood, or pus coming from the affected area. A fever. A rash over a large area of your body. A rash on your eyes, mouth, or genitals. A rash that does not improve after a few weeks. Get help right away if: Your face swells or your eyes swell shut. You have trouble breathing. You have trouble swallowing. These symptoms may represent a serious problem that is an emergency. Do not wait to see if the symptoms will go away. Get medical help right away. Call your local emergency services (911 in the [place] Do not drive yourself to the hospital. Summary Poison ivy dermatitis is inflammation of the skin that is caused by chemicals in the leaves of the poison ivy plant. Symptoms of this condition include redness, itching, a rash, and swelling. Do not scratch or rub your skin. Take or apply over-the-counter and prescription medicines only as told by your health care provider. This information is not intended to replace advice given to you by your health care provider. Make sure you discuss any questions you have with your health care provider. Document Revised: 05/20/2021 Document Reviewed: 05/20/2021 Elsevier Patient Education  [employer].

## 2022-06-10 ENCOUNTER — Encounter: Payer: Self-pay | Admitting: Registered Nurse

## 2022-06-10 ENCOUNTER — Ambulatory Visit: Payer: Self-pay | Admitting: Registered Nurse

## 2022-06-10 VITALS — BP 135/90 | HR 85 | Temp 98.1°F | Resp 18

## 2022-06-10 DIAGNOSIS — B029 Zoster without complications: Secondary | ICD-10-CM

## 2022-06-10 DIAGNOSIS — J0101 Acute recurrent maxillary sinusitis: Secondary | ICD-10-CM

## 2022-06-10 DIAGNOSIS — J209 Acute bronchitis, unspecified: Secondary | ICD-10-CM

## 2022-06-10 MED ORDER — VALACYCLOVIR HCL 1 G PO TABS: 1000.0000 mg | ORAL_TABLET | Freq: Three times a day (TID) | ORAL | 1 refills | Status: AC

## 2022-06-10 MED ORDER — AMOXICILLIN-POT CLAVULANATE 875-125 MG PO TABS: 1.0000 | ORAL_TABLET | Freq: Two times a day (BID) | ORAL | 0 refills | Status: AC

## 2022-06-10 MED ORDER — PREDNISONE 10 MG PO TABS: ORAL_TABLET | ORAL | 0 refills | Status: AC

## 2022-06-10 NOTE — Progress Notes (Unsigned)
Subjective:     Patient ID: [name], female   DOB: [birth year], 61 y.o.   MRN: [id number]  61y/o caucasian female established patient here for ear pain and sinus pain evaluation.  Was in ER with parents [place] Sidle exposed to many ill persons.  Son wedding this past weekend/stressors at home and work.  Rash right back she would also liked checked as unsure if shingles or something else.  Itching/burning denied fever/chills/discharge/trauma.  Hasn't performed home covid test when sinus symptoms/cough started.     Review of Systems  Constitutional:  Negative for chills, diaphoresis, fatigue and fever.  HENT:  Positive for congestion, ear pain, postnasal drip, sinus pressure and sinus pain. Negative for ear discharge, facial swelling, mouth sores, trouble swallowing and voice change.   Eyes:  Negative for photophobia and visual disturbance.  Respiratory:  Positive for cough and chest tightness. Negative for stridor.   Cardiovascular:  Negative for palpitations.  Gastrointestinal:  Negative for diarrhea, nausea and vomiting.  Endocrine: Negative for cold intolerance and heat intolerance.  Genitourinary:  Negative for difficulty urinating.  Musculoskeletal:  Negative for gait problem, joint swelling, neck pain and neck stiffness.  Skin:  Positive for color change and rash. Negative for pallor and wound.  Allergic/Immunologic: Positive for environmental allergies. Negative for food allergies.  Neurological:  Positive for headaches. Negative for dizziness, tremors, seizures, syncope, facial asymmetry, speech difficulty, weakness, light-headedness and numbness.  Hematological:  Negative for adenopathy. Does not bruise/bleed easily.  Psychiatric/Behavioral:  Negative for agitation, confusion and sleep disturbance.    Home covid test performed prior to returning to workcenter negative confirmed.    Objective:   Physical Exam Vitals and nursing note reviewed.  Constitutional:      General:  She is awake. She is not in acute distress.    Appearance: Normal appearance. She is well-developed and well-groomed. She is obese. She is not ill-appearing, toxic-appearing or diaphoretic.  HENT:     Head: Normocephalic and atraumatic.     Jaw: There is normal jaw occlusion. No trismus.     Salivary Glands: Right salivary gland is not diffusely enlarged or tender. Left salivary gland is not diffusely enlarged or tender.     Right Ear: Hearing, ear canal and external ear normal. No decreased hearing noted. No laceration, drainage, swelling or tenderness. A middle ear effusion is present. There is no impacted cerumen. No foreign body. No mastoid tenderness. No PE tube. No hemotympanum. Tympanic membrane is bulging. Tympanic membrane is not injected, scarred, perforated, erythematous or retracted.     Left Ear: Hearing, ear canal and external ear normal. No decreased hearing noted. Swelling and tenderness present. No laceration or drainage. A middle ear effusion is present. There is no impacted cerumen. No foreign body. No mastoid tenderness. No PE tube. No hemotympanum. Tympanic membrane is bulging. Tympanic membrane is not injected, scarred, perforated, erythematous or retracted.     Ears:     Comments: Air fluid level clear bilateral TMs intact without erythema/injection; left auditory canal with mild swelling/no debris and tenderness to otoscopic exam    Nose: Mucosal edema, congestion and rhinorrhea present. No nasal deformity, septal deviation or laceration. Rhinorrhea is clear.     Right Turbinates: Enlarged and swollen. Not pale.     Left Turbinates: Enlarged and swollen. Not pale.     Right Sinus: Maxillary sinus tenderness and frontal sinus tenderness present.     Left Sinus: Maxillary sinus tenderness and frontal sinus tenderness present.  Comments: Bilateral nasal turbinates edema/erythema clear discharge; bilateral maxillary and frontal sinuses TTP    Mouth/Throat:     Lips: Pink. No  lesions.     Mouth: Mucous membranes are not pale, dry and not cyanotic. No lacerations, oral lesions or angioedema.     Dentition: No dental abscesses or gum lesions.     Tongue: No lesions. Tongue does not deviate from midline.     Palate: No mass and lesions.     Pharynx: Uvula midline. Pharyngeal swelling and posterior oropharyngeal erythema present. No oropharyngeal exudate or uvula swelling.     Tonsils: No tonsillar exudate or tonsillar abscesses. 0 on the right. 0 on the left.     Comments: Dry tongue; cobblestoning posterior pharynx Eyes:     General: Lids are normal. Vision grossly intact. Gaze aligned appropriately. Allergic shiner present. No scleral icterus.       Right eye: No foreign body, discharge or hordeolum.        Left eye: No foreign body, discharge or hordeolum.     Extraocular Movements: Extraocular movements intact.     Right eye: Normal extraocular motion and no nystagmus.     Left eye: Normal extraocular motion and no nystagmus.     Conjunctiva/sclera: Conjunctivae normal.     Right eye: Right conjunctiva is not injected. No chemosis, exudate or hemorrhage.    Left eye: Left conjunctiva is not injected. No chemosis, exudate or hemorrhage.    Pupils: Pupils are equal, round, and reactive to light. Pupils are equal.     Right eye: Pupil is round and reactive.     Left eye: Pupil is round and reactive.  Neck:     Thyroid: No thyroid mass or thyromegaly.     Trachea: Trachea and phonation normal. No tracheal tenderness or tracheal deviation.  Cardiovascular:     Rate and Rhythm: Normal rate and regular rhythm.     Pulses: Normal pulses.          Radial pulses are 2+ on the right side and 2+ on the left side.     Heart sounds: Normal heart sounds, S1 normal and S2 normal.  Pulmonary:     Effort: Pulmonary effort is normal. No accessory muscle usage or respiratory distress.     Breath sounds: Normal breath sounds and air entry. No stridor, decreased air movement or  transmitted upper airway sounds. No decreased breath sounds, wheezing, rhonchi or rales.     Comments: Spoke full sentences without difficulty; intermittent dry cough observed in clinic;  Chest:     Chest wall: No tenderness.  Abdominal:     General: There is no distension.     Palpations: Abdomen is soft.  Musculoskeletal:        General: Normal range of motion.     Right hand: Normal strength. Normal capillary refill.     Left hand: Normal strength. Normal capillary refill.     Cervical back: Normal range of motion and neck supple. No swelling, edema, deformity, erythema, signs of trauma, lacerations, rigidity, tenderness or crepitus. No pain with movement or muscular tenderness. Normal range of motion.     Thoracic back: Tenderness present. No swelling, edema, deformity, signs of trauma, lacerations or spasms. Normal range of motion.     Lumbar back: No swelling, edema, deformity, signs of trauma, lacerations or tenderness.       Back:     Right hip: Normal.     Left hip: Normal.  Right knee: Normal.     Left knee: Normal.     Right lower leg: No edema.     Left lower leg: No edema.  Lymphadenopathy:     Head:     Right side of head: No submental, submandibular, tonsillar, preauricular, posterior auricular or occipital adenopathy.     Left side of head: No submental, submandibular, tonsillar, preauricular, posterior auricular or occipital adenopathy.     Cervical: No cervical adenopathy.     Right cervical: No superficial, deep or posterior cervical adenopathy.    Left cervical: No superficial, deep or posterior cervical adenopathy.  Skin:    General: Skin is warm and dry.     Capillary Refill: Capillary refill takes less than 2 seconds.     Coloration: Skin is not ashen, cyanotic, jaundiced, mottled, pale or sallow.     Findings: Abrasion, erythema and rash present. No abscess, acne, bruising, burn, ecchymosis, signs of injury, laceration, lesion, petechiae or wound. Rash is  macular and papular. Rash is not crusting, nodular, purpuric, pustular, scaling, urticarial or vesicular.     Nails: There is no clubbing.          Comments: Papule tips abraded centrally right  flank dry no bleeding/ecchymosis/discharge; 4x7cm grouped macular papular erythematous rash right flank  Neurological:     Mental Status: She is alert and oriented to person, place, and time. She is not disoriented.     GCS: GCS eye subscore is 4. GCS verbal subscore is 5. GCS motor subscore is 6.     Cranial Nerves: Cranial nerves 2-12 are intact. No cranial nerve deficit, dysarthria or facial asymmetry.     Sensory: Sensation is intact. No sensory deficit.     Motor: Motor function is intact. No weakness, tremor, atrophy, abnormal muscle tone or seizure activity.     Coordination: Coordination is intact. Coordination normal.     Gait: Gait is intact. Gait normal.  Psychiatric:        Attention and Perception: Attention and perception normal.        Mood and Affect: Mood and affect normal.        Speech: Speech normal.        Behavior: Behavior normal. Behavior is cooperative.        Thought Content: Thought content normal.        Cognition and Memory: Cognition and memory normal.        Judgment: Judgment normal.        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  start augmentin '875mg'$  po BID x 10 days #20 RF0 and prednisone '10mg'$  taper '30mg'$  x 3 days; '20mg'$  x 7 days then '10mg'$  x 1 day po with breakfast  #21 RF0 dispensed from [facility] to patient  Denied personal or family history of ENT cancer.  Shower BID especially prior to bed. No evidence of systemic bacterial infection, non toxic and well hydrated.  I do not see where any further testing or imaging is necessary at this time.   I will suggest supportive care, rest,  good hygiene and encourage the patient to take adequate fluids.  The patient is to return to clinic or EMERGENCY ROOM if symptoms worsen or change significantly.  Exitcare handout on sinusitis.  Patient verbalized agreement and understanding of treatment plan  and had no further questions at this time.   P2:  Hand washing and cover cough     Cough lozenges po q2h prn cough.  Prednisone taper '10mg'$  '30mg'$  x 2 days/'20mg'$  x 7 days/'10mg'$ x 1 day) po daily with breakfast #21 RF0 dispensed from [facility].  Discussed possible side effects increased/decreased appetite, difficulty sleeping, increased blood sugar, increased blood pressure and heart rate.  Albuterol MDI 69mg 1-2 puffs po q4-6h prn protracted cough/wheezeside effect increased heart rate. Bronchitis simple, community acquired, may have started as viral (probably respiratory syncytial, parainfluenza, influenza, or adenovirus), but now evidence of acute purulent bronchitis with resultant bronchial edema and mucus formation.  Viruses are the most common cause of bronchial inflammation in otherwise healthy adults with acute bronchitis.  The appearance of sputum is not predictive of whether a bacterial infection is present.  Purulent sputum is most often caused by viral infections.  There are a small portion of those caused by non-viral agents being Mycoplama pneumonia.  Microscopic examination or C&S of sputum in the healthy adult with acute bronchitis is generally not helpful (usually negative or normal respiratory flora) other considerations being cough from upper respiratory tract infections, sinusitis or allergic syndromes (mild asthma or viral pneumonia).  Differential Diagnoses:  reactive airway disease (asthma, allergic aspergillosis (eosinophilia), chronic bronchitis, respiratory infection (sinusitis, common cold, pneumonia), congestive heart failure, reflux esophagitis, bronchogenic tumor, aspiration syndromes and/or exposure to pulmonary irritants/smoke.  Without  high fever, severe dyspnea, lack of physical findings or other risk factors, I will hold on a chest radiograph and CBC at this time.  I discussed that approximately 50% of patients with acute bronchitis have a cough that lasts up to three weeks, and 25% for over a month.  Tylenol '500mg'$  one to two tablets every four to six hours as needed for fever or myalgias.  No aspirin. Exitcare handout on bronchitis.  ER if hemopthysis, SOB, worst chest pain of life.   Patient instructed to follow up prn if symptoms worsen or do not resolve as anticipated.  Patient verbalized agreement and understanding of treatment plan and had no further questions at this time.  P2:  hand washing and cover cough   Valtrex '1000mg'$  po TID x 7 days #21 RF1 sent to patient pharmacy of choice.  Discussed viral illness tends to flare up when immune system stressed.  Avoid itching. May apply calagel 2% gel BID or alternate with hydrocortisone 1% topical BID wash hands before and after application.  OTC antihistamine of patient choice po daily to help with itching.  calagel thin smear BID prn itching given 4 UD from clinic stock; do not get in eyes; if worsening with calagel use stop and trial hydrocortisone 1% topical BID small smear affected area given 4 UD along with aquaphor ointment apply daily irritated skin 4 UD from clinic stock.  Avoid hot steam showers.  Apply emollient twice a day e.g. Fragrance free vaseline, aquaphor given 4 UD from clinic stock.  May apply ice/cold compress 5 minutes QID prn itching/swelling.  Shower when patient finishes work/prior to bedtime.  Avoid harsh/abrasive soaps use fragrance free/sensitive like dove/cetaphil.    Medication as directed. Call or return to clinic as needed if these symptoms worsen or fail to improve as anticipated. Exitcare handout on shingles  Follow up for re-evaluation in 48 hours if no improvement and/or worsening of rash with plan of care e.g. spreading to face/fever/chills/purulent  discharge.  Discussed augmentin for sinusitis also covers for cellulitis/skin infection.  Patient  has had shingles vaccine.  If rash symptoms reoccur start valtrex as soon as patient notices symptoms as I gave one refill on Rx.  Avoid scratching area and then especially scratching other body areas/eye as could spread to eye. Patient verbalized agreement and understanding of treatment plan and had no further questions at this time

## 2022-06-10 NOTE — Patient Instructions (Signed)
Acute Bronchitis, Adult  Acute bronchitis is sudden inflammation of the main airways (bronchi) that come off the windpipe (trachea) in the lungs. The swelling causes the airways to get smaller and make more mucus than normal. This can make it hard to breathe and can cause coughing or noisy breathing (wheezing). Acute bronchitis may last several weeks. The cough may last longer. Allergies, asthma, and exposure to smoke may make the condition worse. What are the causes? This condition can be caused by germs and by substances that irritate the lungs, including: Cold and flu viruses. The most common cause of this condition is the virus that causes the common cold. Bacteria. This is less common. Breathing in substances that irritate the lungs, including: Smoke from cigarettes and other forms of tobacco. Dust and pollen. Fumes from household cleaning products, gases, or burned fuel. Indoor or outdoor air pollution. What increases the risk? The following factors may make you more likely to develop this condition: A weak body's defense system, also called the immune system. A condition that affects your lungs and breathing, such as asthma. What are the signs or symptoms? Common symptoms of this condition include: Coughing. This may bring up clear, yellow, or green mucus from your lungs (sputum). Wheezing. Runny or stuffy nose. Having too much mucus in your lungs (chest congestion). Shortness of breath. Aches and pains, including sore throat or chest. How is this diagnosed? This condition is usually diagnosed based on: Your symptoms and medical history. A physical exam. You may also have other tests, including tests to rule out other conditions, such as pneumonia. These tests include: A test of lung function. Test of a mucus sample to look for the presence of bacteria. Tests to check the oxygen level in your blood. Blood tests. Chest X-ray. How is this treated? Most cases of acute  bronchitis clear up over time without treatment. Your health care provider may recommend: Drinking more fluids to help thin your mucus so it is easier to cough up. Taking inhaled medicine (inhaler) to improve air flow in and out of your lungs. Using a vaporizer or a humidifier. These are machines that add water to the air to help you breathe better. Taking a medicine that thins mucus and clears congestion (expectorant). Taking a medicine that prevents or stops coughing (cough suppressant). It is not common to take an antibiotic medicine for this condition. Follow these instructions at home:  Take over-the-counter and prescription medicines only as told by your health care provider. Use an inhaler, vaporizer, or humidifier as told by your health care provider. Take two teaspoons (10 mL) of honey at bedtime to lessen coughing at night. Drink enough fluid to keep your urine pale yellow. Do not use any products that contain nicotine or tobacco. These products include cigarettes, chewing tobacco, and vaping devices, such as e-cigarettes. If you need help quitting, ask your health care provider. Get plenty of rest. Return to your normal activities as told by your health care provider. Ask your health care provider what activities are safe for you. Keep all follow-up visits. This is important. How is this prevented? To lower your risk of getting this condition again: Wash your hands often with soap and water for at least 20 seconds. If soap and water are not available, use hand sanitizer. Avoid contact with people who have cold symptoms. Try not to touch your mouth, nose, or eyes with your hands. Avoid breathing in smoke or chemical fumes. Breathing smoke or chemical fumes will make   your condition worse. Get the flu shot every year. Contact a health care provider if: Your symptoms do not improve after 2 weeks. You have trouble coughing up the mucus. Your cough keeps you awake at night. You have  a fever. Get help right away if you: Cough up blood. Feel pain in your chest. Have severe shortness of breath. Faint or keep feeling like you are going to faint. Have a severe headache. Have a fever or chills that get worse. These symptoms may represent a serious problem that is an emergency. Do not wait to see if the symptoms will go away. Get medical help right away. Call your local emergency services (911 in the [place] Do not drive yourself to the hospital. Summary Acute bronchitis is inflammation of the main airways (bronchi) that come off the windpipe (trachea) in the lungs. The swelling causes the airways to get smaller and make more mucus than normal. Drinking more fluids can help thin your mucus so it is easier to cough up. Take over-the-counter and prescription medicines only as told by your health care provider. Do not use any products that contain nicotine or tobacco. These products include cigarettes, chewing tobacco, and vaping devices, such as e-cigarettes. If you need help quitting, ask your health care provider. Contact a health care provider if your symptoms do not improve after 2 weeks. This information is not intended to replace advice given to you by your health care provider. Make sure you discuss any questions you have with your health care provider. Document Revised: 11/14/2021 Document Reviewed: 12/05/2020 Elsevier Patient Education  2023 [employer] Sinus Infection, Adult A sinus infection, also called sinusitis, is inflammation of your sinuses. Sinuses are hollow spaces in the bones around your face. Your sinuses are located: Around your eyes. In the middle of your forehead. Behind your nose. In your cheekbones. Mucus normally drains out of your sinuses. When your nasal tissues become inflamed or swollen, mucus can become trapped or blocked. This allows bacteria, viruses, and fungi to grow, which leads to infection. Most infections of the sinuses are caused by a  virus. A sinus infection can develop quickly. It can last for up to 4 weeks (acute) or for more than 12 weeks (chronic). A sinus infection often develops after a cold. What are the causes? This condition is caused by anything that creates swelling in the sinuses or stops mucus from draining. This includes: Allergies. Asthma. Infection from bacteria or viruses. Deformities or blockages in your nose or sinuses. Abnormal growths in the nose (nasal polyps). Pollutants, such as chemicals or irritants in the air. Infection from fungi. This is rare. What increases the risk? You are more likely to develop this condition if you: Have a weak body defense system (immune system). Do a lot of swimming or diving. Overuse nasal sprays. Smoke. What are the signs or symptoms? The main symptoms of this condition are pain and a feeling of pressure around the affected sinuses. Other symptoms include: Stuffy nose or congestion that makes it difficult to breathe through your nose. Thick yellow or greenish drainage from your nose. Tenderness, swelling, and warmth over the affected sinuses. A cough that may get worse at night. Decreased sense of smell and taste. Extra mucus that collects in the throat or the back of the nose (postnasal drip) causing a sore throat or bad breath. Tiredness (fatigue). Fever. How is this diagnosed? This condition is diagnosed based on: Your symptoms. Your medical history. A physical exam. Tests to find   out if your condition is acute or chronic. This may include: Checking your nose for nasal polyps. Viewing your sinuses using a device that has a light (endoscope). Testing for allergies or bacteria. Imaging tests, such as an MRI or CT scan. In rare cases, a bone biopsy may be done to rule out more serious types of fungal sinus disease. How is this treated? Treatment for a sinus infection depends on the cause and whether your condition is chronic or acute. If caused by a  virus, your symptoms should go away on their own within 10 days. You may be given medicines to relieve symptoms. They include: Medicines that shrink swollen nasal passages (decongestants). A spray that eases inflammation of the nostrils (topical intranasal corticosteroids). Rinses that help get rid of thick mucus in your nose (nasal saline washes). Medicines that treat allergies (antihistamines). Over-the-counter pain relievers. If caused by bacteria, your health care provider may recommend waiting to see if your symptoms improve. Most bacterial infections will get better without antibiotic medicine. You may be given antibiotics if you have: A severe infection. A weak immune system. If caused by narrow nasal passages or nasal polyps, surgery may be needed. Follow these instructions at home: Medicines Take, use, or apply over-the-counter and prescription medicines only as told by your health care provider. These may include nasal sprays. If you were prescribed an antibiotic medicine, take it as told by your health care provider. Do not stop taking the antibiotic even if you start to feel better. Hydrate and humidify  Drink enough fluid to keep your urine pale yellow. Staying hydrated will help to thin your mucus. Use a cool mist humidifier to keep the humidity level in your home above 50%. Inhale steam for 10-15 minutes, 3-4 times a day, or as told by your health care provider. You can do this in the bathroom while a hot shower is running. Limit your exposure to cool or dry air. Rest Rest as much as possible. Sleep with your head raised (elevated). Make sure you get enough sleep each night. General instructions  Apply a warm, moist washcloth to your face 3-4 times a day or as told by your health care provider. This will help with discomfort. Use nasal saline washes as often as told by your health care provider. Wash your hands often with soap and water to reduce your exposure to germs. If  soap and water are not available, use hand sanitizer. Do not smoke. Avoid being around people who are smoking (secondhand smoke). Keep all follow-up visits. This is important. Contact a health care provider if: You have a fever. Your symptoms get worse. Your symptoms do not improve within 10 days. Get help right away if: You have a severe headache. You have persistent vomiting. You have severe pain or swelling around your face or eyes. You have vision problems. You develop confusion. Your neck is stiff. You have trouble breathing. These symptoms may be an emergency. Get help right away. Call 911. Do not wait to see if the symptoms will go away. Do not drive yourself to the hospital. Summary A sinus infection is soreness and inflammation of your sinuses. Sinuses are hollow spaces in the bones around your face. This condition is caused by nasal tissues that become inflamed or swollen. The swelling traps or blocks the flow of mucus. This allows bacteria, viruses, and fungi to grow, which leads to infection. If you were prescribed an antibiotic medicine, take it as told by your health care provider.   Do not stop taking the antibiotic even if you start to feel better. Keep all follow-up visits. This is important. This information is not intended to replace advice given to you by your health care provider. Make sure you discuss any questions you have with your health care provider. Document Revised: 07/09/2021 Document Reviewed: 07/09/2021 Elsevier Patient Education  [place], which is also known as herpes zoster, is an infection that causes a painful skin rash and fluid-filled blisters. It is caused by a virus. Shingles only develops in people who: Have had chickenpox. Have been vaccinated against chickenpox. Shingles is rare in this group. What are the causes? Shingles is caused by varicella-zoster virus. This is the same virus that causes chickenpox. After a  person is exposed to the virus, it stays in the body in an inactive (dormant) state. Shingles develops if the virus is reactivated. This can happen many years after the first (initial) exposure to the virus. It is not known what causes this virus to be reactivated. What increases the risk? People who have had chickenpox or received the chickenpox vaccine are at risk for shingles. Shingles infection is more common in people who: Are older than 59 years of age. Have a weakened disease-fighting system (immune system), such as people with: HIV (human immunodeficiency virus). AIDS (acquired immunodeficiency syndrome). Cancer. Are taking medicines that weaken the immune system, such as organ transplant medicines. Are experiencing a lot of stress. What are the signs or symptoms? Early symptoms of this condition include itching, tingling, and pain in an area on your skin. Pain may be described as burning, stabbing, or throbbing. A few days or weeks after early symptoms start, a painful red rash appears. The rash is usually on one side of the body and has a band-like or belt-like pattern. The rash eventually turns into fluid-filled blisters that break open, change into scabs, and dry up in about 2-3 weeks. At any time during the infection, you may also develop: A fever. Chills. A headache. Nausea. How is this diagnosed? This condition is diagnosed with a skin exam. Skin or fluid samples (a culture) may be taken from the blisters before a diagnosis is made. How is this treated? The rash may last for several weeks. There is not a specific cure for this condition. Your health care provider may prescribe medicines to help you manage pain, recover more quickly, and avoid long-term problems. Medicines may include: Antiviral medicines. Anti-inflammatory medicines. Pain medicines. Anti-itching medicines (antihistamines). If the area involved is on your face, you may be referred to a specialist, such as an  eye doctor (ophthalmologist) or an ear, nose, and throat (ENT) doctor (otorhinolaryngologist) to help you avoid eye problems, chronic pain, or disability. Follow these instructions at home: Medicines Take over-the-counter and prescription medicines only as told by your health care provider. Apply an anti-itch cream or numbing cream to the affected area as told by your health care provider. Relieving itching and discomfort  Apply cold, wet cloths (cold compresses) to the area of the rash or blisters as told by your health care provider. Cool baths can be soothing. Try adding baking soda or dry oatmeal to the water to reduce itching. Do not bathe in hot water. Use calamine lotion as recommended by your health care provider. This is an over-the-counter lotion that helps to relieve itchiness. Blister and rash care Keep your rash covered with a loose bandage (dressing). Wear loose-fitting clothing to help ease the pain of material  rubbing against the rash. Wash your hands with soap and water for at least 20 seconds before and after you change your dressing. If soap and water are not available, use hand sanitizer. Change your dressing as told by your health care provider. Keep your rash and blisters clean by washing the area with mild soap and cool water as told by your health care provider. Check your rash every day for signs of infection. Check for: More redness, swelling, or pain. Fluid or blood. Warmth. Pus or a bad smell. Do not scratch your rash or pick at your blisters. To help avoid scratching: Keep your fingernails clean and cut short. Wear gloves or mittens while you sleep, if scratching is a problem. General instructions Rest as told by your health care provider. Wash your hands often with soap and water for at least 20 seconds. If soap and water are not available, use hand sanitizer. Doing this lowers your chance of getting a bacterial skin infection. Before your blisters change into  scabs, your shingles infection can cause chickenpox in people who have never had it or have never been vaccinated against it. To prevent this from happening, avoid contact with other people, especially: Babies. Pregnant women. Children who have eczema. Older people who have transplants. People who have chronic illnesses, such as cancer or AIDS. Keep all follow-up visits. This is important. How is this prevented? Getting vaccinated is the best way to prevent shingles and protect against shingles complications. If you have not been vaccinated, talk with your health care provider about getting the vaccine. Where to find more information Centers for Disease Control and Prevention: http://www.wolf.info/ Contact a health care provider if: Your pain is not relieved with prescribed medicines. Your pain does not get better after the rash heals. You have any of these signs of infection: More redness, swelling, or pain around the rash. Fluid or blood coming from the rash. Warmth coming from your rash. Pus or a bad smell coming from the rash. A fever. Get help right away if: The rash is on your face or nose. You have facial pain, pain around your eye area, or loss of feeling on one side of your face. You have difficulty seeing. You have ear pain or have ringing in your ear. You have a loss of taste. Your condition gets worse. Summary Shingles, also known as herpes zoster, is an infection that causes a painful skin rash and fluid-filled blisters. This condition is diagnosed with a skin exam. Skin or fluid samples (a culture) may be taken from the blisters. Keep your rash covered with a loose bandage (dressing). Wear loose-fitting clothing to help ease the pain of material rubbing against the rash. Before your blisters change into scabs, your shingles infection can cause chickenpox in people who have never had it or have never been vaccinated against it. This information is not intended to replace advice  given to you by your health care provider. Make sure you discuss any questions you have with your health care provider. Document Revised: 07/30/2020 Document Reviewed: 07/30/2020 Elsevier Patient Education  [place].

## 2022-06-19 ENCOUNTER — Telehealth: Payer: Self-pay | Admitting: Registered Nurse

## 2022-06-19 ENCOUNTER — Encounter: Payer: Self-pay | Admitting: Registered Nurse

## 2022-06-19 DIAGNOSIS — B3731 Acute candidiasis of vulva and vagina: Secondary | ICD-10-CM

## 2022-06-19 MED ORDER — FLUCONAZOLE 150 MG PO TABS: ORAL_TABLET | ORAL | 0 refills | Status: DC

## 2022-06-19 NOTE — Telephone Encounter (Signed)
Patient contacted NP after taking my shingles medicine and sinus infection antibiotic having burning and itching really bad vaginal.  Haven't had a yeast infection in years.  Can you please send in a rx?  Rx sent electronically to her preferred pharmacy [facility] [address] diflucan '150mg'$  x 1 now po and may repeat in 72 hours if no relief #2 RF0.  [facility] handout on vulovaginal yeast sent to her my chart.  If no relief follow up with GYN provider as no yeast testing/pelvic exams available at [facility] per contract limitations. Patient verbalized understanding information/instructions, agreed with plan of care and had no further questions at this time.

## 2022-06-24 ENCOUNTER — Encounter: Payer: Self-pay | Admitting: Family Medicine

## 2022-06-24 NOTE — Telephone Encounter (Signed)
Patient contacted NP " I took the 2nd  pill for this yeast infection Sunday night - yesterday I was itching and burning so bad and got a headache.   It is a little better today but it is not going away.   What do I need to do now for this to go away?   I have been using that Vagisil cream too.   My daughter told me to try "AZO maximum strength for Urinary Pain Relief"    but I did not want to until I asked you about it."  Discussed with patient I recommended scheduling appt with her GYN/PCM for pelvic exam/vaginal swab as some yeast strains resistant to diflucan.  Azo OTC may be used if burning with urination/bladder spasms.  Patient stated she would schedule appt with her provider for evaluation since pelvic/vaginal testing not available at [facility].  Patient verbalized understanding information/instructions, agreed with plan of care and had no further questions at this time.

## 2022-06-27 ENCOUNTER — Telehealth: Payer: Self-pay | Admitting: Registered Nurse

## 2022-06-27 ENCOUNTER — Encounter: Payer: Self-pay | Admitting: Registered Nurse

## 2022-06-27 DIAGNOSIS — B3731 Acute candidiasis of vulva and vagina: Secondary | ICD-10-CM

## 2022-06-27 MED ORDER — FLUCONAZOLE 150 MG PO TABS
ORAL_TABLET | ORAL | 0 refills | Status: DC
Start: 2022-06-27 — End: 2022-07-14

## 2022-06-27 NOTE — Telephone Encounter (Addendum)
Patient replied to NP message asking if symptoms had resolved or if she was seen by PCM.  "No I was not able to see him  - he was booked until end of next week and I did not want to go to urgent care so I have been dealing with this the best I can - I did start taking that AZO pills which has helped some and I have been using the Vagisil cream.  I have sucked down water this week and stayed away from sugars and anything I thought would make this worst. No discharge or smell at all -   just the itching/burning  - and I try not to itch too much cause when  I do  - that is what causes the burning.  I have soaked in bathtub hot water with baking soda I read somewhere and that has helped me.  And now I am using a little corn starch which has helped.   It is not as bad as it was but it is not completely gone yet. 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 I will just keep fighting it"  Left message for patient " [name] I sent in refill diflucan '150mg'$  po take 1 now and may repeat in 72 hours if itching/discharge symptoms worsening #2 RF0 to your pharmacy of choice.  May continue plan of care  as you have been.  If symptoms not resolved I recommend testing with PCM still next week."  Patient left NP message thank you.

## 2022-07-14 ENCOUNTER — Ambulatory Visit: Payer: No Typology Code available for payment source | Admitting: Family Medicine

## 2022-07-14 ENCOUNTER — Encounter: Payer: Self-pay | Admitting: Family Medicine

## 2022-07-14 VITALS — BP 122/80 | HR 75 | Temp 97.2°F | Ht 66.0 in | Wt 293.0 lb

## 2022-07-14 DIAGNOSIS — G629 Polyneuropathy, unspecified: Secondary | ICD-10-CM

## 2022-07-14 DIAGNOSIS — N898 Other specified noninflammatory disorders of vagina: Secondary | ICD-10-CM

## 2022-07-14 DIAGNOSIS — R300 Dysuria: Secondary | ICD-10-CM

## 2022-07-14 DIAGNOSIS — R739 Hyperglycemia, unspecified: Secondary | ICD-10-CM

## 2022-07-14 LAB — POC URINALSYSI DIPSTICK (AUTOMATED)
Bilirubin, UA: NEGATIVE
Blood, UA: NEGATIVE
Glucose, UA: NEGATIVE
Ketones, UA: NEGATIVE
Leukocytes, UA: NEGATIVE
Nitrite, UA: NEGATIVE
Protein, UA: NEGATIVE
Spec Grav, UA: 1.02 (ref 1.010–1.025)
Urobilinogen, UA: 0.2 E.U./dL
pH, UA: 6 (ref 5.0–8.0)

## 2022-07-14 MED ORDER — GABAPENTIN 300 MG PO CAPS: 1200.0000 mg | ORAL_CAPSULE | Freq: Every day | ORAL | Status: DC

## 2022-07-14 MED ORDER — VITAMIN D3 125 MCG (5000 UT) PO CAPS: 5000.0000 [IU] | ORAL_CAPSULE | Freq: Every day | ORAL | Status: DC

## 2022-07-14 MED ORDER — FLUCONAZOLE 150 MG PO TABS: 150.0000 mg | ORAL_TABLET | ORAL | 0 refills | Status: DC

## 2022-07-14 NOTE — Progress Notes (Unsigned)
   She was tx'd with valtrex for shingles.  Then she had vaginal itching and irritation.  Took 2 doses of diflucan.  Some better, then had return of itching.  Had repeat dose of diflucan, helped for about 1 week.  Tried vagisil in the meantime with some mild relief.  No discharge.    Dysuria, external irritation.  U/a wnl, d/w pt.    More foot pain in the last few months, she increased gabapentin to '1200mg'$  at night with relief.  Mother with h/o RLS.    Meds, vitals, and allergies reviewed.  Per HPI unless specifically indicated in ROS section   GEN: nad, alert and oriented HEENT: mucous membranes moist NECK: supple CV: rrr.  PULM: ctab, no inc wob ABD: soft, +bs, suprapubic area tender EXT: no edema SKIN: no acute rash BACK: no CVA pain

## 2022-07-14 NOTE — Patient Instructions (Addendum)
You could try taking '300mg'$  gabapentin earlier in the day and then decrease to 600-'900mg'$  at night.  Sedation caution.   Go to the lab on the way out.   If you have mychart we'll likely use that to update you.    Take diflucan weekly.   Take care.  Glad to see you.

## 2022-07-15 LAB — COMPREHENSIVE METABOLIC PANEL
ALT: 27 U/L (ref 0–35)
AST: 22 U/L (ref 0–37)
Albumin: 4.3 g/dL (ref 3.5–5.2)
Alkaline Phosphatase: 78 U/L (ref 39–117)
BUN: 10 mg/dL (ref 6–23)
CO2: 27 mEq/L (ref 19–32)
Calcium: 9.4 mg/dL (ref 8.4–10.5)
Chloride: 102 mEq/L (ref 96–112)
Creatinine, Ser: 0.65 mg/dL (ref 0.40–1.20)
GFR: 96.2 mL/min (ref >60.00)
Glucose, Bld: 156 mg/dL — ABNORMAL HIGH (ref 70–99)
Potassium: 3.8 mEq/L (ref 3.5–5.1)
Sodium: 140 mEq/L (ref 135–145)
Total Bilirubin: 0.3 mg/dL (ref 0.2–1.2)
Total Protein: 7.1 g/dL (ref 6.0–8.3)

## 2022-07-15 LAB — CBC WITH DIFFERENTIAL/PLATELET
Basophils Absolute: 0.1 10*3/uL (ref 0.0–0.1)
Basophils Relative: 1.5 % (ref 0.0–3.0)
Eosinophils Absolute: 0.3 10*3/uL (ref 0.0–0.7)
Eosinophils Relative: 4.9 % (ref 0.0–5.0)
HCT: 37.5 % (ref 36.0–46.0)
Hemoglobin: 12.6 g/dL (ref 12.0–15.0)
Lymphocytes Relative: 29.9 % (ref 12.0–46.0)
Lymphs Abs: 2 10*3/uL (ref 0.7–4.0)
MCHC: 33.7 g/dL (ref 30.0–36.0)
MCV: 90.6 fl (ref 78.0–100.0)
Monocytes Absolute: 0.4 10*3/uL (ref 0.1–1.0)
Monocytes Relative: 6.2 % (ref 3.0–12.0)
Neutro Abs: 3.8 10*3/uL (ref 1.4–7.7)
Neutrophils Relative %: 57.5 % (ref 43.0–77.0)
Platelets: 298 10*3/uL (ref 150.0–400.0)
RBC: 4.14 Mil/uL (ref 3.87–5.11)
RDW: 15 % (ref 11.5–15.5)
WBC: 6.7 10*3/uL (ref 4.0–10.5)

## 2022-07-15 LAB — TSH: TSH: 1.96 u[IU]/mL (ref 0.35–5.50)

## 2022-07-15 LAB — HEMOGLOBIN A1C: Hgb A1c MFr Bld: 6.8 % — ABNORMAL HIGH (ref 4.6–6.5)

## 2022-07-15 LAB — VITAMIN B12: Vitamin B-12: 464 pg/mL (ref 211–911)

## 2022-07-16 DIAGNOSIS — N898 Other specified noninflammatory disorders of vagina: Secondary | ICD-10-CM | POA: Insufficient documentation

## 2022-07-16 NOTE — Assessment & Plan Note (Signed)
See notes on labs regarding wet prep and also to exclude diabetes, to make sure that is not contributing. Take diflucan weekly.  Update me if not better in the meantime.

## 2022-07-16 NOTE — Assessment & Plan Note (Signed)
She could try taking '300mg'$  gabapentin earlier in the day and then decrease to 600-'900mg'$  at night.  Sedation caution.   She will update me as needed.

## 2022-07-17 LAB — WET PREP FOR TRICH, YEAST, CLUE

## 2022-07-17 LAB — WET PREP BY MOLECULAR PROBE
Candida species: NOT DETECTED
Gardnerella vaginalis: NOT DETECTED
MICRO NUMBER:: 14233808
SPECIMEN QUALITY:: ADEQUATE
Trichomonas vaginosis: NOT DETECTED

## 2022-07-20 ENCOUNTER — Encounter: Payer: Self-pay | Admitting: Family Medicine

## 2022-07-20 DIAGNOSIS — E119 Type 2 diabetes mellitus without complications: Secondary | ICD-10-CM | POA: Insufficient documentation

## 2022-07-25 NOTE — Telephone Encounter (Signed)
Latest Reference Range & Units 07/14/22 15:02 07/14/22 15:43  Sodium 135 - 145 mEq/L  140  Potassium 3.5 - 5.1 mEq/L  3.8  Chloride 96 - 112 mEq/L  102  CO2 19 - 32 mEq/L  27  Glucose 70 - 99 mg/dL  156 (H)  BUN 6 - 23 mg/dL  10  Creatinine 0.40 - 1.20 mg/dL  0.65  Calcium 8.4 - 10.5 mg/dL  9.4  Alkaline Phosphatase 39 - 117 U/L  78  Albumin 3.5 - 5.2 g/dL  4.3  AST 0 - 37 U/L  22  ALT 0 - 35 U/L  27  Total Protein 6.0 - 8.3 g/dL  7.1  Total Bilirubin 0.2 - 1.2 mg/dL  0.3  GFR >60.00 mL/min  96.20  Vitamin B12 211 - 911 pg/mL  464  Comment   We received an AFFIRM transport device with a non-specific order. Based upon the specimen submitted, the BV/Vaginitis DNA screen was performed. If this is not what you intended to order, please contact your local client service representative immediately  so that we can adjust our billing appropriately. You may also inquire about alternative or additional testing.   WBC 4.0 - 10.5 K/uL  6.7  RBC 3.87 - 5.11 Mil/uL  4.14  Hemoglobin 12.0 - 15.0 g/dL  12.6  HCT 36.0 - 46.0 %  37.5  MCV 78.0 - 100.0 fl  90.6  MCHC 30.0 - 36.0 g/dL  33.7  RDW 11.5 - 15.5 %  15.0  Platelets 150.0 - 400.0 K/uL  298.0  Neutrophils 43.0 - 77.0 %  57.5  Lymphocytes 12.0 - 46.0 %  29.9  Monocytes Relative 3.0 - 12.0 %  6.2  Eosinophil 0.0 - 5.0 %  4.9  Basophil 0.0 - 3.0 %  1.5  NEUT# 1.4 - 7.7 K/uL  3.8  Lymphocyte # 0.7 - 4.0 K/uL  2.0  Monocyte # 0.1 - 1.0 K/uL  0.4  Eosinophils Absolute 0.0 - 0.7 K/uL  0.3  Basophils Absolute 0.0 - 0.1 K/uL  0.1  Hemoglobin A1C 4.6 - 6.5 %  6.8 (H)  TSH 0.35 - 5.50 uIU/mL  1.96  Source   CANCELED NOT GIVEN  STATUS:   FINAL  Trichomonas vaginosis   Not Detected  Candida species   Not Detected  Gardnerella vaginalis   Not Detected  WET PREP FOR TRICH, YEAST, CLUE   Rpt  Bilirubin, UA  neg   Clarity, UA  clear   Color, UA  yellow   Glucose Negative  Negative   Ketones, UA  neg   Leukocytes,UA Negative  Negative    Nitrite, UA  neg   pH, UA 5.0 - 8.0  6.0   Protein,UA Negative  Negative   Specific Gravity, UA 1.010 - 1.025  1.020   Urobilinogen, UA 0.2 or 1.0 E.U./dL 0.2   RBC, UA  neg   (H): Data is abnormally high Rpt: View report in Results Review for more information

## 2022-07-25 NOTE — Telephone Encounter (Signed)
Epic reviewed saw [facility] 07/14/22 and had wet prep performed and started on weekly diflucan dosing and to follow up prn

## 2022-08-26 ENCOUNTER — Encounter: Payer: Self-pay | Admitting: Registered Nurse

## 2022-08-26 ENCOUNTER — Telehealth: Payer: Self-pay | Admitting: Registered Nurse

## 2022-08-26 DIAGNOSIS — I10 Essential (primary) hypertension: Secondary | ICD-10-CM

## 2022-08-26 DIAGNOSIS — E785 Hyperlipidemia, unspecified: Secondary | ICD-10-CM

## 2022-08-26 DIAGNOSIS — G629 Polyneuropathy, unspecified: Secondary | ICD-10-CM

## 2022-08-26 NOTE — Telephone Encounter (Signed)
Patient last fill 04/15/22.  Last labs   Latest Reference Range & Units 09/27/21 08:21 01/07/22 12:58 07/14/22 27:78  BASIC METABOLIC PANEL   Rpt !   COMPREHENSIVE METABOLIC PANEL  Rpt !  Rpt !  Sodium 135 - 145 mEq/L 140 140 140  Potassium 3.5 - 5.1 mEq/L 4.1 4.0 3.8  Chloride 96 - 112 mEq/L 101 98 102  CO2 19 - 32 mEq/L 33 (H) 21 27  Glucose 70 - 99 mg/dL 115 (H) 120 (H) 156 (H)  BUN 6 - 23 mg/dL '10 11 10  '$ Creatinine 0.40 - 1.20 mg/dL 0.66 0.70 0.65  Calcium 8.4 - 10.5 mg/dL 9.5 9.8 9.4  BUN/Creatinine Ratio 9 - 23   16   eGFR >59 mL/min/1.73  100   Alkaline Phosphatase 39 - 117 U/L 89  78  Albumin 3.5 - 5.2 g/dL 4.0  4.3  AST 0 - 37 U/L 19  22  ALT 0 - 35 U/L 25  27  Total Protein 6.0 - 8.3 g/dL 6.9  7.1  Total Bilirubin 0.2 - 1.2 mg/dL 0.5  0.3  GFR >60.00 mL/min 96.39  96.20  Total CHOL/HDL Ratio  4    Cholesterol 0 - 200 mg/dL 182    HDL Cholesterol >39.00 mg/dL 40.50    Direct LDL mg/dL 117.0    NonHDL  141.34    Triglycerides 0.0 - 149.0 mg/dL 219.0 (H)    VLDL 0.0 - 40.0 mg/dL 43.8 (H)    VITD 30.00 - 100.00 ng/mL 30.85    Vitamin B12 211 - 911 pg/mL   464  Comment    We received an AFFIRM transport device with a non-specific order. Based upon the specimen submitted, the BV/Vaginitis DNA screen was performed. If this is not what you intended to order, please contact your local client service representative immediately  so that we can adjust our billing appropriately. You may also inquire about alternative or additional testing.   WBC 4.0 - 10.5 K/uL 8.8  6.7  RBC 3.87 - 5.11 Mil/uL 4.21  4.14  Hemoglobin 12.0 - 15.0 g/dL 12.6  12.6  HCT 36.0 - 46.0 % 38.0  37.5  MCV 78.0 - 100.0 fl 90.2  90.6  MCHC 30.0 - 36.0 g/dL 33.1  33.7  RDW 11.5 - 15.5 % 14.6  15.0  Platelets 150.0 - 400.0 K/uL 261.0  298.0  Neutrophils 43.0 - 77.0 % 60.9  57.5  Lymphocytes 12.0 - 46.0 % 27.2  29.9  Monocytes Relative 3.0 - 12.0 % 8.6  6.2  Eosinophil 0.0 - 5.0 % 2.2  4.9  Basophil 0.0 -  3.0 % 1.1  1.5  NEUT# 1.4 - 7.7 K/uL 5.4  3.8  Lymphocyte # 0.7 - 4.0 K/uL 2.4  2.0  Monocyte # 0.1 - 1.0 K/uL 0.8  0.4  Eosinophils Absolute 0.0 - 0.7 K/uL 0.2  0.3  Basophils Absolute 0.0 - 0.1 K/uL 0.1  0.1  Hemoglobin A1C 4.6 - 6.5 % 6.1  6.8 (H)  TSH 0.35 - 5.50 uIU/mL 3.29  1.96  !: Data is abnormal (H): Data is abnormally high Rpt: View report in Results Review for more information  Last BP sick visit 122/80 HR 75 07/14/2022 PCM office  Refilled 90 day supplies each from [facility].  Next labs due Feb 2024 lipids

## 2022-08-27 MED ORDER — GEMFIBROZIL 600 MG PO TABS: 600.0000 mg | ORAL_TABLET | Freq: Two times a day (BID) | ORAL | 0 refills | Status: DC

## 2022-08-27 MED ORDER — IBUPROFEN 800 MG PO TABS: 800.0000 mg | ORAL_TABLET | Freq: Every day | ORAL | 0 refills | Status: DC | PRN

## 2022-08-27 MED ORDER — HYDROCHLOROTHIAZIDE 12.5 MG PO TABS: 12.5000 mg | ORAL_TABLET | Freq: Every day | ORAL | 0 refills | Status: DC

## 2022-09-26 ENCOUNTER — Other Ambulatory Visit: Payer: Self-pay | Admitting: Family Medicine

## 2022-09-26 NOTE — Telephone Encounter (Signed)
Refill request for GABAPENTIN 300MG CAPSULES   LOV - 07/14/22 Next OV - 10/31/22 Last refill - 07/14/22 as No print

## 2022-09-27 NOTE — Telephone Encounter (Signed)
Sent. Thanks.   

## 2022-09-30 ENCOUNTER — Telehealth: Payer: Self-pay | Admitting: Registered Nurse

## 2022-09-30 DIAGNOSIS — K219 Gastro-esophageal reflux disease without esophagitis: Secondary | ICD-10-CM

## 2022-09-30 MED ORDER — OMEPRAZOLE 40 MG PO CPDR: 40.0000 mg | DELAYED_RELEASE_CAPSULE | Freq: Every day | ORAL | 2 refills | Status: DC

## 2022-09-30 NOTE — Telephone Encounter (Signed)
Per chart review last fill 04/15/22 [facility] omeprazole DR 47m po daily #90.  Last magnesium level not on file in epic ordered to be completed with next lab draw.  Dispensed to patient today 90 day supply.  Last office visit BP 122/80 met 2/3 requirements for 2025 Be Well RN KEvlyn Kannerto complete paperwork with patient when she returns onsite in 6 weeks.  A1c 6.25 Jun 2022.

## 2022-09-30 NOTE — Telephone Encounter (Signed)
Patient left message for NP all symptoms resolved "YES - it took me 4 weeks straight to get rid of that mess and I was so thankful that it went away - it was horrible!   But I am very - very careful with the sweets I eat I don't want that to ever come back again.   I go in March to have labs done and my physical with Dr Damita Dunnings."

## 2022-10-08 ENCOUNTER — Other Ambulatory Visit: Payer: Self-pay | Admitting: Family Medicine

## 2022-10-08 DIAGNOSIS — E119 Type 2 diabetes mellitus without complications: Secondary | ICD-10-CM

## 2022-10-23 ENCOUNTER — Other Ambulatory Visit: Payer: Self-pay | Admitting: Family Medicine

## 2022-10-23 ENCOUNTER — Encounter: Payer: Self-pay | Admitting: Family Medicine

## 2022-10-23 DIAGNOSIS — E559 Vitamin D deficiency, unspecified: Secondary | ICD-10-CM

## 2022-10-24 ENCOUNTER — Other Ambulatory Visit (INDEPENDENT_AMBULATORY_CARE_PROVIDER_SITE_OTHER): Payer: No Typology Code available for payment source

## 2022-10-24 DIAGNOSIS — E559 Vitamin D deficiency, unspecified: Secondary | ICD-10-CM

## 2022-10-24 DIAGNOSIS — E119 Type 2 diabetes mellitus without complications: Secondary | ICD-10-CM | POA: Diagnosis not present

## 2022-10-24 LAB — MICROALBUMIN / CREATININE URINE RATIO
Creatinine,U: 46.6 mg/dL
Microalb Creat Ratio: 1.5 mg/g (ref 0.0–30.0)
Microalb, Ur: <0.7 mg/dL (ref 0.0–1.9)

## 2022-10-24 LAB — HEMOGLOBIN A1C: Hgb A1c MFr Bld: 6.8 % — ABNORMAL HIGH (ref 4.6–6.5)

## 2022-10-24 LAB — LIPID PANEL
Cholesterol: 176 mg/dL (ref 0–200)
HDL: 38.8 mg/dL — ABNORMAL LOW (ref >39.00)
NonHDL: 137.35
Total CHOL/HDL Ratio: 5
Triglycerides: 250 mg/dL — ABNORMAL HIGH (ref 0.0–149.0)
VLDL: 50 mg/dL — ABNORMAL HIGH (ref 0.0–40.0)

## 2022-10-24 LAB — LDL CHOLESTEROL, DIRECT: Direct LDL: 107 mg/dL

## 2022-10-25 LAB — VITAMIN D 25 HYDROXY (VIT D DEFICIENCY, FRACTURES): VITD: 28.48 ng/mL — ABNORMAL LOW (ref 30.00–100.00)

## 2022-10-31 ENCOUNTER — Encounter: Payer: Self-pay | Admitting: Family Medicine

## 2022-10-31 ENCOUNTER — Ambulatory Visit (INDEPENDENT_AMBULATORY_CARE_PROVIDER_SITE_OTHER): Payer: No Typology Code available for payment source | Admitting: Family Medicine

## 2022-10-31 ENCOUNTER — Ambulatory Visit (INDEPENDENT_AMBULATORY_CARE_PROVIDER_SITE_OTHER)
Admission: RE | Admit: 2022-10-31 | Discharge: 2022-10-31 | Disposition: A | Discharge status: Home / Self Care | Payer: No Typology Code available for payment source | Source: Ambulatory Visit | Attending: Family Medicine | Admitting: Family Medicine

## 2022-10-31 VITALS — BP 122/80 | HR 80 | Temp 97.7°F | Ht 66.0 in | Wt 286.0 lb

## 2022-10-31 DIAGNOSIS — Z7189 Other specified counseling: Secondary | ICD-10-CM

## 2022-10-31 DIAGNOSIS — E559 Vitamin D deficiency, unspecified: Secondary | ICD-10-CM

## 2022-10-31 DIAGNOSIS — R202 Paresthesia of skin: Secondary | ICD-10-CM

## 2022-10-31 DIAGNOSIS — R059 Cough, unspecified: Secondary | ICD-10-CM

## 2022-10-31 DIAGNOSIS — E782 Mixed hyperlipidemia: Secondary | ICD-10-CM

## 2022-10-31 DIAGNOSIS — I10 Essential (primary) hypertension: Secondary | ICD-10-CM

## 2022-10-31 DIAGNOSIS — Z0000 Encounter for general adult medical examination without abnormal findings: Secondary | ICD-10-CM | POA: Diagnosis not present

## 2022-10-31 DIAGNOSIS — E119 Type 2 diabetes mellitus without complications: Secondary | ICD-10-CM

## 2022-10-31 DIAGNOSIS — G43909 Migraine, unspecified, not intractable, without status migrainosus: Secondary | ICD-10-CM

## 2022-10-31 DIAGNOSIS — G629 Polyneuropathy, unspecified: Secondary | ICD-10-CM

## 2022-10-31 DIAGNOSIS — E039 Hypothyroidism, unspecified: Secondary | ICD-10-CM

## 2022-10-31 MED ORDER — VITAMIN D (ERGOCALCIFEROL) 1.25 MG (50000 UNIT) PO CAPS: 50000.0000 [IU] | ORAL_CAPSULE | ORAL | 0 refills | Status: DC

## 2022-10-31 MED ORDER — OMEPRAZOLE 40 MG PO CPDR: 40.0000 mg | DELAYED_RELEASE_CAPSULE | Freq: Every day | ORAL | 3 refills | Status: DC

## 2022-10-31 MED ORDER — HYDROCHLOROTHIAZIDE 12.5 MG PO TABS: 12.5000 mg | ORAL_TABLET | Freq: Every day | ORAL | 3 refills | Status: DC

## 2022-10-31 MED ORDER — GEMFIBROZIL 600 MG PO TABS: 600.0000 mg | ORAL_TABLET | Freq: Two times a day (BID) | ORAL | 3 refills | Status: DC

## 2022-10-31 MED ORDER — GABAPENTIN 100 MG PO CAPS: ORAL_CAPSULE | ORAL | 3 refills | Status: DC

## 2022-10-31 MED ORDER — IBUPROFEN 800 MG PO TABS: 800.0000 mg | ORAL_TABLET | ORAL | 3 refills | Status: DC | PRN

## 2022-10-31 MED ORDER — GABAPENTIN 300 MG PO CAPS: ORAL_CAPSULE | ORAL | 3 refills | Status: DC

## 2022-10-31 NOTE — Progress Notes (Unsigned)
CPE- See plan.  Routine anticipatory guidance given to patient.  See health maintenance.  The possibility exists that previously documented standard health maintenance information may have been brought forward from a previous encounter into this note.  If needed, that same information has been updated to reflect the current situation based on today's encounter.    Tetanus 2018 Flu 2023 PNA d/w pt.   Shingles 2021 covid vaccine done Pap not due.  S/p hysterectomy.   Mammogram 2020, d/w pt.  See avs.   DXA not due.   Diet and exercise d/w pt.  She is working on both.   Living will d/w pt.  Would have her husband designated if patient were incapacitated.   Vit D low, d/w pt.  Has been taking 5000 units a day.    R arm tingling, positional.  No weakness.  Going on for a few weeks.  Intermittent.  Normal grip.    Advair helps with cough.  Still on singulair.  Rare SABA use.  compliant.  Cough worse off meds.     Hypothyroidism.  No ADE on med.  Compliant.  TSh wnl.  No neck mass.   Diabetes:  Using medications without difficulties: yes Hypoglycemic episodes:no sx Hyperglycemic episodes:no sx Feet problems: see below.   Blood Sugars averaging: not checked.     Hypertension:               Using medication without problems or lightheadedness: yes Chest pain with exertion: no Edema: some BLE edema at baseline.   Short of breath: no   Elevated Cholesterol: Using medications without problems: yes Muscle aches: no Diet compliance: d/w pt.  Exercise: d/w pt.     Migraine d/w pt.  No recent events.    Neuropathy pain is better with gabapentin but she is still putting up with symptoms at night.    She scraped her forehead this AM, fixing her hair.   PMH and SH reviewed  Meds, vitals, and allergies reviewed.   ROS: Per HPI.  Unless specifically indicated otherwise in HPI, the patient denies:  General: fever. Eyes: acute vision changes ENT: sore throat Cardiovascular: chest  pain Respiratory: SOB GI: vomiting GU: dysuria Musculoskeletal: acute back pain Derm: acute rash Neuro: acute motor dysfunction Psych: worsening mood Endocrine: polydipsia Heme: bleeding Allergy: hayfever  GEN: nad, alert and oriented HEENT: mucous membranes moist NECK: supple w/o LA CV: rrr. PULM: ctab, no inc wob ABD: soft, +bs EXT: trace BLE edema SKIN: ~1cm flat irritated lesion with a superficial scrape on the forehead.   R arm paresthesia w/o weakness.   CN 2-12 wnl.  S/S wnl o/w x4  Diabetic foot exam: Normal inspection No skin breakdown No calluses  Normal DP pulses Normal sensation to light touch and monofilament Nails normal

## 2022-10-31 NOTE — Patient Instructions (Addendum)
You can call for a mammogram at [facility].  [place] before a visit in about 3 months.  Change to weekly vit D.  Take care.  Glad to see you. Go to the lab on the way out.   If you have mychart we'll likely use that to update you.     If the skin spot doesn't heal over 1 in week then let me know.

## 2022-11-02 DIAGNOSIS — R202 Paresthesia of skin: Secondary | ICD-10-CM | POA: Insufficient documentation

## 2022-11-02 NOTE — Assessment & Plan Note (Signed)
  Advair helps with cough.  Still on singulair.  Rare SABA use.  compliant.  Cough worse off meds.   Continue Advair and singular as is with as needed albuterol.

## 2022-11-02 NOTE — Assessment & Plan Note (Signed)
Continue carvedilol hydrochlorothiazide and continue work on diet and exercise.

## 2022-11-02 NOTE — Assessment & Plan Note (Signed)
Continue work on diet and exercise and recheck A1c in about 3 months.  See after visit summary.

## 2022-11-02 NOTE — Assessment & Plan Note (Signed)
Continue gemfibrozil and continue work on diet and exercise.

## 2022-11-02 NOTE — Assessment & Plan Note (Signed)
Fortunately no recent events.  Continue carvedilol and gabapentin at baseline.

## 2022-11-02 NOTE — Assessment & Plan Note (Signed)
Vit D low, d/w pt.  Has been taking 5000 units a day.  Changed to 50,000 units weekly and then recheck vitamin D level after that.

## 2022-11-02 NOTE — Assessment & Plan Note (Signed)
Living will d/w pt. Would have her husband designated if patient were incapacitated. 

## 2022-11-02 NOTE — Assessment & Plan Note (Signed)
Continue Armour Thyroid.  TSH normal.

## 2022-11-02 NOTE — Assessment & Plan Note (Signed)
Without weakness.  Check plain films.  At this point still okay for outpatient follow-up.  Routine cautions given to patient.

## 2022-11-02 NOTE — Assessment & Plan Note (Signed)
Tetanus 2018 Flu 2023 PNA d/w pt.   Shingles 2021 covid vaccine done Pap not due.  S/p hysterectomy.   Mammogram 2020, d/w pt.  See avs.   DXA not due.   Diet and exercise d/w pt.  She is working on both.   Living will d/w pt.  Would have her husband designated if patient were incapacitated.

## 2022-11-02 NOTE — Assessment & Plan Note (Signed)
Neuropathy pain is better with gabapentin but she is still putting up with symptoms at night.  Would continue as is for now.

## 2022-11-04 NOTE — Addendum Note (Signed)
Addended by: Tonia Ghent on: 11/04/2022 06:29 AM   Modules accepted: Orders

## 2022-11-12 ENCOUNTER — Ambulatory Visit: Payer: Self-pay | Admitting: Occupational Medicine

## 2022-11-12 DIAGNOSIS — Z0000 Encounter for general adult medical examination without abnormal findings: Secondary | ICD-10-CM

## 2022-11-12 NOTE — Progress Notes (Signed)
Be well insurance premium discount evaluation:   Patient sent dietitian link in email. Also PCP following up in June in regards to A1c.   Patient completed PCM office visit epic reviewed by RN Kimrey and transcribed. Labs  Tobacco attestation signed. [facility] ROI formed signed. Forms placed in the chart.   Patient given handouts for [facility] pharmacies and discount drugs list,MyChart, Tele doc setup, Tele doc Behavior[facility], [place] counseling and [facility] counseling.  What to do for infectious illness protocol. Given handout for list of medications that can be filled at [facility]. Given Clinic hours and Clinic Email.

## 2022-11-15 NOTE — Telephone Encounter (Signed)
Signed Be Well paperwork 11/13/22  BP 122/80 LDL 127 Hgba1c 5.8 weight 286lbs last year BP 130/82, LDL 117 HgbA1C 6.1 and weight 286 lbs.  HR team and patient notified by RN Kimrey patient met insurance discount requirements.

## 2022-11-18 ENCOUNTER — Telehealth: Payer: Self-pay | Admitting: Registered Nurse

## 2022-11-18 ENCOUNTER — Encounter: Payer: Self-pay | Admitting: Registered Nurse

## 2022-11-18 DIAGNOSIS — G629 Polyneuropathy, unspecified: Secondary | ICD-10-CM

## 2022-11-18 DIAGNOSIS — E785 Hyperlipidemia, unspecified: Secondary | ICD-10-CM

## 2022-11-18 DIAGNOSIS — I10 Essential (primary) hypertension: Secondary | ICD-10-CM

## 2022-11-18 DIAGNOSIS — K219 Gastro-esophageal reflux disease without esophagitis: Secondary | ICD-10-CM

## 2022-11-18 MED ORDER — HYDROCHLOROTHIAZIDE 12.5 MG PO TABS
12.5000 mg | ORAL_TABLET | Freq: Every day | ORAL | 3 refills | Status: DC
Start: 2022-11-18 — End: 2024-02-15

## 2022-11-18 MED ORDER — IBUPROFEN 800 MG PO TABS: 800.0000 mg | ORAL_TABLET | Freq: Every day | ORAL | 3 refills | Status: AC | PRN

## 2022-11-18 MED ORDER — GEMFIBROZIL 600 MG PO TABS: 600.0000 mg | ORAL_TABLET | Freq: Two times a day (BID) | ORAL | 3 refills | Status: DC

## 2022-11-18 MED ORDER — IBUPROFEN 800 MG PO TABS: 800.0000 mg | ORAL_TABLET | Freq: Three times a day (TID) | ORAL | 3 refills | Status: DC | PRN

## 2022-11-18 MED ORDER — OMEPRAZOLE 40 MG PO CPDR: 40.0000 mg | DELAYED_RELEASE_CAPSULE | Freq: Every day | ORAL | 3 refills | Status: DC

## 2022-11-18 NOTE — Telephone Encounter (Unsigned)
Patient requested [facility] fills omeprazole DR 40mg  po daily #90 RF3, lisinopril 12.5mg  po daily #90 RF3 and gemfibrozil 600mg  po BID #180 RF3 and ibuprofen 800mg  po daily prn pain #90 RF3 from [facility] formulary needed new Rx from clinic provider.  Last labs Nov 2023 and Mar 2024 stable repeat in 1 year  Patient given 90 day supply of each medication from [facility] today.  Last filled omeprazole 09/30/22, gemfibrozil, ibuprofen and hydrochlorothiazide last filled 08/26/22.   Latest Reference Range & Units 07/14/22 15:43 10/24/22 07:41  COMPREHENSIVE METABOLIC PANEL  Rpt !   Sodium 135 - 145 mEq/L 140   Potassium 3.5 - 5.1 mEq/L 3.8   Chloride 96 - 112 mEq/L 102   CO2 19 - 32 mEq/L 27   Glucose 70 - 99 mg/dL 156 (H)   BUN 6 - 23 mg/dL 10   Creatinine 0.40 - 1.20 mg/dL 0.65   Calcium 8.4 - 10.5 mg/dL 9.4   Alkaline Phosphatase 39 - 117 U/L 78   Albumin 3.5 - 5.2 g/dL 4.3   AST 0 - 37 U/L 22   ALT 0 - 35 U/L 27   Total Protein 6.0 - 8.3 g/dL 7.1   Total Bilirubin 0.2 - 1.2 mg/dL 0.3   GFR >60.00 mL/min 96.20   Total CHOL/HDL Ratio   5  Cholesterol 0 - 200 mg/dL  176  HDL Cholesterol >39.00 mg/dL  38.80 (L)  Direct LDL mg/dL  107.0  MICROALB/CREAT RATIO 0.0 - 30.0 mg/g  1.5  NonHDL   137.35  Triglycerides 0.0 - 149.0 mg/dL  250.0 (H)  VLDL 0.0 - 40.0 mg/dL  50.0 (H)  VITD 30.00 - 100.00 ng/mL  28.48 (L)  Vitamin B12 211 - 911 pg/mL 464   Comment  We received an AFFIRM transport device with a non-specific order. Based upon the specimen submitted, the BV/Vaginitis DNA screen was performed. If this is not what you intended to order, please contact your local client service representative immediately  so that we can adjust our billing appropriately. You may also inquire about alternative or additional testing.    WBC 4.0 - 10.5 K/uL 6.7   RBC 3.87 - 5.11 Mil/uL 4.14   Hemoglobin 12.0 - 15.0 g/dL 12.6   HCT 36.0 - 46.0 % 37.5   MCV 78.0 - 100.0 fl 90.6   MCHC 30.0 - 36.0 g/dL 33.7    RDW 11.5 - 15.5 % 15.0   Platelets 150.0 - 400.0 K/uL 298.0   Neutrophils 43.0 - 77.0 % 57.5   Lymphocytes 12.0 - 46.0 % 29.9   Monocytes Relative 3.0 - 12.0 % 6.2   Eosinophil 0.0 - 5.0 % 4.9   Basophil 0.0 - 3.0 % 1.5   NEUT# 1.4 - 7.7 K/uL 3.8   Lymphocyte # 0.7 - 4.0 K/uL 2.0   Monocyte # 0.1 - 1.0 K/uL 0.4   Eosinophils Absolute 0.0 - 0.7 K/uL 0.3   Basophils Absolute 0.0 - 0.1 K/uL 0.1   Hemoglobin A1C 4.6 - 6.5 % 6.8 (H) 6.8 (H)  TSH 0.35 - 5.50 uIU/mL 1.96   !: Data is abnormal (H): Data is abnormally high (L): Data is abnormally low Rpt: View report in Results Review for more information

## 2022-12-04 ENCOUNTER — Other Ambulatory Visit: Payer: Self-pay | Admitting: Family Medicine

## 2022-12-04 ENCOUNTER — Encounter: Payer: Self-pay | Admitting: Family Medicine

## 2022-12-04 DIAGNOSIS — N898 Other specified noninflammatory disorders of vagina: Secondary | ICD-10-CM

## 2022-12-04 MED ORDER — FLUCONAZOLE 150 MG PO TABS: 150.0000 mg | ORAL_TABLET | ORAL | 0 refills | Status: DC

## 2022-12-08 ENCOUNTER — Encounter: Payer: Self-pay | Admitting: Family Medicine

## 2022-12-09 ENCOUNTER — Telehealth: Payer: Self-pay | Admitting: Physical Therapy

## 2022-12-09 NOTE — Telephone Encounter (Signed)
Called pt to inquire about moving up earlier in the schedule. She said she is unable to come in today because of need to be at work and she would like to keep her original apt.

## 2022-12-11 ENCOUNTER — Other Ambulatory Visit: Payer: Self-pay

## 2022-12-11 ENCOUNTER — Ambulatory Visit: Payer: No Typology Code available for payment source | Attending: Family Medicine | Admitting: Physical Therapy

## 2022-12-11 DIAGNOSIS — M25511 Pain in right shoulder: Secondary | ICD-10-CM | POA: Diagnosis present

## 2022-12-11 DIAGNOSIS — G8929 Other chronic pain: Secondary | ICD-10-CM | POA: Diagnosis present

## 2022-12-11 DIAGNOSIS — R202 Paresthesia of skin: Secondary | ICD-10-CM | POA: Diagnosis not present

## 2022-12-11 DIAGNOSIS — M5412 Radiculopathy, cervical region: Secondary | ICD-10-CM | POA: Diagnosis present

## 2022-12-11 NOTE — Therapy (Addendum)
OUTPATIENT PHYSICAL THERAPY CERVICAL and Shoulder EVALUATION/ Discharge Summary   Discharge Summary: Pt has since decided to discharge from PT due caretaker needs. She will reach out to office when she is ready to resume PT again.   Patient Name: [name] MRN: [id number] DOB:09-04-1960, 62 y.o., female Today's Date: 12/11/2022  END OF SESSION:  PT End of Session - 12/11/22 1839     Visit Number 1    Number of Visits 20    Date for PT Re-Evaluation 02/19/23    Authorization Type [employer] 2024 (1500 deductible)    Authorization Time Period Medical Neccisity    Authorization - Visit Number 1    Authorization - Number of Visits 20    Progress Note Due on Visit 10    PT Start Time 1415    PT Stop Time 1500    PT Time Calculation (min) 45 min    Activity Tolerance Patient tolerated treatment well    Behavior During Therapy WFL for tasks assessed/performed             Past Medical History:  Diagnosis Date   Anxiety    Diabetes mellitus without complication (HCC)    History of colon polyps    History of eosinophilia    ESOPHAGEAL   Hyperlipidemia    Hypertension    Hypothyroidism    Migraine    Neuropathy    Nocturia    Pelvic prolapse    Seasonal allergies    Urgency of urination    Vitamin D deficiency    Wears glasses    Past Surgical History:  Procedure Laterality Date   ABDOMINAL HYSTERECTOMY  2001   w/  Bladder Suspension   ANTERIOR AND POSTERIOR REPAIR WITH SACROSPINOUS FIXATION N/A 07/24/2014   Procedure: SACROSPINOUS LIGAMENT SUSPENSION ;  Surgeon: Freddy Finner, MD;  Location: [facility] [place];  Service: Gynecology;  Laterality: N/A;   COLONOSCOPY W/ POLYPECTOMY  04-25-2014   LAPAROSCOPIC CHOLECYSTECTOMY  2008   VAGINAL HYSTERECTOMY N/A 07/24/2014   Procedure: RESECTION OF CERVIX ;  Surgeon: Freddy Finner, MD;  Location: [facility];  Service: Gynecology;  Laterality: N/A;   Patient Active Problem List   Diagnosis Date Noted    Arm paresthesia, right 11/02/2022   Diabetes mellitus without complication (HCC) 07/20/2022   Vaginal irritation 07/16/2022   Chest pain of uncertain etiology 01/28/2022   Migraine 10/03/2020   LFT elevation 10/03/2020   Eustachian tube dysfunction 10/03/2020   Back pain 09/26/2019   Edema 09/26/2019   Left knee pain 07/04/2018   Essential hypertension 09/13/2017   Advance care planning 09/13/2017   Cough 12/18/2016   Postmenopausal HRT (hormone replacement therapy) 12/17/2015   Hypothyroidism 12/17/2015   Rash 10/30/2015   Neuropathy 03/24/2014   Eosinophilia 06/06/2013   Menopausal syndrome (hot flashes) 03/04/2013   Obesity 03/31/2011   Routine general medical examination at a health care facility 03/31/2011   Hyperlipidemia    Anxiety    Vitamin D deficiency     PCP: Dr. Crawford Givens   REFERRING PROVIDER: Dr. Crawford Givens   REFERRING DIAG: Right shoulder Parasthesia, Right shoulder Pain, Right sided cervical radiculopathy   THERAPY DIAG:  Chronic right shoulder pain - Plan: PT plan of care cert/re-cert  Radiculopathy, cervical region - Plan: PT plan of care cert/re-cert  Rationale for Evaluation and Treatment: Rehabilitation  ONSET DATE: Aug 11 2022   SUBJECTIVE:  SUBJECTIVE STATEMENT: See pertinent history  Hand dominance: Left  PERTINENT HISTORY:  Pt reports that she has numbness and tingling that goes down her right arm and right leg and pain that spreads throughout the bicep. Numbness and tingling go to all her fingers on her left hand. She also has neuropathy in both of her feet. She describes that the pain mostly occurs when she is sitting down. 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 Pt also reports have right sided leg pain that spreads down lateral side of hip  from her low back to her knee. Her husband recently had an accident where she now has to do all the physical work around the house like mowing the lawn and tilling garden.   PAIN:  Are you having pain? Yes: NPRS scale: 5-6/10 Pain location: Lateral side of right arm  Pain description: Numbness and tingling along lateral side of arm  Aggravating factors: Sitting  Relieving factors: Horse rub bought at [employer] and leaning back wards   PRECAUTIONS: None  WEIGHT BEARING RESTRICTIONS: No  FALLS:  Has patient fallen in last 6 months? No  LIVING ENVIRONMENT: Lives with: lives with their spouse Lives in: House/apartment Stairs: No Has following equipment at home: None  OCCUPATION: Works at a desk   PLOF: Independent  PATIENT GOALS: To make pain tolerable in her right arm to where she can work at her job without being in pain all day.  NEXT MD VISIT: February 09, 2023   OBJECTIVE:   VITALS: BP 129/64 hr 78 SpO2 95   DIAGNOSTIC FINDINGS:  CLINICAL DATA:  Right arm paresthesia.   EXAM: CERVICAL SPINE - COMPLETE 4+ VIEW   COMPARISON:  None Available.   FINDINGS: There is no evidence of cervical spine fracture or prevertebral soft tissue swelling. There is mild anterolisthesis at C4-C5 and mild retrolisthesis at C5-C6. There is loss of normal cervical lordosis. There is mild neural foraminal narrowing at C4-C5 on the right. The visualized portion of the dens is intact and lateral masses are symmetric.   IMPRESSION: Mild degenerative changes in the cervical spine.     Electronically Signed   By: Thornell Sartorius M.D.   On: 11/03/2022 04:30  PATIENT SURVEYS:  FOTO 62  COGNITION: Overall cognitive status: Within functional limits for tasks assessed  SENSATION: Light touch: Impaired   numbness and tingling down lateral side of arm   POSTURE: rounded shoulders and forward head  PALPATION: Right superior deltoid TTP and lateral deltoid TTP      CERVICAL ROM: No  diplopia, Dysphagia, Dysarthria, Nystagmus or Nausea   Active ROM A/PROM (deg) eval  Flexion 45  Extension 45  Right lateral flexion 45  Left lateral flexion 45  Right rotation 60  Left rotation 60   (Blank rows = not tested)    UPPER EXTREMITY ROM:  WNL for bilateral shoulder   Active ROM Right eval Left eval  Shoulder flexion    Shoulder extension    Shoulder abduction    Shoulder adduction    Shoulder extension    Shoulder internal rotation    Shoulder external rotation    Elbow flexion    Elbow extension    Wrist flexion    Wrist extension    Wrist ulnar deviation    Wrist radial deviation    Wrist pronation    Wrist supination     (Blank rows = not tested)  UPPER EXTREMITY MMT:  MMT Right eval Left eval  Shoulder flexion 4+ 4+  Shoulder extension    Shoulder abduction 4+ 4+  Shoulder adduction    Shoulder internal rotation    Shoulder external rotation 4* 4+  Middle trapezius    Lower trapezius    Elbow flexion 4+ 4+  Elbow extension 4+ 4+  Wrist flexion 4 4  Wrist extension 4 4  Wrist ulnar deviation    Wrist radial deviation    Wrist pronation    Wrist supination    Grip strength Good  Good   (Blank rows = not tested)  CERVICAL SPECIAL TESTS:  Upper limb tension test (ULTT): Positive  , Spurling's + on RUE , Bakody's on RUE -positive SHOULDER SPECIAL TESTS: Neer's positive on RUE, Resisted ER on RUE -positive  , Painful Arc-positive on RUE  FUNCTIONAL TESTS:  None performed   TODAY'S TREATMENT:                                                                                                                              DATE:   12/11/22  Chin tucks 1 x 10  D2 PNF Flexion on LUE 1 x 10  Shoulder Horizontal Abduction at 90 degrees 1 x 10  Median nerve flossing of LUE 1 x 10  Seated Scapular Retraction 1 x 10   PATIENT EDUCATION:  Education details: form and technique for correct performance of  exercise  Person educated: Patient Education method: Explanation, Demonstration, Verbal cues, and Handouts Education comprehension: verbalized understanding, returned demonstration, and verbal cues required  HOME EXERCISE PROGRAM: Access Code: [email or web address] URL: [email or web address] Date: 12/11/2022 Prepared by: Ellin Goodie  Exercises - Median Nerve Flossing - Tray  - 1 x daily - 10 reps - Seated Cervical Retraction  - 1 x daily - 3 sets - 10 reps - Standing Shoulder Horizontal Abduction with Resistance  - 3 x weekly - 3 sets - 10 reps - Shoulder PNF D2 Flexion  - 3 x weekly - 3 sets - 10 reps - Seated Scapular Retraction  - 3 x weekly - 3 sets - 10 reps  Patient Education - Shoulder Impingement  ASSESSMENT:  CLINICAL IMPRESSION: Patient is a 62 y.o. white female who was seen today for physical therapy evaluation and treatment for right shoulder pain when reaching overhead and right lateral shoulder paraesthesia. She shows signs and symptoms indicative of right sided cervical radiculopathy with unilateral paraesthesia, improvement of paraesthesia when side bending head away from right side and positive Bakody's sign. Myotomal weakness ruled out. Patient also shows signs and symptoms of right shoulder impingement with right shoulder pain when reaching overhead and with internal rotation and resisted ER. It was difficult to determine given the time limitations of visit which rotator cuff tendons were most affected so further screening needed. Due to these pathologies, pt demonstrates decreased UE function and parascapular strength that are making it difficult for her to perform her  job and caregiver tasks like laundry and typing on a key board for prolong periods of time. She will benefit from skilled PT to address these aforementioned deficits and to return to RUE to prior level of function.   OBJECTIVE IMPAIRMENTS: decreased strength, impaired UE functional use, obesity, and  pain.   ACTIVITY LIMITATIONS: dressing, reach over head, hygiene/grooming, and caring for others  PARTICIPATION LIMITATIONS: cleaning, laundry, and yard work  PERSONAL FACTORS: Fitness are also affecting patient's functional outcome.   REHAB POTENTIAL: Good  CLINICAL DECISION MAKING: Stable/uncomplicated  EVALUATION COMPLEXITY: Low   GOALS: Goals reviewed with patient? No  SHORT TERM GOALS: Target date: 12/25/2022  Pt will be independent with HEP in order to improve strength and balance in order to decrease fall risk and improve function at home and work. Baseline: NT  Goal status: INITIAL    LONG TERM GOALS: Target date: 02/19/2023  Patient will have improved function and activity level as evidenced by an increase in FOTO score by 10 points or more.  Baseline: 62/100 with target of 69  Goal status: INITIAL  2.  Patient will improve shoulder and parascapular strength by 1/3 grade mmt (4- to 4) for improved cervical stability and reduction of symptoms with overhead activity. Baseline: NT    Goal status: INITIAL   PLAN:  PT FREQUENCY: 1-2x/week  PT DURATION: 10 weeks  PLANNED INTERVENTIONS: Therapeutic exercises, Patient/Family education, Joint mobilization, Joint manipulation, Aquatic Therapy, Electrical stimulation, Spinal manipulation, Spinal mobilization, Cryotherapy, Moist heat, Manual therapy, and Re-evaluation  PLAN FOR NEXT SESSION: Screen for RTC pathologies. Progress parascapular and shoulder strength   Ellin Goodie PT, DPT  12/11/2022, 6:41 PM

## 2022-12-12 ENCOUNTER — Other Ambulatory Visit: Payer: Self-pay | Admitting: Family Medicine

## 2022-12-14 NOTE — Progress Notes (Signed)
Physician Signature: Crawford Givens Date:___04/28/24 Time:_9:52 AM

## 2022-12-15 ENCOUNTER — Encounter: Payer: No Typology Code available for payment source | Admitting: Physical Therapy

## 2022-12-22 ENCOUNTER — Ambulatory Visit: Payer: No Typology Code available for payment source | Admitting: Physical Therapy

## 2022-12-24 ENCOUNTER — Ambulatory Visit: Payer: No Typology Code available for payment source | Admitting: Physical Therapy

## 2022-12-29 ENCOUNTER — Ambulatory Visit: Payer: No Typology Code available for payment source | Admitting: Physical Therapy

## 2022-12-31 ENCOUNTER — Ambulatory Visit: Payer: No Typology Code available for payment source | Admitting: Physical Therapy

## 2023-01-05 ENCOUNTER — Encounter: Payer: No Typology Code available for payment source | Admitting: Physical Therapy

## 2023-01-08 ENCOUNTER — Encounter: Payer: No Typology Code available for payment source | Admitting: Physical Therapy

## 2023-01-14 ENCOUNTER — Encounter: Payer: No Typology Code available for payment source | Admitting: Physical Therapy

## 2023-01-19 ENCOUNTER — Encounter: Payer: No Typology Code available for payment source | Admitting: Physical Therapy

## 2023-01-21 ENCOUNTER — Encounter: Payer: No Typology Code available for payment source | Admitting: Physical Therapy

## 2023-01-26 ENCOUNTER — Encounter: Payer: No Typology Code available for payment source | Admitting: Physical Therapy

## 2023-01-26 ENCOUNTER — Other Ambulatory Visit (INDEPENDENT_AMBULATORY_CARE_PROVIDER_SITE_OTHER): Payer: No Typology Code available for payment source

## 2023-01-26 DIAGNOSIS — E119 Type 2 diabetes mellitus without complications: Secondary | ICD-10-CM

## 2023-01-26 DIAGNOSIS — E559 Vitamin D deficiency, unspecified: Secondary | ICD-10-CM

## 2023-01-26 LAB — HEMOGLOBIN A1C: Hgb A1c MFr Bld: 7.1 % — ABNORMAL HIGH (ref 4.6–6.5)

## 2023-01-26 LAB — VITAMIN D 25 HYDROXY (VIT D DEFICIENCY, FRACTURES): VITD: 36.86 ng/mL (ref 30.00–100.00)

## 2023-01-28 ENCOUNTER — Encounter: Payer: No Typology Code available for payment source | Admitting: Physical Therapy

## 2023-01-28 LAB — HM DIABETES EYE EXAM

## 2023-02-02 ENCOUNTER — Other Ambulatory Visit: Payer: Self-pay | Admitting: Family Medicine

## 2023-02-02 ENCOUNTER — Encounter: Payer: No Typology Code available for payment source | Admitting: Physical Therapy

## 2023-02-02 DIAGNOSIS — J302 Other seasonal allergic rhinitis: Secondary | ICD-10-CM

## 2023-02-03 ENCOUNTER — Encounter: Payer: Self-pay | Admitting: Registered Nurse

## 2023-02-03 ENCOUNTER — Telehealth: Payer: Self-pay | Admitting: Registered Nurse

## 2023-02-03 DIAGNOSIS — I10 Essential (primary) hypertension: Secondary | ICD-10-CM

## 2023-02-03 DIAGNOSIS — E785 Hyperlipidemia, unspecified: Secondary | ICD-10-CM

## 2023-02-03 DIAGNOSIS — K219 Gastro-esophageal reflux disease without esophagitis: Secondary | ICD-10-CM

## 2023-02-03 NOTE — Telephone Encounter (Signed)
Patient requested [facility] fills omeprazole DR 40mg  po daily #90 RF2, lisinopril 12.5mg  po daily #90 RF2 and gemfibrozil 600mg  po BID #180 RF2 and ibuprofen 800mg  po daily prn pain #90 RF2 from [facility] formulary provider.  Last labs Nov 2023 and Mar 2024 stable repeat in 1 year  Patient given 90 day supply of each medication from [facility] today.  Last filled omeprazole 11/18/2022, gemfibrozil, ibuprofen and hydrochlorothiazide      Latest Reference Range & Units 07/14/22 15:43 10/24/22 07:41  COMPREHENSIVE METABOLIC PANEL   Rpt !    Sodium 135 - 145 mEq/L 140    Potassium 3.5 - 5.1 mEq/L 3.8    Chloride 96 - 112 mEq/L 102    CO2 19 - 32 mEq/L 27    Glucose 70 - 99 mg/dL 161 (H)    BUN 6 - 23 mg/dL 10    Creatinine 0.96 - 1.20 mg/dL 0.45    Calcium 8.4 - 40.9 mg/dL 9.4    Alkaline Phosphatase 39 - 117 U/L 78    Albumin 3.5 - 5.2 g/dL 4.3    AST 0 - 37 U/L 22    ALT 0 - 35 U/L 27    Total Protein 6.0 - 8.3 g/dL 7.1    Total Bilirubin 0.2 - 1.2 mg/dL 0.3    GFR >81.19 mL/min 96.20    Total CHOL/HDL Ratio     5  Cholesterol 0 - 200 mg/dL   147  HDL Cholesterol >39.00 mg/dL   82.95 (L)  Direct LDL mg/dL   621.3  MICROALB/CREAT RATIO 0.0 - 30.0 mg/g   1.5  NonHDL     137.35  Triglycerides 0.0 - 149.0 mg/dL   086.5 (H)  VLDL 0.0 - 40.0 mg/dL   78.4 (H)  VITD 69.62 - 100.00 ng/mL   28.48 (L)  Vitamin B12 211 - 911 pg/mL 464    Comment   We received an AFFIRM transport device with a non-specific order. Based upon the specimen submitted, the BV/Vaginitis DNA screen was performed. If this is not what you intended to order, please contact your local client service representative immediately  so that we can adjust our billing appropriately. You may also inquire about alternative or additional testing.      WBC 4.0 - 10.5 K/uL 6.7    RBC 3.87 - 5.11 Mil/uL 4.14    Hemoglobin 12.0 - 15.0 g/dL 95.2    HCT 84.1 - 32.4 % 37.5    MCV 78.0 - 100.0 fl 90.6    MCHC 30.0 - 36.0 g/dL 40.1    RDW  02.7 - 25.3 % 15.0    Platelets 150.0 - 400.0 K/uL 298.0    Neutrophils 43.0 - 77.0 % 57.5    Lymphocytes 12.0 - 46.0 % 29.9    Monocytes Relative 3.0 - 12.0 % 6.2    Eosinophil 0.0 - 5.0 % 4.9    Basophil 0.0 - 3.0 % 1.5    NEUT# 1.4 - 7.7 K/uL 3.8    Lymphocyte # 0.7 - 4.0 K/uL 2.0    Monocyte # 0.1 - 1.0 K/uL 0.4    Eosinophils Absolute 0.0 - 0.7 K/uL 0.3    Basophils Absolute 0.0 - 0.1 K/uL 0.1    Hemoglobin A1C 4.6 - 6.5 % 6.8 (H) 6.8 (H)  TSH 0.35 - 5.50 uIU/mL 1.96    !: Data is abnormal (H): Data is abnormally high (L): Data is abnormally low Rpt: View report in Results Review for more  information

## 2023-02-04 ENCOUNTER — Encounter: Payer: No Typology Code available for payment source | Admitting: Physical Therapy

## 2023-02-05 ENCOUNTER — Other Ambulatory Visit: Payer: Self-pay | Admitting: Family Medicine

## 2023-02-05 ENCOUNTER — Other Ambulatory Visit: Payer: Self-pay | Admitting: Registered Nurse

## 2023-02-05 DIAGNOSIS — N898 Other specified noninflammatory disorders of vagina: Secondary | ICD-10-CM

## 2023-02-06 ENCOUNTER — Other Ambulatory Visit: Payer: Self-pay | Admitting: Family Medicine

## 2023-02-06 NOTE — Telephone Encounter (Signed)
LAST APPOINTMENT DATE: 10/31/22   NEXT APPOINTMENT DATE: 02/09/2023    LAST REFILL:10/31/22  QTY: #13 cap w /0 refills    Vitamin d check non 01/26/23 was 36.86

## 2023-02-08 MED ORDER — VITAMIN D3 50 MCG (2000 UT) PO CAPS: 2000.0000 [IU] | ORAL_CAPSULE | Freq: Every day | ORAL | Status: AC

## 2023-02-08 NOTE — Telephone Encounter (Signed)
Would change to vit D 2000 units per day.  I cancelled the rx.  Recent Vit D wnl.

## 2023-02-09 ENCOUNTER — Ambulatory Visit: Payer: No Typology Code available for payment source | Admitting: Family Medicine

## 2023-02-09 ENCOUNTER — Encounter: Payer: No Typology Code available for payment source | Admitting: Physical Therapy

## 2023-02-09 ENCOUNTER — Encounter: Payer: Self-pay | Admitting: Registered Nurse

## 2023-02-09 ENCOUNTER — Encounter: Payer: Self-pay | Admitting: Family Medicine

## 2023-02-09 VITALS — BP 120/80 | HR 92 | Temp 97.9°F | Ht 66.0 in | Wt 294.0 lb

## 2023-02-09 DIAGNOSIS — E559 Vitamin D deficiency, unspecified: Secondary | ICD-10-CM | POA: Diagnosis not present

## 2023-02-09 DIAGNOSIS — E119 Type 2 diabetes mellitus without complications: Secondary | ICD-10-CM

## 2023-02-09 MED ORDER — METFORMIN HCL 500 MG PO TABS: ORAL_TABLET | ORAL | 3 refills | Status: DC

## 2023-02-09 NOTE — Telephone Encounter (Signed)
Discussed at OV

## 2023-02-09 NOTE — Patient Instructions (Addendum)
You can call for a mammogram at [facility] at [facility].  [address] [place] 336 [phone]  Start metformin, let me know if not tolerated.  Recheck in about 3 months, A1c at the visit.  Take care.  Glad to see you.

## 2023-02-09 NOTE — Progress Notes (Unsigned)
Diabetes:  No meds.   Hypoglycemic episodes: no sx Hyperglycemic episodes: no sx Feet problems: at baseline with episodic pain.  Blood Sugars averaging: not checked.  eye exam within last year: done recently, [facility].  A1c d/w pt at OV.   Diet and exercise d/w pt.   D/w pt about nutrition referral.    Vit D wnl, d/w pt, she can change back to 2000u per day.    Meds, vitals, and allergies reviewed.   ROS: Per HPI unless specifically indicated in ROS section   GEN: nad, alert and oriented HEENT: ncat NECK: supple w/o LA CV: rrr. PULM: ctab, no inc wob ABD: soft, +bs EXT: no edema SKIN: well perfused.  Lesion on the L shin, with prev dermatology eval.    Diabetic foot exam: Normal inspection No skin breakdown No calluses  Normal DP pulses Normal sensation to light touch and monofilament Nails normal

## 2023-02-09 NOTE — Telephone Encounter (Signed)
Patient reported she is not having symptoms and does not need diflucan at this time.  Rx refill refused sent to patient pharmacy.

## 2023-02-11 ENCOUNTER — Encounter: Payer: No Typology Code available for payment source | Admitting: Physical Therapy

## 2023-02-11 NOTE — Assessment & Plan Note (Signed)
A1c d/w pt at OV.   Diet and exercise d/w pt.   D/w pt about nutrition referral.   Start metformin, let me know if not tolerated.  Routine cautions given to patient. Recheck in about 3 months, A1c at the visit.

## 2023-02-11 NOTE — Assessment & Plan Note (Signed)
Vit D wnl, d/w pt, she can change back to 2000u per day.

## 2023-02-12 ENCOUNTER — Encounter: Payer: Self-pay | Admitting: Family Medicine

## 2023-02-16 ENCOUNTER — Encounter: Payer: No Typology Code available for payment source | Admitting: Physical Therapy

## 2023-02-18 ENCOUNTER — Encounter: Payer: No Typology Code available for payment source | Admitting: Physical Therapy

## 2023-03-03 ENCOUNTER — Other Ambulatory Visit: Payer: Self-pay | Admitting: Family Medicine

## 2023-03-31 ENCOUNTER — Other Ambulatory Visit: Payer: Self-pay | Admitting: Family Medicine

## 2023-04-02 ENCOUNTER — Ambulatory Visit: Payer: Self-pay | Admitting: Registered Nurse

## 2023-04-02 ENCOUNTER — Encounter: Payer: Self-pay | Admitting: Registered Nurse

## 2023-04-02 VITALS — BP 153/85 | HR 79 | Temp 97.2°F | Resp 16

## 2023-04-02 DIAGNOSIS — I10 Essential (primary) hypertension: Secondary | ICD-10-CM

## 2023-04-02 DIAGNOSIS — J209 Acute bronchitis, unspecified: Secondary | ICD-10-CM

## 2023-04-02 DIAGNOSIS — B029 Zoster without complications: Secondary | ICD-10-CM

## 2023-04-02 MED ORDER — VALACYCLOVIR HCL 1 G PO TABS: 1000.0000 mg | ORAL_TABLET | Freq: Three times a day (TID) | ORAL | 0 refills | Status: DC

## 2023-04-02 MED ORDER — COVID-19 ANTIGEN TEST VI KIT
1.0000 | PACK | Freq: Every day | 1 refills | Status: DC | PRN
Start: 2023-04-02 — End: 2024-02-15

## 2023-04-02 MED ORDER — PREDNISONE 10 MG PO TABS: ORAL_TABLET | ORAL | Status: AC

## 2023-04-02 MED ORDER — VALACYCLOVIR HCL 1 G PO TABS: 1000.0000 mg | ORAL_TABLET | Freq: Three times a day (TID) | ORAL | 1 refills | Status: AC

## 2023-04-02 MED ORDER — ALBUTEROL SULFATE HFA 108 (90 BASE) MCG/ACT IN AERS: 1.0000 | INHALATION_SPRAY | RESPIRATORY_TRACT | 1 refills | Status: DC | PRN

## 2023-04-02 NOTE — Patient Instructions (Signed)
Shingles  Shingles, or herpes zoster, is an infection. It gives you a skin rash and blisters. These infected areas may hurt a lot. Shingles only happens if: You've had chickenpox. You've been given a shot called a vaccine to protect you from getting chickenpox. Shingles is rare in this case. What are the causes? Shingles is caused by a germ called the varicella-zoster virus. This is the same germ that causes chickenpox. After you're exposed to the germ, it stays in your body but is dormant. This means it isn't active. Shingles happens if the germ becomes active again. This can happen years after you're first exposed to the germ. What increases the risk? You may be more likely to get shingles if: You're older than 60 years of age. You're under a lot of stress. You have a weak immune system. The immune system is your body's defense system. It may be weak if: You have human immunodeficiency virus (HIV). You have acquired immunodeficiency syndrome (AIDS). You have cancer. You take medicines that weaken your immune system. These include organ transplant medicines. What are the signs or symptoms? The first symptoms of shingles may be itching, tingling, or pain. Your skin may feel like it's burning. A few days or weeks later, you'll get a rash. Here's what you can expect: The rash is likely to be on one side of your body. The rash may be shaped like a belt or a band. Over time, it will turn into blisters filled with fluid. The blisters will break open and change into scabs. The scabs will dry up in about 2-3 weeks. You may also have: A fever. Chills. A headache. Nausea. How is this diagnosed? Shingles is diagnosed with a skin exam. A sample called a culture may be taken from one of your blisters and sent to a lab. This will show if you have shingles. How is this treated? The rash may last for several weeks. There's no cure for shingles, but your health care provider may give you medicines.  These medicines may: Help with pain. Help with itching. Help with irritation and swelling. Help you get better sooner. Help to prevent long-term problems. If the rash is on your face, you may need to see an eye doctor or an ear, nose, and throat (ENT) doctor. Follow these instructions at home: Medicines Take your medicines only as told by your provider. Put an anti-itch cream or numbing cream on the rash or blisters as told by your provider. Relieving itching and discomfort  To help with itching: Put cold, wet cloths called cold compresses on the rash or blisters. Take a cool bath. Try adding baking soda or dry oatmeal to the water. Do not bathe in hot water. Use calamine lotion on the rash or blisters. You can get this type of lotion at the store. Blister and rash care Keep your rash covered with a loose bandage. Wear loose clothes that don't rub on your rash. Take care of your rash as told by your provider. Make sure you: Wash your hands with soap and water for at least 20 seconds before and after you change your bandage. If you can't use soap and water, use hand sanitizer. Keep your rash and blisters clean by washing them with mild soap and cool water. Change your bandage. Check your rash every day for signs of infection. Check for: More redness, swelling, or pain. Fluid or blood. Warmth. Pus or a bad smell. Do not scratch your rash. Do not pick at your  blisters. To help you not scratch: Keep your fingernails clean and cut short. Try to wear gloves or mittens when you sleep. General instructions Rest. Wash your hands often with soap and water for at least 20 seconds. If you can't use soap and water, use hand sanitizer. Washing your hands lowers your chance of getting a skin infection. Your infection can cause chickenpox in others. If you have blisters that aren't scabs yet, stay away from: Babies. Pregnant people. Children who have eczema. Older people who have organ  transplants. People who have a long-term, or chronic, illness. Anyone who hasn't had chickenpox before. Anyone who hasn't gotten the chickenpox vaccine. How is this prevented? Vaccines are the best way to prevent you from getting chickenpox or shingles. Talk with your provider about getting these shots. Where to find more information Centers for Disease Control and Prevention (CDC): [employer]er[employer].no Contact a health care provider if: Your pain doesn't get better with medicine. Your pain doesn't get better after the rash heals. You have any signs of infection around the rash. Your rash or blisters get worse. You have a fever or chills. Get help right away if: The rash is on your face or nose. You have pain in your face or by your eye. You lose feeling on one side of your face. You have trouble seeing. You have ear pain or ringing in your ear. This information is not intended to replace advice given to you by your health care provider. Make sure you discuss any questions you have with your health care provider. Document Revised: 09/19/2022 Document Reviewed: 09/19/2022 Elsevier Patient Education  2024 [employer] Acute Bronchitis, Adult  Acute bronchitis is sudden inflammation of the main airways (bronchi) that come off the windpipe (trachea) in the lungs. The swelling causes the airways to get smaller and make more mucus than normal. This can make it hard to breathe and can cause coughing or noisy breathing (wheezing). Acute bronchitis may last several weeks. The cough may last longer. Allergies, asthma, and exposure to smoke may make the condition worse. What are the causes? This condition can be caused by germs and by substances that irritate the lungs, including: Cold and flu viruses. The most common cause of this condition is the virus that causes the common cold. Bacteria. This is less common. Breathing in substances that irritate the lungs, including: Smoke from cigarettes and other  forms of tobacco. Dust and pollen. Fumes from household cleaning products, gases, or burned fuel. Indoor or outdoor air pollution. What increases the risk? The following factors may make you more likely to develop this condition: A weak body's defense system, also called the immune system. A condition that affects your lungs and breathing, such as asthma. What are the signs or symptoms? Common symptoms of this condition include: Coughing. This may bring up clear, yellow, or green mucus from your lungs (sputum). Wheezing. Runny or stuffy nose. Having too much mucus in your lungs (chest congestion). Shortness of breath. Aches and pains, including sore throat or chest. How is this diagnosed? This condition is usually diagnosed based on: Your symptoms and medical history. A physical exam. You may also have other tests, including tests to rule out other conditions, such as pneumonia. These tests include: A test of lung function. Test of a mucus sample to look for the presence of bacteria. Tests to check the oxygen level in your blood. Blood tests. Chest X-ray. How is this treated? Most cases of acute bronchitis clear up over time  without treatment. Your health care provider may recommend: Drinking more fluids to help thin your mucus so it is easier to cough up. Taking inhaled medicine (inhaler) to improve air flow in and out of your lungs. Using a vaporizer or a humidifier. These are machines that add water to the air to help you breathe better. Taking a medicine that thins mucus and clears congestion (expectorant). Taking a medicine that prevents or stops coughing (cough suppressant). It is not common to take an antibiotic medicine for this condition. Follow these instructions at home:  Take over-the-counter and prescription medicines only as told by your health care provider. Use an inhaler, vaporizer, or humidifier as told by your health care provider. Take two teaspoons (10 mL)  of honey at bedtime to lessen coughing at night. Drink enough fluid to keep your urine pale yellow. Do not use any products that contain nicotine or tobacco. These products include cigarettes, chewing tobacco, and vaping devices, such as e-cigarettes. If you need help quitting, ask your health care provider. Get plenty of rest. Return to your normal activities as told by your health care provider. Ask your health care provider what activities are safe for you. Keep all follow-up visits. This is important. How is this prevented? To lower your risk of getting this condition again: Wash your hands often with soap and water for at least 20 seconds. If soap and water are not available, use hand sanitizer. Avoid contact with people who have cold symptoms. Try not to touch your mouth, nose, or eyes with your hands. Avoid breathing in smoke or chemical fumes. Breathing smoke or chemical fumes will make your condition worse. Get the flu shot every year. Contact a health care provider if: Your symptoms do not improve after 2 weeks. You have trouble coughing up the mucus. Your cough keeps you awake at night. You have a fever. Get help right away if you: Cough up blood. Feel pain in your chest. Have severe shortness of breath. Faint or keep feeling like you are going to faint. Have a severe headache. Have a fever or chills that get worse. These symptoms may represent a serious problem that is an emergency. Do not wait to see if the symptoms will go away. Get medical help right away. Call your local emergency services (911 in the [place] Do not drive yourself to the hospital. Summary Acute bronchitis is inflammation of the main airways (bronchi) that come off the windpipe (trachea) in the lungs. The swelling causes the airways to get smaller and make more mucus than normal. Drinking more fluids can help thin your mucus so it is easier to cough up. Take over-the-counter and prescription medicines only  as told by your health care provider. Do not use any products that contain nicotine or tobacco. These products include cigarettes, chewing tobacco, and vaping devices, such as e-cigarettes. If you need help quitting, ask your health care provider. Contact a health care provider if your symptoms do not improve after 2 weeks. This information is not intended to replace advice given to you by your health care provider. Make sure you discuss any questions you have with your health care provider. Document Revised: 11/14/2021 Document Reviewed: 12/05/2020 Elsevier Patient Education  2024 [employer].

## 2023-04-02 NOTE — Progress Notes (Signed)
Subjective:    Patient ID: [name], female    DOB: 21-Feb-1961, 62 y.o.   MRN: [id number]  62y/o caucasian female established patient worsening cough sometimes green clear mucous.  Last prednisone 05/2022 [facility] hasn't home covid tested cough worse tried to refill albuterol [facility] and no refills remaining taking her allergy medication denied sick contacts/fever/chills/n/v/d.  Shingles outbreak right flank would like valacyclovir rx.  Burning pain right flank prior to rash erupting.  Denied discharge.  Has been caring for her parents and feels like she got run down/caused this outbreak.  Denied chest pain/dyspnea/headache/visual changes.     Review of Systems  Constitutional:  Positive for fatigue. Negative for chills, diaphoresis and fever.  HENT:  Positive for postnasal drip. Negative for congestion, rhinorrhea, sinus pressure, sinus pain, sneezing, sore throat, tinnitus, trouble swallowing and voice change.   Eyes:  Negative for photophobia and visual disturbance.  Respiratory:  Positive for cough. Negative for apnea, choking, shortness of breath and stridor.   Cardiovascular:  Negative for chest pain, palpitations and leg swelling.  Gastrointestinal:  Negative for diarrhea, nausea and vomiting.  Genitourinary:  Negative for difficulty urinating.  Musculoskeletal:  Positive for back pain and myalgias. Negative for gait problem, neck pain and neck stiffness.  Skin:  Positive for color change and rash. Negative for pallor and wound.  Neurological:  Negative for dizziness, tremors, seizures, syncope, facial asymmetry, speech difficulty, weakness, light-headedness and headaches.  Hematological:  Negative for adenopathy. Does not bruise/bleed easily.  Psychiatric/Behavioral:  Positive for sleep disturbance. Negative for agitation and confusion.        Objective:   Physical Exam Vitals and nursing note reviewed.  Constitutional:      General: She is awake. She is not in acute  distress.    Appearance: Normal appearance. She is well-developed and well-groomed. She is obese. She is not ill-appearing, toxic-appearing or diaphoretic.  HENT:     Head: Normocephalic and atraumatic.     Jaw: There is normal jaw occlusion.     Salivary Glands: Right salivary gland is not diffusely enlarged. Left salivary gland is not diffusely enlarged.     Right Ear: Hearing and external ear normal. No decreased hearing noted.     Left Ear: Hearing and external ear normal. No decreased hearing noted.     Nose: Nose normal. No signs of injury, nasal tenderness, mucosal edema, congestion or rhinorrhea.     Right Turbinates: Enlarged and swollen. Not pale.     Left Turbinates: Enlarged and swollen. Not pale.     Right Sinus: No maxillary sinus tenderness or frontal sinus tenderness.     Left Sinus: No maxillary sinus tenderness or frontal sinus tenderness.     Mouth/Throat:     Lips: Pink. No lesions.     Mouth: Mucous membranes are moist. No oral lesions or angioedema.     Dentition: No gum lesions.     Tongue: No lesions. Tongue does not deviate from midline.     Palate: No mass and lesions.     Pharynx: Uvula midline. Pharyngeal swelling, posterior oropharyngeal erythema and postnasal drip present. No oropharyngeal exudate or uvula swelling.     Tonsils: No tonsillar exudate.     Comments: Cobblestoning posterior pharynx; bilateral allergic shiners; clear discharge bilateral nasal turbinates edema/erythema Eyes:     General: Lids are normal. Vision grossly intact. Gaze aligned appropriately. Allergic shiner present. No scleral icterus.       Right eye: No discharge.  Left eye: No discharge.     Extraocular Movements: Extraocular movements intact.     Conjunctiva/sclera: Conjunctivae normal.     Pupils: Pupils are equal, round, and reactive to light.  Neck:     Trachea: Trachea and phonation normal.  Cardiovascular:     Rate and Rhythm: Normal rate and regular rhythm.      Pulses: Normal pulses.          Radial pulses are 2+ on the right side and 2+ on the left side.     Heart sounds: Normal heart sounds, S1 normal and S2 normal.  Pulmonary:     Effort: Pulmonary effort is normal. No respiratory distress.     Breath sounds: Normal breath sounds and air entry. No stridor, decreased air movement or transmitted upper airway sounds. No decreased breath sounds, wheezing, rhonchi or rales.     Comments: Spoke full sentences without difficulty; no cough observed in exam room until asked patient to take deep breath during intermittent sp02 monitoring lowest reading 90 at 92% RA initially; after deep breathing and forced cough up to 98% then back down to 95% and stable  after forced cough patient continued to have intermittent nonproductive cough in exam room Chest:     Chest wall: No tenderness.  Abdominal:     General: There is no distension.     Palpations: Abdomen is soft.     Tenderness: There is no guarding.  Musculoskeletal:        General: Tenderness present. No swelling, deformity or signs of injury. Normal range of motion.     Right hand: Normal strength. Normal capillary refill.     Left hand: Normal strength. Normal capillary refill.     Cervical back: Normal range of motion and neck supple. No swelling, edema, deformity, erythema, signs of trauma, lacerations, rigidity, spasms, torticollis, tenderness, bony tenderness or crepitus. No pain with movement. Normal range of motion.     Thoracic back: No swelling, edema, deformity, signs of trauma, lacerations, spasms or tenderness.     Right lower leg: No edema.     Left lower leg: No edema.  Lymphadenopathy:     Head:     Right side of head: No submandibular or preauricular adenopathy.     Left side of head: No submandibular or preauricular adenopathy.     Cervical: No cervical adenopathy.     Right cervical: No superficial cervical adenopathy.    Left cervical: No superficial cervical adenopathy.  Skin:     General: Skin is warm and dry.     Capillary Refill: Capillary refill takes less than 2 seconds.     Coloration: Skin is not ashen, cyanotic, jaundiced, mottled, pale or sallow.     Findings: Erythema and rash present. No abrasion, abscess, acne, bruising, burn, ecchymosis, signs of injury, laceration, lesion, petechiae or wound. Rash is macular, papular and vesicular. Rash is not crusting, nodular, purpuric, pustular, scaling or urticarial.     Nails: There is no clubbing.          Comments: Grouped 10 papules and vesicles right flank following dermatome unilateral only dry mild erythema  Neurological:     General: No focal deficit present.     Mental Status: She is alert and oriented to person, place, and time. Mental status is at baseline.     GCS: GCS eye subscore is 4. GCS verbal subscore is 5. GCS motor subscore is 6.     Cranial Nerves: Cranial nerves 2-12  are intact. No cranial nerve deficit, dysarthria or facial asymmetry.     Motor: Motor function is intact. No weakness, tremor, atrophy, abnormal muscle tone or seizure activity.     Coordination: Coordination is intact. Coordination normal.     Gait: Gait is intact. Gait normal.     Comments: In/out of chair and on/off exam table without difficulty; gait sure and steady in clinic; bilateral hand grasp equal 5/5  Psychiatric:        Attention and Perception: Attention and perception normal.        Mood and Affect: Mood and affect normal.        Speech: Speech normal.        Behavior: Behavior normal. Behavior is cooperative.        Thought Content: Thought content normal.        Cognition and Memory: Cognition and memory normal.        Judgment: Judgment normal.    Home covid test negative discussed repeat test in 48 hours x2 if new or worsening symptoms e.g. fever/chills/body aches/headache/n/v/d/loss of taste/smell, sinus pain and/or sore throat. As new covid variants taking longer to turn positive on home tests.  Notify clinic  staff if test positive at home and stay home do not come into work on day of positive test quarantine at home.  Patient agreed with plan of care and had no further questions at this time.       Assessment & Plan:  A-acute bronchitis, herpes zoster without complication, elevated blood pressure reading with hypertension diagnosis  P-sp02 not at baseline today. Given 1 free home covid test from [place] govt to perform prior to returning to [facility] today.  Electronic Rx sent to her pharmacy of choice for home covid antigen test kits UUD daily prn #4 RF1 discussed insurance covers 4 tests per month but now requires prescription  ran out of albuterol and Rx expired unable to refill for prn use refill albuterol 169mcg/act 1-2 puffs po q4-6h prn wheezing/chest tightness/protracted coughing #1 RF1.  Patient feels like when she needs prednisone taper 10mg  po daily with breakfast 60mg  day 1, 50mg  day 2, 40mg  day 3, 30 mg day 4, 20mg  day 5, 10mg  day 6 #21 RF0 dispensed from [facility] to patient.  Discussed clear green mucous usually viral and antibiotics will nto help viral illness.  OTC cough suppressant per manufacturer instructions e.g. robitussin/nyquil.  Honey 1 tablespoon every 4 hours prn cough natural suprressant.  Avoid dehydration drink water to replace losses with cough.  Exitcare handout bronchitis.  Patient verbalized understanding information/instructions, agreed with plan of care and had no further questions at this time.  Patient last episode 2023.  Refilled valacylovir 1000mg  po TID x 7 days #21 RF1 sent to her pharmacy of choice.  Avoid scratching.   Discussed viral illness tends to flare up when immune system stressed.  Avoid itching. May apply calagel 2% gel BID or alternate with hydrocortisone 1% topical BID wash hands before and after application.  OTC antihistamine of patient choice po daily to help with itching.  calagel thin smear BID prn itching we have in clinic stock if she needs while at work; do  not get in eyes; if worsening with calagel use stop and trial hydrocortisone 1% topical BID small smear affected area.  Avoid hot steam showers.  Apply emollient twice a day e.g. Fragrance free vaseline, aquaphor in clinic stock.  May apply ice/cold compress 5 minutes QID prn itching/swelling.  Shower when patient  finishes work/prior to bedtime.  Avoid harsh/abrasive soaps use fragrance free/sensitive like dove/cetaphil.    Medication as directed. Call or return to clinic as needed if these symptoms worsen or fail to improve as anticipated. Exitcare handout on shingles  Follow up for re-evaluation with RN Chantel 8/19 if no improvement and/or worsening of rash with plan of care or PCM/UC this weekend when clinic closed [facility].   If rash symptoms reoccur start valtrex as soon as patient notices symptoms as I gave one refill on Rx.  Avoid scratching area and then especially scratching other body areas/eye as could spread to eye. Patient verbalized agreement and understanding of treatment plan and had no further questions at this time   Pain from shingles elevated blood pressure most likely related viral illness will continue to monitor on subsequent visit.  Take your medications as prescribed.  Follow up re-evaluation same day with provider if chest pain, worst headache of life, visual changes.

## 2023-04-24 ENCOUNTER — Other Ambulatory Visit: Payer: Self-pay | Admitting: Family Medicine

## 2023-04-24 NOTE — Telephone Encounter (Signed)
Sent. Thanks.   

## 2023-06-09 ENCOUNTER — Encounter: Payer: Self-pay | Admitting: Registered Nurse

## 2023-06-09 ENCOUNTER — Telehealth: Payer: Self-pay | Admitting: Registered Nurse

## 2023-06-09 DIAGNOSIS — J0101 Acute recurrent maxillary sinusitis: Secondary | ICD-10-CM

## 2023-06-09 MED ORDER — PREDNISONE 10 MG PO TABS: ORAL_TABLET | ORAL | 0 refills | Status: AC

## 2023-06-09 MED ORDER — AMOXICILLIN-POT CLAVULANATE 875-125 MG PO TABS: 1.0000 | ORAL_TABLET | Freq: Two times a day (BID) | ORAL | 0 refills | Status: AC

## 2023-06-09 NOTE — Telephone Encounter (Signed)
Patient reported has been battling sinus infection for a week. Home covid test negative.  Last augmentin/prednisone Oct 2023 for sinusitis and Aug 2024 prednisone for bronchitis. Would like appt today.  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  Electronic rx sent to her pharmacy of choice Continue flonase 1 spray each nostril BID, saline 2 sprays each nostril q2h wa prn congestion.  start augmentin 875mg  po BID x 10 days #20 RF0 and prednisone 10mg  taper 30mg  x 2 days; 20mg  x 2 days then 10mg  x 7 day po with breakfast  #17 RF0 Denied personal or family history of ENT cancer.  Shower BID especially prior to bed. No evidence of systemic bacterial infection, non toxic and well hydrated.  I do not see where any further testing or imaging is necessary at this time.   I will suggest supportive care, rest, good hygiene and encourage the patient to take adequate fluids.  The patient is to return to clinic or EMERGENCY ROOM if symptoms worsen or change significantly.  Exitcare handout on sinusitis.  Patient verbalized agreement and understanding of treatment plan and had no further questions at this time.   P2:  Hand washing and cover cough

## 2023-07-01 NOTE — Telephone Encounter (Signed)
Patient reported symptoms resolved with plan of care feeling well denied concerns 06/25/23 A&Ox3 spoke full sentences without difficulty respirations even and unlabored RA gait sure and steady seen in [facility]

## 2023-07-07 ENCOUNTER — Telehealth: Payer: Self-pay | Admitting: Registered Nurse

## 2023-07-07 DIAGNOSIS — U071 COVID-19: Secondary | ICD-10-CM

## 2023-07-07 MED ORDER — MOLNUPIRAVIR EUA 200MG CAPSULE: 4.0000 | ORAL_CAPSULE | Freq: Two times a day (BID) | ORAL | 0 refills | Status: AC

## 2023-07-07 MED ORDER — ONDANSETRON HCL 4 MG PO TABS: 4.0000 mg | ORAL_TABLET | Freq: Three times a day (TID) | ORAL | 0 refills | Status: AC | PRN

## 2023-07-07 MED ORDER — COVID-19 ANTIGEN TEST VI KIT: 1.0000 | PACK | Freq: Every day | 1 refills | Status: DC | PRN

## 2023-07-07 NOTE — Telephone Encounter (Addendum)
Patient reported church friends tested positive and she had cough so tested herself and positive for covid yesterday; stayed home and retested again today positive again home test.  Needs refill of home tests spouse negative on testing or symptoms.  Having some cough taking tessalon pearles 200mg  po TID prn cough not noticing much difference.  Has inhaler albuterol and using prn if protracted coughing 2 puffs and it is helping and does not need refill.  Some congestion/rhinitis/stomach sour but not vomiting except one time and was a lot of mucous.  Denied dyspnea/hemoptysis/dysphagia/dysphasia or feeling like she needs prednisone.  First day of symptoms 07/05/23  07/06/23 day 0 positive covid test; day 5 07/11/23  expected RTW 07/14/23 as off 11/23-11/25/24.  Wear mask and no eating in employee lunch room through 07/15/23.  [employer] Re[employer]acements Tonya and Ayah and supervisor Gerre Pebbles notified excused absence through 07/13/23 today for communicable disease  Patient has fatigue, cough and congestion/head cold stayed home today to work remote  Denied close contacts in previous 48hours at work e.g. no mask greater than 15 minutes within 6 feet face to face contact.  Pt began quarantine at that time. Patient did not develop symptoms of  trouble breathing, chest pain, sore throat, body aches, fever or chills.   5 day quarantine per [facility] recommendations. Day 1 of quarantine was 07/06/2023. Patient to contact clinic staff if vomiting after coughing or unable to tolerate po fluids.  Discussed flu and other viral illnesses circulating in community and some causing GI upset.  Electronic Rx zofran 4mg  sig t1-2 po tid prn n/v #18 RF0 If GI upset I have recommended clear fluids then bland diet.  Avoid dairy/spicy, fried and large portions of meat while having nausea.  If vomiting hold po intake x 1 hour.  Then sips clear fluids like broths, ginger ale, power ade, gatorade, pedialyte may advance to soft/bland if no vomiting x 24 hours  and appetite returned otherwise hydration main focus. Call me at work from home number if symptoms not improved with plan of care  patient to call if high fever, dehydration, marked weakness, fainting, increased abdominal pain, blood in stool or vomit (red or black).     Reviewed possible Covid symptoms including cough, shortness of breath with exertion or at rest, runny nose, congestion, sinus pain/pressure, sore throat, fever/chills, body aches, fatigue, loss of taste/smell, GI symptoms of nausea/vomiting/diarrhea. Also reviewed same day/emergent eval/ER precautions of dizziness/syncope, confusion, blue tint to lips/face, severe shortness of breath/difficulty breathing/wheezing.    Patient to isolate in own room and if possible use only one bathroom if living with others in home.  Wear mask when out of room to help prevent spread to others in household.  Sanitize high touch surfaces with lysol/chlorox/bleach spray or wipes daily as viruses are known to live on surfaces from 24 hours to days.  Patient does want antivirals.  Patient at higher risk for hospitalization due to autoimmune disease, obesity, hypertension, diabetes.  Patient is not up to date on covid vaccines.  Recommend annual booster 60 days after infection resolution.  Patient is on prescription medications or daily medications. If taking medications interaction checker used to verify if any drug interactions.  Patient molnupiravir emergency use handout sent to patient electronically along with covid quarantine exitcare handout both in my chart and to personal email.  Discussed how to take molnupiravir 400mg  take 4 tabs by mouth every 12 hours x 5 days. Electronic Rx sent to her pharmacy of choice.  Not  trying to get pregnant or breastfeeding.   Discussed lemonade can sometimes help with metallic/plastic taste in mouth (side effect medication).  Discussed most common side effects GI upset and bad taste in mouth.  Use birth control/avoid getting  pregnant while on paxlovid and no breastfeeding.  Discussed I recommended not having sex with anyone while sick/testing positive/10 day quarantine as could spread virus to partner.  Discussed with patient I would call again this weekend to follow up symptoms/see if questions/concerns.  Exitcare handouts on covid quarantine/home care sent to patient my chart/email.  FDA handout on molnupiravir sent to patient.  Refill on home covid tests UUD #4 RF1 sent to her pharmacy per patient request as she will be retesting before stopping mask wear at home with spouse.  He is testing negative still at this time.   May use salt water gargles and nasal saline 2 sprays each nostril q2h prn congestion/sore throat.  Research has shown it helps to prevent hospitalizations and decrease discomfort.   May use flonase nasal 1 spray each nostril BID prn rhinitis.  Dayquil and nyquil per manufacturer instructions.    Patient has proair inhaler at home for prn use protracted cough/wheezing/chest tightness.  Has used and reports occasional wheezing with cough.  Discussed albuterol use with patient 1-2 puffs po q4-6 hours prn.   Discussed honey 1 tablespoon every 4 hours is a natural cough suppressant but caution due to her diabetes.  Avoid dehydration and drink water to keep urine pale yellow clear and voiding every 2-4 hours while awake.  Patient alert and oriented x3, spoke full sentences without difficulty.  Some nasal congestion/throat clearing audible during telephone call.  No hoarse voice/wheezing/shortness of breath during 10 minute telephone call.  Discussed with patient can contact NP Inetta Fermo through my chart/[phone] when clinic closed if questions or concerns as clinic closed again until Thursday 0800-5pm RN Shanda Bumps onsite this week and closed again Fri-Mon until Tuesday 0900   Pt verbalized understanding and agreement with plan of care. No further questions/concerns at this time. Pt reminded to contact clinic with  any changes in symptoms or questions/concerns. HR notified patient to work remote/quarantine through Day 5 RTW estimated Day 8 with strict mask wear through Day 10 and no eating in employee lunch room.  Estimated return to work onsite 07/14/2023  Supervisor notified of excused absence. Through 07/13/23

## 2023-07-09 NOTE — Telephone Encounter (Signed)
Patient contacted via telephone stated feeling better than earlier in the week still some cough and decreased appetite.  Discussed will follow up via telephone with her on Tuesday next unless she contacts me sooner with questions or concerns.  Patient dry nonproductive cough audible during 2 minute call.  Patient A&Ox3 spoke full sentences without difficulty.  Nasal congestion audible.  Patient tolerating molnupiravir without difficulty and had no further questions at this time and verbalized understanding information/agreed with plan of care.  Continuing quarantine at home.

## 2023-07-13 NOTE — Telephone Encounter (Signed)
Patient contacted via telephone stated diarrhea started yesterday afternoon with an po intake.  Has heard members at church with covid, flu and diarrhea/stomach upset this week.  She has not left the house since covid diagnosis has been quarantining.  Has not tried imodium.  Urine dark yellow per patient.  Tolerating po intake without difficulty.  Finished molnupiravir.  I have recommended clear fluids and advance to soft as tolerated.  Avoid high added sugar foods, dairy, spicy and fried foods until diarrhea resolves. Avoid dehydration drink noncaffeinated beverages (water, ginger ale, clear soup broth, popsicles, no sugar added gatorade/powerade) to urinate every 2-4 hours pale yellow urine.  It is easy to become dehydrated when having diarrhea along with electrolyte imbalances.  Patient to take  temperature and if less than 100.5 F may take over the counter Imodium per manufacturer's instructions.  Patient given exitcare handouts on diarrhea and foods to relieve diarrhea in my chart. Medications as directed.  Return to the clinic if symptoms persist or worsen; I have alerted the patient to call if high fever, dehydration, marked weakness, fainting, increased abdominal pain, blood in stool or vomit. Patient given work/school excuse note for 48 hours. Message sent to supervisor and HR Ayah and Tonya  Re-evaluation tomorrow via telephone.  Patient A&Ox3 spoke full sentences without difficulty.  Frequent throat clearing audible during 5 minute telephone call.  No audible cough/wheezing/shortness of breath or nasal congestion.   Patient verbalized agreement and understanding of treatment plan and had no further questions at this time. P2: Hand washing and fitness

## 2023-07-14 ENCOUNTER — Encounter: Payer: Self-pay | Admitting: Registered Nurse

## 2023-07-14 ENCOUNTER — Other Ambulatory Visit: Payer: Self-pay | Admitting: Registered Nurse

## 2023-07-14 DIAGNOSIS — J209 Acute bronchitis, unspecified: Secondary | ICD-10-CM

## 2023-07-21 NOTE — Telephone Encounter (Signed)
Patient reported GI symptoms resolved but still having cough using albuterol inhaler twice a day am/hs.  Discussed with patient may increase albuterol use to 2p po q4-6h prn protracted cough/wheezing/chest tightness.  Follow up for re-evaluation if new or worsening symptoms e.g. tan/opaque mucous, bloody mucous, fever/chills/dyspnea.  Patient stated she did not request refill Rx from her pharmacy.  Rx denied reply sent to pharmacy at this time.  Patient agreed with plan of care and had no further questions at this time.

## 2023-09-01 ENCOUNTER — Telehealth: Payer: Self-pay | Admitting: Registered Nurse

## 2023-09-01 ENCOUNTER — Encounter: Payer: Self-pay | Admitting: Registered Nurse

## 2023-09-01 DIAGNOSIS — I10 Essential (primary) hypertension: Secondary | ICD-10-CM

## 2023-09-01 DIAGNOSIS — K219 Gastro-esophageal reflux disease without esophagitis: Secondary | ICD-10-CM

## 2023-09-01 NOTE — Telephone Encounter (Signed)
 Patient requested [facility] fills omeprazole  DR 40mg  po daily #90 RF1 and omeprazole  40mg  DR po daily #90 RF1 from [facility] formulary provider.  Last labs Nov 2023 and Mar 2024 stable repeat in 1 year  Patient given 90 day supply of each medication from [facility] today.  Last filled omeprazole  02/03/2023, and hydrochlorothiazide  stated did not need gemfibrozil  and ibuprofen  today     Latest Reference Range & Units 07/14/22 15:43 10/24/22 07:41  COMPREHENSIVE METABOLIC PANEL   Rpt !    Sodium 135 - 145 mEq/L 140    Potassium 3.5 - 5.1 mEq/L 3.8    Chloride 96 - 112 mEq/L 102    CO2 19 - 32 mEq/L 27    Glucose 70 - 99 mg/dL 843 (H)    BUN 6 - 23 mg/dL 10    Creatinine 9.59 - 1.20 mg/dL 9.34    Calcium 8.4 - 89.4 mg/dL 9.4    Alkaline Phosphatase 39 - 117 U/L 78    Albumin 3.5 - 5.2 g/dL 4.3    AST 0 - 37 U/L 22    ALT 0 - 35 U/L 27    Total Protein 6.0 - 8.3 g/dL 7.1    Total Bilirubin 0.2 - 1.2 mg/dL 0.3    GFR >39.99 mL/min 96.20    Total CHOL/HDL Ratio     5  Cholesterol 0 - 200 mg/dL   823  HDL Cholesterol >39.00 mg/dL   61.19 (L)  Direct LDL mg/dL   892.9  MICROALB/CREAT RATIO 0.0 - 30.0 mg/g   1.5  NonHDL     137.35  Triglycerides 0.0 - 149.0 mg/dL   749.9 (H)  VLDL 0.0 - 40.0 mg/dL   49.9 (H)  VITD 69.99 - 100.00 ng/mL   28.48 (L)  Vitamin B12 211 - 911 pg/mL 464    Comment   We received an AFFIRM transport device with a non-specific order. Based upon the specimen submitted, the BV/Vaginitis DNA screen was performed. If this is not what you intended to order, please contact your local client service representative immediately  so that we can adjust our billing appropriately. You may also inquire about alternative or additional testing.      WBC 4.0 - 10.5 K/uL 6.7    RBC 3.87 - 5.11 Mil/uL 4.14    Hemoglobin 12.0 - 15.0 g/dL 87.3    HCT 63.9 - 53.9 % 37.5    MCV 78.0 - 100.0 fl 90.6    MCHC 30.0 - 36.0 g/dL 66.2    RDW 88.4 - 84.4 % 15.0    Platelets 150.0 - 400.0 K/uL  298.0    Neutrophils 43.0 - 77.0 % 57.5    Lymphocytes 12.0 - 46.0 % 29.9    Monocytes Relative 3.0 - 12.0 % 6.2    Eosinophil 0.0 - 5.0 % 4.9    Basophil 0.0 - 3.0 % 1.5    NEUT# 1.4 - 7.7 K/uL 3.8    Lymphocyte # 0.7 - 4.0 K/uL 2.0    Monocyte # 0.1 - 1.0 K/uL 0.4    Eosinophils Absolute 0.0 - 0.7 K/uL 0.3    Basophils Absolute 0.0 - 0.1 K/uL 0.1    Hemoglobin A1C 4.6 - 6.5 % 6.8 (H) 6.8 (H)  TSH 0.35 - 5.50 uIU/mL 1.96    !: Data is abnormal (H): Data is abnormally high (L): Data is abnormally low Rpt: View report in Results Review for more information

## 2023-09-14 NOTE — Progress Notes (Unsigned)
Cardiology Office Note  Date:  09/15/2023   ID:  Diane, [name] 1961-01-22, MRN [id number]  PCP:  Joaquim Nam, MD   Chief Complaint  Patient presents with   12 month follow up     "Doing well."     HPI:  [name] is a 63 year old woman with history of Morbid obesity Chronic cough, >4 years, bronchospastic Heart catheterization 2002 with Dr. 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 Desk job  Lower extremity neuropathy,  Taking gabapentin 400 in Am, 300 at 2 pm, at bedtime , 600 at night Bad pain  On today's visit reports she is taking Lasix and HCTZ daily Potassium 2 a day No recent BMP Not using compression hose  Prior history of chronic cough, better with cinnamon candy  Atypical chest pain left chest, at rest Able at this time  Lab work reviewed Slow trend up in A1c now 7.1, previously 6.1 Total cholesterol 176 Low vitamin D 28, takes over-the-counter vitamin D 5000 units/day, still running low No recent BMP  EKG personally reviewed by myself on todays visit EKG Interpretation Date/Time:  Tuesday September 15 2023 08:21:16 EST Ventricular Rate:  82 PR Interval:  144 QRS Duration:  80 QT Interval:  342 QTC Calculation: 399 R Axis:   3  Text Interpretation: Normal sinus rhythm Normal ECG When compared with ECG of 30-Apr-2020 07:59, No significant change was found Confirmed by [name] [phone]) on 09/15/2023 8:26:54 AM    Other past medical history reviewed Echo 03/2020  1. Left ventricular ejection fraction, by estimation, is 60 to 65%. The  left ventricle has normal function. The left ventricle has no regional  wall motion abnormalities. Left ventricular diastolic parameters are  consistent with Grade II diastolic  dysfunction  (pseudonormalization).   2. Right ventricular systolic function is normal. The right ventricular  size is normal. There is normal pulmonary artery systolic pressure. The  estimated right ventricular systolic pressure is 32.3 mmHg.   Prior cardiac catheterization 2002 normal coronary arteries   PMH:   has a past medical history of Anxiety, Diabetes mellitus without complication (HCC), History of colon polyps, History of eosinophilia, Hyperlipidemia, Hypertension, Hypothyroidism, Migraine, Neuropathy, Nocturia, Pelvic prolapse, Seasonal allergies, Urgency of urination, Vitamin D deficiency, and Wears glasses.  PSH:    Past Surgical History:  Procedure Laterality Date   ABDOMINAL HYSTERECTOMY  2001   w/  Bladder Suspension   ANTERIOR AND POSTERIOR REPAIR WITH SACROSPINOUS FIXATION N/A 07/24/2014   Procedure: SACROSPINOUS LIGAMENT SUSPENSION ;  Surgeon: Freddy Finner, MD;  Location: [facility];  Service: Gynecology;  Laterality: N/A;   COLONOSCOPY W/ POLYPECTOMY  04-25-2014   LAPAROSCOPIC CHOLECYSTECTOMY  2008   VAGINAL HYSTERECTOMY N/A 07/24/2014   Procedure: RESECTION OF CERVIX ;  Surgeon: Freddy Finner, MD;  Location: [facility];  Service: Gynecology;  Laterality: N/A;    Current Outpatient Medications  Medication Sig Dispense Refill   albuterol (VENTOLIN HFA) 108 (90 Base) MCG/ACT inhaler Inhale 1-2 puffs into the lungs every 4 (four) hours as needed for wheezing or shortness of breath. 34 each 1   ARMOUR THYROID 30 MG tablet TAKE 1 TABLET BY MOUTH EVERY DAY WITH BREAKFAST 90 tablet 3   carvedilol (COREG) 6.25 MG tablet  TAKE 1 TABLET BY MOUTH TWICE DAILY WITH MEALS 180 tablet 3   Cholecalciferol (VITAMIN D3) 50 MCG (2000 UT) capsule Take 1 capsule (2,000 Units total) by mouth daily.     Chromium-Cinnamon (CINNAMON PLUS CHROMIUM) [phone] MCG-MG CAPS      diclofenac Sodium (VOLTAREN) 1 % GEL APPLY 2 GRAMS TOPICALLY FOUR TIMES DAILY AS NEEDED 100 g 2    fluticasone (FLONASE) 50 MCG/ACT nasal spray APRAY 1 SPRAY IN EACH NOSTRIL TWICE DAILY 48 g 3   fluticasone-salmeterol (ADVAIR DISKUS) 250-50 MCG/ACT AEPB Inhale 1 puff into the lungs in the morning and at bedtime. 3 each 3   furosemide (LASIX) 20 MG tablet TAKE 1 TABLET BY MOUTH EVERY DAY 90 tablet 3   gabapentin (NEURONTIN) 100 MG capsule TAKE ONE CAPSULE BY MOUTH TWICE A DAY AS NEEDED 180 capsule 3   gabapentin (NEURONTIN) 300 MG capsule 300mg  by mouth during the day and 600-900mg  at night. 360 capsule 3   gemfibrozil (LOPID) 600 MG tablet Take 1 tablet (600 mg total) by mouth 2 (two) times daily before a meal. 180 tablet 3   Halcinonide 0.1 % CREA APPLY TOPICALLY TO THE AFFECTED AREA TWICE DAILY IF NEEDED 60 g 1   hydrochlorothiazide (HYDRODIURIL) 12.5 MG tablet Take 1 tablet (12.5 mg total) by mouth daily. 90 tablet 3   ibuprofen (ADVIL) 800 MG tablet Take 1 tablet (800 mg total) by mouth daily as needed for fever, headache, mild pain or moderate pain. 90 tablet 3   Lutein 20 MG CAPS Take by mouth.     metFORMIN (GLUCOPHAGE) 500 MG tablet Start with 1 tab a day for 2 weeks.  Then take 1 tab twice a day if tolerated. 180 tablet 3   montelukast (SINGULAIR) 10 MG tablet TAKE 1 TABLET BY MOUTH AT BEDTIME 90 tablet 3   Multiple Vitamin (MULTIVITAMIN) capsule Take 1 capsule by mouth daily.     omeprazole (PRILOSEC) 40 MG capsule Take 1 capsule (40 mg total) by mouth daily. 90 capsule 3   Potassium 99 MG TABS Take 1-2 tablets (99-198 mg total) by mouth daily.     promethazine (PHENERGAN) 6.25 MG/5ML syrup Take 5 mLs (6.25 mg total) by mouth at bedtime as needed for up to 24 days for nausea or vomiting. 120 mL 0   Turmeric Curcumin 500 MG CAPS daily.      vitamin B-12 (CYANOCOBALAMIN) 1000 MCG tablet Take 1 tablet (1,000 mcg total) by mouth daily.     COVID-19 Antigen Test KIT 1 Device by In Vitro route daily as needed. (Patient not taking: Reported on 09/15/2023) 4 kit 1   COVID-19 Antigen Test KIT  Place 1 Device into the nose daily as needed. (Patient not taking: Reported on 09/15/2023) 4 kit 1   No current facility-administered medications for this visit.    Allergies:   Codeine   Social History:  The patient  reports that she quit smoking about 41 years ago. Her smoking use included cigarettes. She started smoking about 42 years ago. She has a 1 pack-year smoking history. She has never used smokeless tobacco. She reports that she does not drink alcohol and does not use drugs.   Family History:   family history includes Breast cancer (age of onset: 26) in her sister; Hyperlipidemia in her mother.    Review of Systems: Review of Systems  Constitutional: Negative.   HENT: Negative.    Respiratory: Negative.    Cardiovascular:  Positive for chest pain and leg swelling.  Gastrointestinal: Negative.   Musculoskeletal: Negative.   Neurological: Negative.   Psychiatric/Behavioral: Negative.    All other systems reviewed and are negative.   PHYSICAL EXAM: VS:  BP 130/86 (BP Location: Left Arm, Patient Position: Sitting, Cuff Size: Large)   Pulse 82   Ht 5\' 7"  (1.702 m)   Wt 290 lb 6 oz (131.7 kg)   SpO2 97%   BMI 45.48 kg/m  , BMI Body mass index is 45.48 kg/m. Constitutional:  oriented to person, place, and time. No distress.  HENT:  Head: Grossly normal Eyes:  no discharge. No scleral icterus.  Neck: No JVD, no carotid bruits  Cardiovascular: Regular rate and rhythm, no murmurs appreciated Pulmonary/Chest: Clear to auscultation bilaterally, no wheezes or rails Abdominal: Soft.  no distension.  no tenderness.  Musculoskeletal: Normal range of motion Neurological:  normal muscle tone. Coordination normal. No atrophy Skin: Skin warm and dry Psychiatric: normal affect, pleasant  Recent Labs: No results found for requested labs within last 365 days.    Lipid Panel Lab Results  Component Value Date   CHOL 176 10/24/2022   HDL 38.80 (L) 10/24/2022   LDLCALC 93  09/27/2020   TRIG 250.0 (H) 10/24/2022      Wt Readings from Last 3 Encounters:  09/15/23 290 lb 6 oz (131.7 kg)  02/09/23 294 lb (133.4 kg)  10/31/22 286 lb (129.7 kg)      ASSESSMENT AND PLAN:  Problem List Items Addressed This Visit       Cardiology Problems   Hyperlipidemia   Essential hypertension - Primary   Relevant Orders   EKG 12-Lead (Completed)     Other   Chest pain of uncertain etiology   Relevant Orders   EKG 12-Lead (Completed)   Edema   Chronic cough Noncardiac in nature, managed by primary care Improved with cinnamon candy Stable at this time  Shortness of breath Normal echocardiogram, normal right heart pressures Recommend walking program, low carbohydrates Could consider GLP-1 medication for weight loss  Morbid obesity We have encouraged continued exercise, careful diet management in an effort to lose weight.  Consider GLP-1  Lower extremity edema Stable with Lasix daily and HCTZ daily On potassium 2 a day We will check lab work today  Hypertension Blood pressure stable on current medication regiment  Lower extremity neuropathy Consider GLP-1 given data suggesting improved symptoms On gabapentin    Signed, Dossie Arbour, M.D., Ph.D. [facility] [place], [place] [phone]

## 2023-09-15 ENCOUNTER — Encounter: Payer: Self-pay | Admitting: Cardiovascular Disease

## 2023-09-15 ENCOUNTER — Ambulatory Visit
Payer: No Typology Code available for payment source | Attending: Cardiovascular Disease | Admitting: Cardiovascular Disease

## 2023-09-15 VITALS — BP 130/86 | HR 82 | Ht 67.0 in | Wt 290.4 lb

## 2023-09-15 DIAGNOSIS — I10 Essential (primary) hypertension: Secondary | ICD-10-CM

## 2023-09-15 DIAGNOSIS — R609 Edema, unspecified: Secondary | ICD-10-CM | POA: Diagnosis not present

## 2023-09-15 DIAGNOSIS — R079 Chest pain, unspecified: Secondary | ICD-10-CM

## 2023-09-15 DIAGNOSIS — Z79899 Other long term (current) drug therapy: Secondary | ICD-10-CM

## 2023-09-15 DIAGNOSIS — E782 Mixed hyperlipidemia: Secondary | ICD-10-CM | POA: Diagnosis not present

## 2023-09-15 NOTE — Patient Instructions (Addendum)
Medication Instructions:  No changes  If you need a refill on your cardiac medications before your next appointment, please call your pharmacy.   Lab work: CMP today  Testing/Procedures: No testing  Follow-Up: At [facility], you and your health needs are our priority.  As part of our continuing mission to provide you with exceptional heart care, we have created designated Provider Care Teams.  These Care Teams include your primary Cardiologist (physician) and Advanced Practice Providers (APPs -  Physician Assistants and Nurse Practitioners) who all work together to provide you with the care you need, when you need it.  You will need a follow up appointment in 12 months  Providers on your designated Care Team:   Nicolasa Ducking, NP Eula Listen, PA-C Cadence Fransico Michael, [place]  COVID-19 Vaccine Information can be found at: [email or web address] For questions related to vaccine distribution or appointments, please email [email or web address] or call [phone].

## 2023-09-16 ENCOUNTER — Telehealth: Payer: Self-pay

## 2023-09-16 LAB — COMPREHENSIVE METABOLIC PANEL
ALT: 33 [IU]/L — ABNORMAL HIGH (ref 0–32)
AST: 31 [IU]/L (ref 0–40)
Albumin: 4.4 g/dL (ref 3.8–4.9)
Alkaline Phosphatase: 84 [IU]/L (ref 44–121)
BUN/Creatinine Ratio: 16 (ref 12–28)
BUN: 12 mg/dL (ref 8–27)
Bilirubin Total: 0.3 mg/dL (ref 0.0–1.2)
CO2: 24 mmol/L (ref 20–29)
Calcium: 9.9 mg/dL (ref 8.7–10.3)
Chloride: 102 mmol/L (ref 96–106)
Creatinine, Ser: 0.73 mg/dL (ref 0.57–1.00)
Globulin, Total: 3 g/dL (ref 1.5–4.5)
Glucose: 164 mg/dL — ABNORMAL HIGH (ref 70–99)
Potassium: 4.7 mmol/L (ref 3.5–5.2)
Sodium: 143 mmol/L (ref 134–144)
Total Protein: 7.4 g/dL (ref 6.0–8.5)
eGFR: 94 mL/min/{1.73_m2} (ref >59)

## 2023-09-16 NOTE — Telephone Encounter (Signed)
Spoke to pt. She has a lab visit 01-29-24 for the CPE.  Dr Mariah Milling mentioned to her about changing Metformin to a GLP-1 for its benefits for neuropathy and other things. He feels its a safer drug class than metformin. Should she make an OV before her CPE in June, or wait and discuss then?

## 2023-09-16 NOTE — Telephone Encounter (Signed)
Per EMR, she has been scheduled for June. We usually put in the orders closer to the visit.    Did she want to get the labs done now?  Thanks.

## 2023-09-16 NOTE — Telephone Encounter (Signed)
Please schedule in the meantime so we can discuss. Thanks.

## 2023-09-16 NOTE — Telephone Encounter (Signed)
Copied from CRM [id number]. Topic: Clinical - Request for Lab/Test Order >> Sep 16, 2023 10:53 AM [name] A wrote: Reason for CRM: Patient would like to request lab work for appointment in June for Annual Physical. Please order labs for patient.

## 2023-09-18 ENCOUNTER — Encounter: Payer: Self-pay | Admitting: Cardiovascular Disease

## 2023-09-25 NOTE — Telephone Encounter (Signed)
 See office note 07/14/23

## 2023-10-29 ENCOUNTER — Other Ambulatory Visit: Payer: Self-pay | Admitting: Family Medicine

## 2023-10-29 NOTE — Telephone Encounter (Signed)
 LOV 02/09/23  Last refill 10/31/22 #360 W/ 3 refills  NOV 02/15/24

## 2023-10-29 NOTE — Telephone Encounter (Signed)
 Sent. Thanks.

## 2023-11-01 ENCOUNTER — Other Ambulatory Visit: Payer: Self-pay | Admitting: Family Medicine

## 2023-11-02 NOTE — Telephone Encounter (Signed)
 Last refill 10/31/22 #180 W/3 refills  LOV 02/09/23  NOV 02/15/23

## 2023-11-17 ENCOUNTER — Ambulatory Visit: Payer: Self-pay | Admitting: Registered Nurse

## 2023-11-17 ENCOUNTER — Encounter: Payer: Self-pay | Admitting: Registered Nurse

## 2023-11-17 VITALS — BP 163/99 | HR 86 | Temp 98.5°F

## 2023-11-17 DIAGNOSIS — K219 Gastro-esophageal reflux disease without esophagitis: Secondary | ICD-10-CM

## 2023-11-17 DIAGNOSIS — Z0000 Encounter for general adult medical examination without abnormal findings: Secondary | ICD-10-CM

## 2023-11-17 DIAGNOSIS — I10 Essential (primary) hypertension: Secondary | ICD-10-CM

## 2023-11-17 DIAGNOSIS — J0101 Acute recurrent maxillary sinusitis: Secondary | ICD-10-CM

## 2023-11-17 MED ORDER — AMOXICILLIN-POT CLAVULANATE 875-125 MG PO TABS: 1.0000 | ORAL_TABLET | Freq: Two times a day (BID) | ORAL | Status: AC

## 2023-11-17 MED ORDER — PREDNISONE 10 MG PO TABS
ORAL_TABLET | ORAL | Status: AC
Start: 2023-11-17 — End: 2023-11-23

## 2023-11-17 NOTE — Progress Notes (Signed)
 C/o left ear pain, expelling green mucus from blowing nose, productive cough with yellow sputum, and headache scale of 4 and mainly pressure under her eyes.

## 2023-11-17 NOTE — Patient Instructions (Signed)
 How to Perform a Sinus Rinse A sinus rinse is a home treatment that is used to rinse your sinuses with a germ-free (sterile) mixture of salt and water (saline solution). Sinuses are air-filled spaces in your skull that are behind the bones of your face and forehead. They open into your nasal cavity. A sinus rinse can help to clear mucus, dirt, dust, or pollen from your nasal cavity. You may do a sinus rinse when you have a cold, a virus, nasal allergy symptoms, a sinus infection, or stuffiness in your nose or sinuses. What are the risks? A sinus rinse is generally safe and effective. However, there are a few risks, which include: A burning sensation in your sinuses. This may happen if you do not make the saline solution as directed. Be sure to follow all directions when making the saline solution. Nasal irritation. Infection. This may be from unclean supplies or from contaminated water. Infection from contaminated water is rare, but possible. Do not do a sinus rinse if you have had ear or nasal surgery, ear infection, or plugged ears, unless recommended by your health care provider. Supplies needed: Saline solution or powder. Distilled or sterile water to mix with saline powder. You may use boiled and cooled tap water. Boil tap water for 5 minutes; cool until it is lukewarm. Use within 24 hours. Do not use regular tap water to mix with the saline solution. Neti pot or nasal rinse bottle. These supplies release the saline solution into your nose and through your sinuses. Neti pots and nasal rinse bottles can be purchased at [employer], a health food store, or online. How to perform a sinus rinse  Wash your hands with soap and water for at least 20 seconds. If soap and water are not available, use hand sanitizer. Wash your device according to the directions that came with the product and then dry it. Use the solution that comes with your product or one that is sold separately in stores.  Follow the mixing directions on the package to mix with sterile or distilled water. Fill the device with the amount of saline solution noted in the device instructions. Stand by a sink and tilt your head sideways over the sink. Place the spout of the device in your upper nostril (the one closer to the ceiling). Gently pour or squeeze the saline solution into your nasal cavity. The liquid should drain out from the lower nostril if you are not too congested. While rinsing, breathe through your open mouth. Gently blow your nose to clear any mucus and rinse solution. Blowing too hard may cause ear pain. Turn your head in the other direction and repeat in your other nostril. Clean and rinse your device with clean water and then air-dry it. Talk with your health care provider or pharmacist if you have questions about how to do a sinus rinse. Summary A sinus rinse is a home treatment that is used to rinse your sinuses with a sterile mixture of salt and water (saline solution). You may do a sinus rinse when you have a cold, a virus, nasal allergy symptoms, a sinus infection, or stuffiness in your nose or sinuses. A sinus rinse is generally safe and effective. Follow all instructions carefully. This information is not intended to replace advice given to you by your health care provider. Make sure you discuss any questions you have with your health care provider. Document Revised: 01/21/2021 Document Reviewed: 01/21/2021 Elsevier Patient Education  2024 [employer] Sinus  Infection, Adult A sinus infection, also called sinusitis, is inflammation of your sinuses. Sinuses are hollow spaces in the bones around your face. Your sinuses are located: Around your eyes. In the middle of your forehead. Behind your nose. In your cheekbones. Mucus normally drains out of your sinuses. When your nasal tissues become inflamed or swollen, mucus can become trapped or blocked. This allows bacteria, viruses, and fungi  to grow, which leads to infection. Most infections of the sinuses are caused by a virus. A sinus infection can develop quickly. It can last for up to 4 weeks (acute) or for more than 12 weeks (chronic). A sinus infection often develops after a cold. What are the causes? This condition is caused by anything that creates swelling in the sinuses or stops mucus from draining. This includes: Allergies. Asthma. Infection from bacteria or viruses. Deformities or blockages in your nose or sinuses. Abnormal growths in the nose (nasal polyps). Pollutants, such as chemicals or irritants in the air. Infection from fungi. This is rare. What increases the risk? You are more likely to develop this condition if you: Have a weak body defense system (immune system). Do a lot of swimming or diving. Overuse nasal sprays. Smoke. What are the signs or symptoms? The main symptoms of this condition are pain and a feeling of pressure around the affected sinuses. Other symptoms include: Stuffy nose or congestion that makes it difficult to breathe through your nose. Thick yellow or greenish drainage from your nose. Tenderness, swelling, and warmth over the affected sinuses. A cough that may get worse at night. Decreased sense of smell and taste. Extra mucus that collects in the throat or the back of the nose (postnasal drip) causing a sore throat or bad breath. Tiredness (fatigue). Fever. How is this diagnosed? This condition is diagnosed based on: Your symptoms. Your medical history. A physical exam. Tests to find out if your condition is acute or chronic. This may include: Checking your nose for nasal polyps. Viewing your sinuses using a device that has a light (endoscope). Testing for allergies or bacteria. Imaging tests, such as an MRI or CT scan. In rare cases, a bone biopsy may be done to rule out more serious types of fungal sinus disease. How is this treated? Treatment for a sinus infection  depends on the cause and whether your condition is chronic or acute. If caused by a virus, your symptoms should go away on their own within 10 days. You may be given medicines to relieve symptoms. They include: Medicines that shrink swollen nasal passages (decongestants). A spray that eases inflammation of the nostrils (topical intranasal corticosteroids). Rinses that help get rid of thick mucus in your nose (nasal saline washes). Medicines that treat allergies (antihistamines). Over-the-counter pain relievers. If caused by bacteria, your health care provider may recommend waiting to see if your symptoms improve. Most bacterial infections will get better without antibiotic medicine. You may be given antibiotics if you have: A severe infection. A weak immune system. If caused by narrow nasal passages or nasal polyps, surgery may be needed. Follow these instructions at home: Medicines Take, use, or apply over-the-counter and prescription medicines only as told by your health care provider. These may include nasal sprays. If you were prescribed an antibiotic medicine, take it as told by your health care provider. Do not stop taking the antibiotic even if you start to feel better. Hydrate and humidify  Drink enough fluid to keep your urine pale yellow. Staying hydrated will help  to thin your mucus. Use a cool mist humidifier to keep the humidity level in your home above 50%. Inhale steam for 10-15 minutes, 3-4 times a day, or as told by your health care provider. You can do this in the bathroom while a hot shower is running. Limit your exposure to cool or dry air. Rest Rest as much as possible. Sleep with your head raised (elevated). Make sure you get enough sleep each night. General instructions  Apply a warm, moist washcloth to your face 3-4 times a day or as told by your health care provider. This will help with discomfort. Use nasal saline washes as often as told by your health care  provider. Wash your hands often with soap and water to reduce your exposure to germs. If soap and water are not available, use hand sanitizer. Do not smoke. Avoid being around people who are smoking (secondhand smoke). Keep all follow-up visits. This is important. Contact a health care provider if: You have a fever. Your symptoms get worse. Your symptoms do not improve within 10 days. Get help right away if: You have a severe headache. You have persistent vomiting. You have severe pain or swelling around your face or eyes. You have vision problems. You develop confusion. Your neck is stiff. You have trouble breathing. These symptoms may be an emergency. Get help right away. Call 911. Do not wait to see if the symptoms will go away. Do not drive yourself to the hospital. Summary A sinus infection is soreness and inflammation of your sinuses. Sinuses are hollow spaces in the bones around your face. This condition is caused by nasal tissues that become inflamed or swollen. The swelling traps or blocks the flow of mucus. This allows bacteria, viruses, and fungi to grow, which leads to infection. If you were prescribed an antibiotic medicine, take it as told by your health care provider. Do not stop taking the antibiotic even if you start to feel better. Keep all follow-up visits. This is important. This information is not intended to replace advice given to you by your health care provider. Make sure you discuss any questions you have with your health care provider. Document Revised: 07/09/2021 Document Reviewed: 07/09/2021 Elsevier Patient Education  2024 [employer].

## 2023-11-17 NOTE — Progress Notes (Signed)
 Subjective:    Patient ID: [name], female    DOB: Aug 17, 1961, 63 y.o.   MRN: [id number]  63y/o married established caucasian female here for chunky yellow nasal discharge and green mucous with cough.  Feels like her usual spring sinus infection requirements augmentin/prednisone.  Has been using flonase/nasal saline.  Headache/sore throat/ears clogged up  Patient also due for refill [facility] omeprazole and hydrochlorothiazide  Working well for her denied concerns.  Last filled in 01 September 2023 90 tabs each.       Review of Systems  Constitutional:  Positive for fatigue. Negative for chills, diaphoresis and fever.  HENT:  Positive for congestion, hearing loss, postnasal drip, rhinorrhea, sinus pressure, sinus pain, sore throat and tinnitus. Negative for dental problem, drooling, ear discharge, ear pain, facial swelling, mouth sores, nosebleeds, trouble swallowing and voice change.   Eyes:  Negative for photophobia and visual disturbance.  Respiratory:  Positive for cough. Negative for choking, chest tightness, shortness of breath, wheezing and stridor.   Cardiovascular:  Negative for chest pain and palpitations.  Gastrointestinal:  Negative for diarrhea, nausea and vomiting.  Genitourinary:  Negative for difficulty urinating.  Musculoskeletal:  Negative for back pain, gait problem, neck pain and neck stiffness.  Skin:  Negative for rash.  Allergic/Immunologic: Positive for environmental allergies.  Neurological:  Positive for headaches. Negative for dizziness, tremors, seizures, syncope, facial asymmetry, speech difficulty, weakness and light-headedness.  Hematological:  Negative for adenopathy. Does not bruise/bleed easily.  Psychiatric/Behavioral:  Positive for sleep disturbance. Negative for agitation and confusion.        Objective:   Physical Exam Vitals and nursing note reviewed.  Constitutional:      General: She is awake. She is not in acute distress.    Appearance:  Normal appearance. She is well-developed and well-groomed. She is not ill-appearing, toxic-appearing or diaphoretic.  HENT:     Head: Normocephalic and atraumatic.     Jaw: There is normal jaw occlusion. No trismus.     Salivary Glands: Right salivary gland is not diffusely enlarged or tender. Left salivary gland is not diffusely enlarged or tender.     Right Ear: Hearing, ear canal and external ear normal. No decreased hearing noted. No laceration, drainage, swelling or tenderness. A middle ear effusion is present. There is no impacted cerumen. No foreign body. No mastoid tenderness. No PE tube. No hemotympanum. Tympanic membrane is not injected, scarred, perforated, erythematous, retracted or bulging.     Left Ear: Hearing, ear canal and external ear normal. No decreased hearing noted. No laceration, drainage, swelling or tenderness. A middle ear effusion is present. There is no impacted cerumen. No foreign body. No mastoid tenderness. No PE tube. No hemotympanum. Tympanic membrane is not injected, scarred, perforated, erythematous, retracted or bulging.     Ears:     Comments: Bilateral TMs intact air fluid level clear; no debris bilateral auditory canals    Nose: Nasal tenderness, mucosal edema, congestion and rhinorrhea present. No nasal deformity, septal deviation or laceration. Rhinorrhea is clear.     Right Nostril: No epistaxis.     Left Nostril: No epistaxis.     Right Turbinates: Enlarged and swollen. Not pale.     Left Turbinates: Enlarged and swollen. Not pale.     Right Sinus: Maxillary sinus tenderness and frontal sinus tenderness present.     Left Sinus: Maxillary sinus tenderness and frontal sinus tenderness present.     Comments: Maxillary greater than frontal sinuses bilaterally TTP; clear  discharge yellow bilateral nasal turbinates edema/erythema; bilateral allergic shiners; lower eyelids 1+/4 nonpitting edema; cobblestoning posterior pharynx; nasal sniffing and throat  clearing/congestion raspy voice in exam room observed    Mouth/Throat:     Lips: Pink. No lesions.     Mouth: Mucous membranes are moist. Mucous membranes are not pale, not dry and not cyanotic. No lacerations, oral lesions or angioedema.     Dentition: No dental abscesses or gum lesions.     Tongue: No lesions. Tongue does not deviate from midline.     Palate: No mass and lesions.     Pharynx: Uvula midline. Pharyngeal swelling, posterior oropharyngeal erythema and postnasal drip present. No oropharyngeal exudate or uvula swelling.     Tonsils: No tonsillar exudate or tonsillar abscesses.  Eyes:     General: Lids are normal. Vision grossly intact. Gaze aligned appropriately. Allergic shiner present. No scleral icterus.       Right eye: No foreign body, discharge or hordeolum.        Left eye: No foreign body, discharge or hordeolum.     Extraocular Movements: Extraocular movements intact.     Right eye: Normal extraocular motion and no nystagmus.     Left eye: Normal extraocular motion and no nystagmus.     Conjunctiva/sclera: Conjunctivae normal.     Right eye: Right conjunctiva is not injected. No chemosis, exudate or hemorrhage.    Left eye: Left conjunctiva is not injected. No chemosis, exudate or hemorrhage.    Pupils: Pupils are equal, round, and reactive to light. Pupils are equal.     Right eye: Pupil is round and reactive.     Left eye: Pupil is round and reactive.  Neck:     Thyroid: No thyroid mass or thyromegaly.     Trachea: Trachea and phonation normal. No tracheal tenderness or tracheal deviation.  Cardiovascular:     Rate and Rhythm: Normal rate and regular rhythm.     Pulses: Normal pulses.          Radial pulses are 2+ on the right side and 2+ on the left side.     Heart sounds: Normal heart sounds, S1 normal and S2 normal.  Pulmonary:     Effort: Pulmonary effort is normal. No accessory muscle usage or respiratory distress.     Breath sounds: Normal breath sounds.  No stridor. No decreased breath sounds, wheezing, rhonchi or rales.     Comments: Spoke full sentences without difficulty; no cough observed in clinic; throat clearing observed and nasal congestion/sniffing Chest:     Chest wall: No tenderness.  Abdominal:     General: There is no distension.     Palpations: Abdomen is soft.  Musculoskeletal:        General: No tenderness. Normal range of motion.     Right hand: Normal strength. Normal capillary refill.     Left hand: Normal strength. Normal capillary refill.     Cervical back: Normal range of motion and neck supple. No edema, erythema, signs of trauma, rigidity, torticollis, tenderness or crepitus. No pain with movement. Normal range of motion.     Right hip: Normal.     Left hip: Normal.     Right knee: Normal.     Left knee: Normal.  Lymphadenopathy:     Head:     Right side of head: No submental, submandibular, tonsillar, preauricular, posterior auricular or occipital adenopathy.     Left side of head: No submental, submandibular, tonsillar, preauricular, posterior  auricular or occipital adenopathy.     Cervical: No cervical adenopathy.     Right cervical: No superficial, deep or posterior cervical adenopathy.    Left cervical: No superficial, deep or posterior cervical adenopathy.  Skin:    General: Skin is warm and dry.     Capillary Refill: Capillary refill takes less than 2 seconds.     Coloration: Skin is not ashen, cyanotic, jaundiced, mottled, pale or sallow.     Findings: No abrasion, abscess, acne, bruising, burn, ecchymosis, erythema, signs of injury, laceration, lesion, petechiae, rash or wound.     Nails: There is no clubbing.     Comments: Face/neck/hands visually evaluated  Neurological:     General: No focal deficit present.     Mental Status: She is alert and oriented to person, place, and time. Mental status is at baseline. She is not disoriented.     GCS: GCS eye subscore is 4. GCS verbal subscore is 5. GCS  motor subscore is 6.     Cranial Nerves: Cranial nerves 2-12 are intact. No cranial nerve deficit, dysarthria or facial asymmetry.     Sensory: No sensory deficit.     Motor: Motor function is intact. No weakness, tremor, atrophy, abnormal muscle tone or seizure activity.     Coordination: Coordination is intact. Coordination normal.     Gait: Gait is intact. Gait normal.     Comments: In/out of chair without difficulty; gait sure and steady in clinic; bilateral hand grasp equal 5/5  Psychiatric:        Attention and Perception: Attention and perception normal.        Mood and Affect: Mood and affect normal.        Speech: Speech normal.        Behavior: Behavior normal. Behavior is cooperative.        Thought Content: Thought content normal.        Cognition and Memory: Cognition and memory normal.        Judgment: Judgment normal.    Latest Reference Range & Units 09/15/23 08:56  COMPREHENSIVE METABOLIC PANEL WITH GFR  Rpt !  Sodium 134 - 144 mmol/L 143  Potassium 3.5 - 5.2 mmol/L 4.7  Chloride 96 - 106 mmol/L 102  CO2 20 - 29 mmol/L 24  Glucose 70 - 99 mg/dL 409 (H)  BUN 8 - 27 mg/dL 12  Creatinine 8.11 - 9.14 mg/dL 7.82  Calcium 8.7 - 95.6 mg/dL 9.9  BUN/Creatinine Ratio 12 - 28  16  eGFR >59 mL/min/1.73 94  Alkaline Phosphatase 44 - 121 IU/L 84  Albumin 3.8 - 4.9 g/dL 4.4  AST 0 - 40 IU/L 31  ALT 0 - 32 IU/L 33 (H)  Total Protein 6.0 - 8.5 g/dL 7.4  Total Bilirubin 0.0 - 1.2 mg/dL 0.3  !: Data is abnormal (H): Data is abnormally high Rpt: View report in Results Review for more information       Assessment & Plan:   A-acute recurrent maxillary sinusitis, essential hypertension, GERD, preventative health care  P-continue flonase 1 spray each nostril BID, saline 2 sprays each nostril q2h wa prn congestion.  start augmentin 875mg  po BID x 10 days #20 RF0 and prednisone 10mg  taper with breakfast (60/50/40/30/20/10x1 day each) #21 RF0 dispensed from [facility] to patient  Denied  personal or family history of ENT cancer.  Shower BID especially prior to bed. No evidence of systemic bacterial infection, non toxic and well hydrated.  I do not see  where any further testing or imaging is necessary at this time.   I will suggest supportive care, rest, good hygiene and encourage the patient to take adequate fluids.  The patient is to return to clinic or EMERGENCY ROOM if symptoms worsen or change significantly.  Exitcare handouts on sinusitis and sinus rinse.  Patient verbalized agreement and understanding of treatment plan and had no further questions at this time.   P2:  Hand washing and cover cough   Patient requested [facility] fills omeprazole DR 40mg  po daily #90 RF1 and omeprazole 40mg  DR po daily #90 RF1 from [facility] formulary provider.  Last labs CMET Jan 2025 and lipids/A1c Nov 2023 and Mar 2024 stable patient to schedule lipid and A1c with RN Olegario Messier for Be Well 2026 fasting. Patient given 90 day supply of each medication from [facility] today.  Sick today BP elevated previous Jan 2025 with cardiology 130/86   Will repeat BP when patient not sick and off prednisone a couple weeks.  Omeprazole working for GERD.  Last magnesium level none on file ordered for patient due to chronic PPI use.  Patient verbalized understanding information/instructions, agreed with plan of care and had no further questions at this time.  Be Well 2026 due prior to 17 Mar 2024.  Bp did not meet goal Jan or today office visit.  Repeat in 1 month due to prednisone use.  Fasting lipids and Hgba1c ordered for patient and instructed to schedule with RN Olegario Messier and sign Be Well forms.  Patient verbalized understanding information/instructions, agreed with plan of care and had no further questions at this time.

## 2023-11-17 NOTE — Progress Notes (Deleted)
   Subjective:    Patient ID: [name], female    DOB: 1961-04-24, 63 y.o.   MRN: [id number]  HPI    Review of Systems     Objective:   Physical Exam   Last filled 09/01/23 per chart review 90 day supply hydrochlorothiazide 12.5mg  po daily and omeprazole DR 40mg  po daily     Assessment & Plan:

## 2023-11-18 ENCOUNTER — Ambulatory Visit (INDEPENDENT_AMBULATORY_CARE_PROVIDER_SITE_OTHER)

## 2023-11-18 ENCOUNTER — Encounter: Payer: Self-pay | Admitting: Podiatry

## 2023-11-18 ENCOUNTER — Ambulatory Visit: Admitting: Podiatry

## 2023-11-18 DIAGNOSIS — M7750 Other enthesopathy of unspecified foot: Secondary | ICD-10-CM | POA: Diagnosis not present

## 2023-11-18 DIAGNOSIS — M7752 Other enthesopathy of left foot: Secondary | ICD-10-CM

## 2023-11-18 DIAGNOSIS — E1142 Type 2 diabetes mellitus with diabetic polyneuropathy: Secondary | ICD-10-CM

## 2023-11-18 DIAGNOSIS — M7751 Other enthesopathy of right foot: Secondary | ICD-10-CM

## 2023-11-18 NOTE — Progress Notes (Signed)
 Subjective:  Patient ID: [name], female    DOB: 1960/08/23,  MRN: [id number] HPI Chief Complaint  Patient presents with   Foot Pain    Neuropathy bilateral - toes/forefoot - takes gabapentin - 500mg  AM, 600mg  PM, 900mg  at bedtime-not helping, also uses lidocaine cream throughout the day, arch pain left - recent pain in the last few weeks   New Patient (Initial Visit)    63 y.o. female presents with the above complaint.   ROS: Denies fever chills nausea vomiting muscle aches pains calf pain back pain chest pain shortness of breath.  Past Medical History:  Diagnosis Date   Anxiety    Diabetes mellitus without complication (HCC)    History of colon polyps    History of eosinophilia    ESOPHAGEAL   Hyperlipidemia    Hypertension    Hypothyroidism    Migraine    Neuropathy    Nocturia    Pelvic prolapse    Seasonal allergies    Urgency of urination    Vitamin D deficiency    Wears glasses    Past Surgical History:  Procedure Laterality Date   ABDOMINAL HYSTERECTOMY  2001   w/  Bladder Suspension   ANTERIOR AND POSTERIOR REPAIR WITH SACROSPINOUS FIXATION N/A 07/24/2014   Procedure: SACROSPINOUS LIGAMENT SUSPENSION ;  Surgeon: Freddy Finner, MD;  Location: [facility];  Service: Gynecology;  Laterality: N/A;   COLONOSCOPY W/ POLYPECTOMY  04-25-2014   LAPAROSCOPIC CHOLECYSTECTOMY  2008   VAGINAL HYSTERECTOMY N/A 07/24/2014   Procedure: RESECTION OF CERVIX ;  Surgeon: Freddy Finner, MD;  Location: [facility];  Service: Gynecology;  Laterality: N/A;    Current Outpatient Medications:    albuterol (VENTOLIN HFA) 108 (90 Base) MCG/ACT inhaler, Inhale 1-2 puffs into the lungs every 4 (four) hours as needed for wheezing or shortness of breath., Disp: 34 each, Rfl: 1   amoxicillin-clavulanate (AUGMENTIN) 875-125 MG tablet, Take 1 tablet by mouth every 12 (twelve) hours for 10 days., Disp: , Rfl:    ARMOUR THYROID 30 MG tablet, TAKE 1 TABLET BY  MOUTH EVERY DAY WITH BREAKFAST, Disp: 90 tablet, Rfl: 3   BINAXNOW COVID-19 AG HOME TEST KIT, Place 1 each into the nose See admin instructions., Disp: , Rfl:    carvedilol (COREG) 6.25 MG tablet, TAKE 1 TABLET BY MOUTH TWICE DAILY WITH MEALS, Disp: 180 tablet, Rfl: 3   Cholecalciferol (VITAMIN D3) 50 MCG (2000 UT) capsule, Take 1 capsule (2,000 Units total) by mouth daily., Disp: , Rfl:    Chromium-Cinnamon (CINNAMON PLUS CHROMIUM) [phone] MCG-MG CAPS, , Disp: , Rfl:    COVID-19 Antigen Test KIT, 1 Device by In Vitro route daily as needed. (Patient not taking: Reported on 09/15/2023), Disp: 4 kit, Rfl: 1   COVID-19 Antigen Test KIT, Place 1 Device into the nose daily as needed. (Patient not taking: Reported on 09/15/2023), Disp: 4 kit, Rfl: 1   diclofenac Sodium (VOLTAREN) 1 % GEL, APPLY 2 GRAMS TOPICALLY FOUR TIMES DAILY AS NEEDED, Disp: 100 g, Rfl: 2   fluticasone (FLONASE) 50 MCG/ACT nasal spray, APRAY 1 SPRAY IN EACH NOSTRIL TWICE DAILY, Disp: 48 g, Rfl: 3   fluticasone-salmeterol (ADVAIR DISKUS) 250-50 MCG/ACT AEPB, Inhale 1 puff into the lungs in the morning and at bedtime., Disp: 3 each, Rfl: 3   furosemide (LASIX) 20 MG tablet, TAKE 1 TABLET BY MOUTH EVERY DAY, Disp: 90 tablet, Rfl: 3   gabapentin (NEURONTIN) 100 MG capsule, TAKE 1  CAPSULE BY MOUTH TWICE DAILY AS NEEDED, Disp: 180 capsule, Rfl: 3   gabapentin (NEURONTIN) 300 MG capsule, TAKE ONE CAPSULE BY MOUTH EVERY DAY AND 2-3 CAPSULES AT NIGHT AS DIRECTED, Disp: 360 capsule, Rfl: 3   gemfibrozil (LOPID) 600 MG tablet, Take 1 tablet (600 mg total) by mouth 2 (two) times daily before a meal., Disp: 180 tablet, Rfl: 3   Halcinonide 0.1 % CREA, APPLY TOPICALLY TO THE AFFECTED AREA TWICE DAILY IF NEEDED, Disp: 60 g, Rfl: 1   hydrochlorothiazide (HYDRODIURIL) 12.5 MG tablet, Take 1 tablet (12.5 mg total) by mouth daily., Disp: 90 tablet, Rfl: 3   ibuprofen (ADVIL) 800 MG tablet, Take 1 tablet (800 mg total) by mouth daily as needed for fever,  headache, mild pain or moderate pain., Disp: 90 tablet, Rfl: 3   Lutein 20 MG CAPS, Take by mouth., Disp: , Rfl:    metFORMIN (GLUCOPHAGE) 500 MG tablet, Start with 1 tab a day for 2 weeks.  Then take 1 tab twice a day if tolerated., Disp: 180 tablet, Rfl: 3   montelukast (SINGULAIR) 10 MG tablet, TAKE 1 TABLET BY MOUTH AT BEDTIME, Disp: 90 tablet, Rfl: 3   Multiple Vitamin (MULTIVITAMIN) capsule, Take 1 capsule by mouth daily., Disp: , Rfl:    omeprazole (PRILOSEC) 40 MG capsule, Take 1 capsule (40 mg total) by mouth daily., Disp: 90 capsule, Rfl: 3   Potassium 99 MG TABS, Take 1-2 tablets (99-198 mg total) by mouth daily., Disp: , Rfl:    predniSONE (DELTASONE) 10 MG tablet, Take 6 tablets (60 mg total) by mouth daily with breakfast for 1 day, THEN 5 tablets (50 mg total) daily with breakfast for 1 day, THEN 4 tablets (40 mg total) daily with breakfast for 1 day, THEN 3 tablets (30 mg total) daily with breakfast for 1 day, THEN 2 tablets (20 mg total) daily with breakfast for 1 day, THEN 1 tablet (10 mg total) daily with breakfast for 1 day., Disp: , Rfl:    promethazine (PHENERGAN) 6.25 MG/5ML syrup, Take 5 mLs (6.25 mg total) by mouth at bedtime as needed for up to 24 days for nausea or vomiting., Disp: 120 mL, Rfl: 0   Turmeric Curcumin 500 MG CAPS, daily. , Disp: , Rfl:    vitamin B-12 (CYANOCOBALAMIN) 1000 MCG tablet, Take 1 tablet (1,000 mcg total) by mouth daily., Disp: , Rfl:   Allergies  Allergen Reactions   Codeine Nausea And Vomiting   Review of Systems Objective:  There were no vitals filed for this visit.  General: Well developed, nourished, in no acute distress, alert and oriented x3   Dermatological: Skin is warm, dry and supple bilateral. Nails x 10 are well maintained; remaining integument appears unremarkable at this time. There are no open sores, no preulcerative lesions, no rash or signs of infection present.  Dry scaly skin fissures to the posterior inferior aspect of the  bilateral heels  Vascular: Dorsalis Pedis artery and Posterior Tibial artery pedal pulses are 2/4 bilateral with immedate capillary fill time. Pedal hair growth present. No varicosities and no lower extremity edema present bilateral.   Neruologic: Grossly intact via light touch bilateral. Vibratory intact via tuning fork bilateral. Protective threshold with Semmes Wienstein monofilament absent to all pedal sites bilateral. Patellar and Achilles deep tendon reflexes 2+ bilateral. No Babinski or clonus noted bilateral.   Musculoskeletal: No gross boney pedal deformities bilateral. No pain, crepitus, or limitation noted with foot and ankle range of motion bilateral. Muscular strength  5/5 in all groups tested bilateral.  No reproducible pain on palpation.  Gait: Unassisted, Nonantalgic.    Radiographs:  Radiographs taken today demonstrate an osseously mature individual with plantar posterior calcaneal heel spurs.  Slight increase in density at the plantar fascial calcaneal insertion site.  He also has some dorsal spurring or lipping at the head of the first metatarsal at the metatarsal phalangeal joint.  This is consistent with early osteoarthritic change.  Demineralized bone visible.  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  Plan: Discussed etiology pathology conservative versus surgical therapies discussed appropriate shoe gear stretching exercises ice therapy and shoe gear modifications.  She will continue the dosages of gabapentin set forth by her primary care provider.  At this point I feel is necessary to send her to neurology since she has not been for a neuropathic workup.  I will follow-up with her in the near future.     Hanna Ra T. [place], [place]

## 2023-12-17 ENCOUNTER — Encounter: Payer: Self-pay | Admitting: Family Medicine

## 2023-12-17 ENCOUNTER — Telehealth: Payer: Self-pay | Admitting: Registered Nurse

## 2023-12-17 DIAGNOSIS — E785 Hyperlipidemia, unspecified: Secondary | ICD-10-CM

## 2023-12-17 DIAGNOSIS — Z0000 Encounter for general adult medical examination without abnormal findings: Secondary | ICD-10-CM

## 2023-12-17 MED ORDER — GEMFIBROZIL 600 MG PO TABS: 600.0000 mg | ORAL_TABLET | Freq: Two times a day (BID) | ORAL | Status: DC

## 2023-12-18 ENCOUNTER — Encounter: Payer: Self-pay | Admitting: Registered Nurse

## 2023-12-18 NOTE — Telephone Encounter (Signed)
 Patient reported she is still taking gemfibrozil  noted had not filled from [facility] since Jun 2024.  Reminded patient free no copay at [facility].  180 capsules dispensed to patient today gemfibrozil  600mg  po BID take with food.  Labs due patient stated she has appt scheduled with [facility] for June and will have Be Well labs completed at that time.  Executive panel and Hgba1c fasting ordered for patient.  Patient to see RN Thersia Flax to obtain Be Well ROI and sign tobacco attestation.  She is to bring Be Well ROI 2026 with her to [facility] appt for signature.  Patient agreed with plan of care and had no further questions at that time.   Latest Reference Range & Units 10/24/22 07:41 01/26/23 07:29 09/15/23 08:56  COMPREHENSIVE METABOLIC PANEL WITH GFR    Rpt !  Sodium 134 - 144 mmol/L   143  Potassium 3.5 - 5.2 mmol/L   4.7  Chloride 96 - 106 mmol/L   102  CO2 20 - 29 mmol/L   24  Glucose 70 - 99 mg/dL   130 (H)  BUN 8 - 27 mg/dL   12  Creatinine 8.65 - 1.00 mg/dL   7.84  Calcium 8.7 - 69.6 mg/dL   9.9  BUN/Creatinine Ratio 12 - 28    16  eGFR >59 mL/min/1.73   94  Alkaline Phosphatase 44 - 121 IU/L   84  Albumin 3.8 - 4.9 g/dL   4.4  AST 0 - 40 IU/L   31  ALT 0 - 32 IU/L   33 (H)  Total Protein 6.0 - 8.5 g/dL   7.4  Total Bilirubin 0.0 - 1.2 mg/dL   0.3  Total CHOL/HDL Ratio  5    Cholesterol 0 - 200 mg/dL 295    HDL Cholesterol >39.00 mg/dL 28.41 (L)    Direct LDL mg/dL 324.4    MICROALB/CREAT RATIO 0.0 - 30.0 mg/g 1.5    NonHDL  137.35    Triglycerides 0.0 - 149.0 mg/dL 010.2 (H)    VLDL 0.0 - 40.0 mg/dL 72.5 (H)    VITD 36.64 - 100.00 ng/mL 28.48 (L) 36.86   Globulin, Total 1.5 - 4.5 g/dL   3.0  Hemoglobin Q0H 4.6 - 6.5 % 6.8 (H) 7.1 (H)   !: Data is abnormal (H): Data is abnormally high (L): Data is abnormally low Rpt: View report in Results Review for more information

## 2024-01-03 ENCOUNTER — Other Ambulatory Visit: Payer: Self-pay | Admitting: Family Medicine

## 2024-01-07 ENCOUNTER — Other Ambulatory Visit: Payer: Self-pay | Admitting: Family Medicine

## 2024-01-07 DIAGNOSIS — J302 Other seasonal allergic rhinitis: Secondary | ICD-10-CM

## 2024-01-13 ENCOUNTER — Other Ambulatory Visit: Payer: Self-pay | Admitting: Family Medicine

## 2024-01-13 DIAGNOSIS — E559 Vitamin D deficiency, unspecified: Secondary | ICD-10-CM

## 2024-01-13 DIAGNOSIS — E119 Type 2 diabetes mellitus without complications: Secondary | ICD-10-CM

## 2024-01-13 DIAGNOSIS — E039 Hypothyroidism, unspecified: Secondary | ICD-10-CM

## 2024-01-26 ENCOUNTER — Other Ambulatory Visit: Payer: Self-pay | Admitting: Family Medicine

## 2024-01-29 ENCOUNTER — Other Ambulatory Visit (INDEPENDENT_AMBULATORY_CARE_PROVIDER_SITE_OTHER): Payer: No Typology Code available for payment source

## 2024-01-29 DIAGNOSIS — E559 Vitamin D deficiency, unspecified: Secondary | ICD-10-CM

## 2024-01-29 DIAGNOSIS — E119 Type 2 diabetes mellitus without complications: Secondary | ICD-10-CM

## 2024-01-29 DIAGNOSIS — E039 Hypothyroidism, unspecified: Secondary | ICD-10-CM

## 2024-01-29 NOTE — Addendum Note (Signed)
 Addended by: Gerry Krone on: 01/29/2024 07:32 AM   Modules accepted: Orders

## 2024-01-30 LAB — COMPREHENSIVE METABOLIC PANEL WITH GFR
AG Ratio: 1.5 (calc) (ref 1.0–2.5)
ALT: 23 U/L (ref 6–29)
AST: 22 U/L (ref 10–35)
Albumin: 4.1 g/dL (ref 3.6–5.1)
Alkaline phosphatase (APISO): 85 U/L (ref 37–153)
BUN: 10 mg/dL (ref 7–25)
CO2: 24 mmol/L (ref 20–32)
Calcium: 9.2 mg/dL (ref 8.6–10.4)
Chloride: 100 mmol/L (ref 98–110)
Creat: 0.73 mg/dL (ref 0.50–1.05)
Globulin: 2.8 g/dL (ref 1.9–3.7)
Glucose, Bld: 145 mg/dL — ABNORMAL HIGH (ref 65–99)
Potassium: 4.2 mmol/L (ref 3.5–5.3)
Sodium: 137 mmol/L (ref 135–146)
Total Bilirubin: 0.4 mg/dL (ref 0.2–1.2)
Total Protein: 6.9 g/dL (ref 6.1–8.1)
eGFR: 94 mL/min/{1.73_m2} (ref >=60)

## 2024-01-30 LAB — CBC WITH DIFFERENTIAL/PLATELET
Absolute Lymphocytes: 2002 {cells}/uL (ref 850–3900)
Absolute Monocytes: 705 {cells}/uL (ref 200–950)
Basophils Absolute: 38 {cells}/uL (ref 0–200)
Basophils Relative: 0.4 %
Eosinophils Absolute: 216 {cells}/uL (ref 15–500)
Eosinophils Relative: 2.3 %
HCT: 39 % (ref 35.0–45.0)
Hemoglobin: 12.6 g/dL (ref 11.7–15.5)
MCH: 29.8 pg (ref 27.0–33.0)
MCHC: 32.3 g/dL (ref 32.0–36.0)
MCV: 92.2 fL (ref 80.0–100.0)
MPV: 10.4 fL (ref 7.5–12.5)
Monocytes Relative: 7.5 %
Neutro Abs: 6439 {cells}/uL (ref 1500–7800)
Neutrophils Relative %: 68.5 %
Platelets: 278 10*3/uL (ref 140–400)
RBC: 4.23 10*6/uL (ref 3.80–5.10)
RDW: 14.3 % (ref 11.0–15.0)
Total Lymphocyte: 21.3 %
WBC: 9.4 10*3/uL (ref 3.8–10.8)

## 2024-01-30 LAB — LIPID PANEL
Cholesterol: 203 mg/dL — ABNORMAL HIGH (ref <200)
HDL: 39 mg/dL — ABNORMAL LOW (ref >=50)
Non-HDL Cholesterol (Calc): 164 mg/dL — ABNORMAL HIGH (ref <130)
Total CHOL/HDL Ratio: 5.2 (calc) — ABNORMAL HIGH (ref <5.0)
Triglycerides: 453 mg/dL — ABNORMAL HIGH (ref <150)

## 2024-01-30 LAB — HEMOGLOBIN A1C
Hgb A1c MFr Bld: 7.1 % — ABNORMAL HIGH (ref <5.7)
Mean Plasma Glucose: 157 mg/dL
eAG (mmol/L): 8.7 mmol/L

## 2024-01-30 LAB — MICROALBUMIN / CREATININE URINE RATIO
Creatinine, Urine: 19 mg/dL — ABNORMAL LOW (ref 20–275)
Microalb Creat Ratio: 11 mg/g{creat} (ref <30)
Microalb, Ur: 0.2 mg/dL

## 2024-01-30 LAB — VITAMIN D 25 HYDROXY (VIT D DEFICIENCY, FRACTURES): Vit D, 25-Hydroxy: 45 ng/mL (ref 30–100)

## 2024-01-30 LAB — TSH: TSH: 2.99 m[IU]/L (ref 0.40–4.50)

## 2024-01-31 ENCOUNTER — Ambulatory Visit: Payer: Self-pay | Admitting: Family Medicine

## 2024-02-03 LAB — OPHTHALMOLOGY REPORT-SCANNED

## 2024-02-08 ENCOUNTER — Other Ambulatory Visit: Payer: No Typology Code available for payment source

## 2024-02-15 ENCOUNTER — Ambulatory Visit (INDEPENDENT_AMBULATORY_CARE_PROVIDER_SITE_OTHER): Payer: No Typology Code available for payment source | Admitting: Family Medicine

## 2024-02-15 VITALS — BP 130/80 | HR 77 | Temp 98.0°F | Ht 66.34 in | Wt 282.2 lb

## 2024-02-15 DIAGNOSIS — Z23 Encounter for immunization: Secondary | ICD-10-CM | POA: Diagnosis not present

## 2024-02-15 DIAGNOSIS — Z0000 Encounter for general adult medical examination without abnormal findings: Secondary | ICD-10-CM | POA: Diagnosis not present

## 2024-02-15 DIAGNOSIS — E782 Mixed hyperlipidemia: Secondary | ICD-10-CM | POA: Diagnosis not present

## 2024-02-15 DIAGNOSIS — I10 Essential (primary) hypertension: Secondary | ICD-10-CM

## 2024-02-15 DIAGNOSIS — E119 Type 2 diabetes mellitus without complications: Secondary | ICD-10-CM

## 2024-02-15 DIAGNOSIS — Z7984 Long term (current) use of oral hypoglycemic drugs: Secondary | ICD-10-CM

## 2024-02-15 DIAGNOSIS — G629 Polyneuropathy, unspecified: Secondary | ICD-10-CM

## 2024-02-15 DIAGNOSIS — R059 Cough, unspecified: Secondary | ICD-10-CM

## 2024-02-15 DIAGNOSIS — Z7189 Other specified counseling: Secondary | ICD-10-CM

## 2024-02-15 DIAGNOSIS — E039 Hypothyroidism, unspecified: Secondary | ICD-10-CM

## 2024-02-15 DIAGNOSIS — G43909 Migraine, unspecified, not intractable, without status migrainosus: Secondary | ICD-10-CM

## 2024-02-15 MED ORDER — METFORMIN HCL ER 500 MG PO TB24: 500.0000 mg | ORAL_TABLET | Freq: Every day | ORAL | 1 refills | Status: DC

## 2024-02-15 MED ORDER — OMEPRAZOLE 40 MG PO CPDR: 40.0000 mg | DELAYED_RELEASE_CAPSULE | Freq: Every day | ORAL | 3 refills | Status: AC

## 2024-02-15 MED ORDER — GEMFIBROZIL 600 MG PO TABS: 600.0000 mg | ORAL_TABLET | Freq: Two times a day (BID) | ORAL | 3 refills | Status: AC

## 2024-02-15 MED ORDER — HYDROCHLOROTHIAZIDE 12.5 MG PO TABS: 12.5000 mg | ORAL_TABLET | Freq: Every day | ORAL | 3 refills | Status: AC

## 2024-02-15 NOTE — Patient Instructions (Addendum)
 You can call for a mammogram at [facility] at [facility].  [address] [place] [phone]  Recheck in about 3 months.  Fasting labs at or before the visit.  Take care.  Glad to see you. I would get a flu shot each fall.

## 2024-02-15 NOTE — Progress Notes (Unsigned)
 CPE- See plan.  Routine anticipatory guidance given to patient.  See health maintenance.  The possibility exists that previously documented standard health maintenance information may have been brought forward from a previous encounter into this note.  If needed, that same information has been updated to reflect the current situation based on today's encounter.    Tetanus 2018 Flu due in the fall.   PNA 2025 Shingles 2021 covid vaccine done Pap not due.  S/p hysterectomy.   Mammogram 2020, d/w pt.  See avs.   DXA not due.   Diet and exercise d/w pt.  She is working on both.   Living will d/w pt.  Would have her husband designated if patient were incapacitated.   H/o cough.  Some improvement with symbicort  but cost was prohibitive.  Using advair in the meantime.  Prn SABA about 1-2x/week, more in the summer.    Diabetes:  GI sx resolved off metformin .  Hypoglycemic episodes:no Hyperglycemic episodes:no Feet problems: see below.   Blood Sugars averaging:  eye exam within last year: due, d/w pt.   Labs d/w pt.    Neuropathy pain is better with gabapentin  but she is still putting up with symptoms at night.  She has neuro f/u pending. OTC liniment helped.   Hypothyroidism.  No ADE on med.  Compliant.  TSH wnl.  Labs d/w pt.    Hypertension:    Using medication without problems or lightheadedness: yes Chest pain with exertion:no Edema:no Short of breath:no  Elevated Cholesterol: Using medications without problems: yes, had been off med when labs were collected.  Restarted in the meantime.  Labs d/w pt.  Diet compliance: d/w pt.  Exercise: d/w pt.    Migraines.  Doing well, rare events.  On beta blocker and gabapentin .    PMH and SH reviewed  Meds, vitals, and allergies reviewed.   ROS: Per HPI.  Unless specifically indicated otherwise in HPI, the patient denies:  General: fever. Eyes: acute vision changes ENT: sore throat Cardiovascular: chest pain Respiratory: SOB GI:  vomiting GU: dysuria Musculoskeletal: acute back pain Derm: acute rash Neuro: acute motor dysfunction Psych: worsening mood Endocrine: polydipsia Heme: bleeding Allergy: hayfever  GEN: nad, alert and oriented HEENT: ncat NECK: supple w/o LA CV: rrr. PULM: ctab, no inc wob ABD: soft, +bs EXT: no edema SKIN: no acute rash  Diabetic foot exam: Normal inspection No skin breakdown No calluses  Normal DP pulses Normal sensation to light touch and but dec to monofilament Nails normal

## 2024-02-17 NOTE — Assessment & Plan Note (Signed)
Living will d/w pt. Would have her husband designated if patient were incapacitated.

## 2024-02-17 NOTE — Assessment & Plan Note (Signed)
 Some improvement with symbicort  but cost was prohibitive.  Using advair in the meantime.  Prn SABA about 1-2x/week, more in the summer.   Would continue as is.  Update me as needed.  Lungs are clear.

## 2024-02-17 NOTE — Assessment & Plan Note (Signed)
 Continue gabapentin .  She has neurology follow-up pending.

## 2024-02-17 NOTE — Assessment & Plan Note (Signed)
 Doing well, rare events.  On beta blocker and gabapentin .  Continue as is.

## 2024-02-17 NOTE — Assessment & Plan Note (Signed)
 No ADE on med.  Compliant.  TSH wnl.  Labs d/w pt.   continue replacement as is.

## 2024-02-17 NOTE — Assessment & Plan Note (Signed)
 Tetanus 2018 Flu due in the fall.   PNA 2025 Shingles 2021 covid vaccine done Pap not due.  S/p hysterectomy.   Mammogram 2020, d/w pt.  See avs.   DXA not due.   Diet and exercise d/w pt.  She is working on both.   Living will d/w pt.  Would have her husband designated if patient were incapacitated.

## 2024-02-17 NOTE — Assessment & Plan Note (Signed)
 Continue work on diet and exercise.  She recently restarted gemfibrozil .  We can recheck labs before the next visit.

## 2024-02-17 NOTE — Assessment & Plan Note (Addendum)
 Labs discussed with patient.  Continue work on diet.  She can try extended release metformin  to see if she can tolerate that better.  Routine cautions given to patient. Recheck in about 3 months.  Fasting labs at or before the visit.

## 2024-02-24 ENCOUNTER — Telehealth: Payer: Self-pay | Admitting: Registered Nurse

## 2024-02-24 ENCOUNTER — Encounter: Payer: Self-pay | Admitting: Registered Nurse

## 2024-02-24 DIAGNOSIS — K219 Gastro-esophageal reflux disease without esophagitis: Secondary | ICD-10-CM

## 2024-02-24 DIAGNOSIS — Z0000 Encounter for general adult medical examination without abnormal findings: Secondary | ICD-10-CM

## 2024-02-24 DIAGNOSIS — E785 Hyperlipidemia, unspecified: Secondary | ICD-10-CM

## 2024-02-24 DIAGNOSIS — I10 Essential (primary) hypertension: Secondary | ICD-10-CM

## 2024-02-24 NOTE — Telephone Encounter (Signed)
 Patient notified NP had appt with [facility] 02/15/24 as alternative for elevated BP.  Last filled hydrochlorothiazide  12.5mg  #90 11/17/2023 via [facility]  and omeprazole  DR 40mg  po daily #90.  Gemfibrozil  600mg  po BID #180 12/17/2023  PCM did labs LDL unable to calculate ratio total to HDL 5.2  Hgba1c 7.1 and BP  130/80  Patient had not recently taking gemfibrozil  and restarting use.  Hgba1c recheck in 3 months with PCM.  Alternative paperwork completed by NP and patient and [facility] form completed and given to HR Jen met requirements for insurance discount starting 18 May 2024 02/23/24  Dispensed 90 capsules omeprazole  DR 40mg  and Hydrochlorothiazide  12.5mg  daily 90 tabs and  gemfibrozil  600mg  po BID #180 to patient today from [facility]   Latest Reference Range & Units 01/29/24 07:32  Comprehensive metabolic panel with GFR  Rpt !  Sodium 135 - 146 mmol/L 137  Potassium 3.5 - 5.3 mmol/L 4.2  Chloride 98 - 110 mmol/L 100  CO2 20 - 32 mmol/L 24  Glucose 65 - 99 mg/dL 854 (H)  Mean Plasma Glucose mg/dL 842  BUN 7 - 25 mg/dL 10  Creatinine 9.49 - 8.94 mg/dL 9.26  Calcium 8.6 - 89.5 mg/dL 9.2  BUN/Creatinine Ratio 6 - 22 (calc) SEE NOTE:  eGFR > OR = 60 mL/min/1.69m2 94  AG Ratio 1.0 - 2.5 (calc) 1.5  AST 10 - 35 U/L 22  ALT 6 - 29 U/L 23  Total Protein 6.1 - 8.1 g/dL 6.9  Total Bilirubin 0.2 - 1.2 mg/dL 0.4  Total CHOL/HDL Ratio <5.0 (calc) 5.2 (H)  Cholesterol <200 mg/dL 796 (H)  HDL Cholesterol > OR = 50 mg/dL 39 (L)  LDL Cholesterol (Calc) mg/dL (calc) Pend  MICROALB/CREAT RATIO <30 mg/g creat 11  Non-HDL Cholesterol (Calc) <130 mg/dL (calc) 835 (H)  Triglycerides <150 mg/dL 546 (H)  Alkaline phosphatase (APISO) 37 - 153 U/L 85  Vitamin D , 25-Hydroxy 30 - 100 ng/mL 45  Globulin 1.9 - 3.7 g/dL (calc) 2.8  WBC 3.8 - 89.1 Thousand/uL 9.4  RBC 3.80 - 5.10 Million/uL 4.23  Hemoglobin 11.7 - 15.5 g/dL 87.3  HCT 64.9 - 54.9 % 39.0  MCV 80.0 - 100.0 fL 92.2  MCH 27.0 - 33.0 pg 29.8  MCHC 32.0 - 36.0 g/dL 67.6  RDW  88.9 - 84.9 % 14.3  Platelets 140 - 400 Thousand/uL 278  MPV 7.5 - 12.5 fL 10.4  Neutrophils % 68.5  Monocytes Relative % 7.5  Eosinophil % 2.3  Basophil % 0.4  NEUT# 1,500 - 7,800 cells/uL 6,439  Total Lymphocyte % 21.3  Eosinophils Absolute 15 - 500 cells/uL 216  Basophils Absolute 0 - 200 cells/uL 38  Absolute Monocytes 200 - 950 cells/uL 705  eAG (mmol/L) mmol/L 8.7  Hemoglobin A1C <5.7 % 7.1 (H)  TSH 0.40 - 4.50 mIU/L 2.99  !: Data is abnormal (H): Data is abnormally high (L): Data is abnormally low Rpt: View report in Results Review for more information

## 2024-02-28 ENCOUNTER — Encounter: Payer: Self-pay | Admitting: Family Medicine

## 2024-03-16 ENCOUNTER — Ambulatory Visit: Admitting: Diagnostic Neuroimaging

## 2024-03-16 ENCOUNTER — Encounter: Payer: Self-pay | Admitting: Diagnostic Neuroimaging

## 2024-03-16 VITALS — BP 144/81 | HR 78 | Ht 66.0 in | Wt 284.0 lb

## 2024-03-16 DIAGNOSIS — Z7984 Long term (current) use of oral hypoglycemic drugs: Secondary | ICD-10-CM

## 2024-03-16 DIAGNOSIS — E1142 Type 2 diabetes mellitus with diabetic polyneuropathy: Secondary | ICD-10-CM

## 2024-03-16 NOTE — Progress Notes (Signed)
 [facility]  PATIENT: [name] DOB: 03-Feb-1961  REFERRING CLINICIAN: Hyatt, Max T, DPM HISTORY FROM: patient  REASON FOR VISIT: new consult   HISTORICAL  CHIEF COMPLAINT:  Chief Complaint  Patient presents with   Numbness    Rm 7 with mother Erminio Pt is well, reports she is having burning, tingling, numbness and pins and needles in both feet for about 5 yrs, symptoms have progressed over time.     HISTORY OF PRESENT ILLNESS:   63 year old female with numbness tingling and burning in feet for past 5 years.  Has had increasing in A1c and 50 pound weight gain since 2020.  Some intermittent sensations in fingertips.  Also has some aching sensations in the feet with some sharp pains.  On gabapentin  for pain relief.  No significant low back pain or neck pain.   REVIEW OF SYSTEMS: Full 14 system review of systems performed and negative with exception of: As per HPI.  ALLERGIES: Allergies  Allergen Reactions   Codeine Nausea And Vomiting   Metformin  And Related     GI upset with short acting metformin .      HOME MEDICATIONS: Outpatient Medications Prior to Visit  Medication Sig Dispense Refill   albuterol  (VENTOLIN  HFA) 108 (90 Base) MCG/ACT inhaler Inhale 1-2 puffs into the lungs every 4 (four) hours as needed for wheezing or shortness of breath. 34 each 1   ARMOUR THYROID  30 MG tablet TAKE 1 TABLET BY MOUTH EVERY DAY WITH BREAKFAST 90 tablet 3   carvedilol  (COREG ) 6.25 MG tablet TAKE 1 TABLET BY MOUTH TWICE DAILY WITH MEALS 180 tablet 3   Cholecalciferol (VITAMIN D3) 50 MCG (2000 UT) capsule Take 1 capsule (2,000 Units total) by mouth daily.     Chromium-Cinnamon (CINNAMON PLUS CHROMIUM) [phone] MCG-MG CAPS      diclofenac  Sodium (VOLTAREN ) 1 % GEL APPLY 2 GRAMS TOPICALLY FOUR TIMES DAILY AS NEEDED 100 g 2   fluticasone  (FLONASE ) 50 MCG/ACT nasal spray INSTILL 1 SPRAY IN EACH NOSTRIL TWICE DAILY 48 g 2   fluticasone -salmeterol (ADVAIR DISKUS)  250-50 MCG/ACT AEPB Inhale 1 puff into the lungs in the morning and at bedtime. 3 each 3   furosemide  (LASIX ) 20 MG tablet TAKE 1 TABLET BY MOUTH EVERY DAY 90 tablet 3   gabapentin  (NEURONTIN ) 100 MG capsule TAKE 1 CAPSULE BY MOUTH TWICE DAILY AS NEEDED 180 capsule 3   gabapentin  (NEURONTIN ) 300 MG capsule TAKE ONE CAPSULE BY MOUTH EVERY DAY AND 2-3 CAPSULES AT NIGHT AS DIRECTED 360 capsule 3   gemfibrozil  (LOPID ) 600 MG tablet Take 1 tablet (600 mg total) by mouth 2 (two) times daily before a meal. 180 tablet 3   Halcinonide  0.1 % CREA APPLY TOPICALLY TO THE AFFECTED AREA TWICE DAILY IF NEEDED 60 g 1   hydrochlorothiazide  (HYDRODIURIL ) 12.5 MG tablet Take 1 tablet (12.5 mg total) by mouth daily. 90 tablet 3   Lutein 20 MG CAPS Take by mouth.     metFORMIN  (GLUCOPHAGE -XR) 500 MG 24 hr tablet Take 1-2 tablets (500-1,000 mg total) by mouth daily with breakfast. 180 tablet 1   montelukast  (SINGULAIR ) 10 MG tablet TAKE 1 TABLET BY MOUTH AT BEDTIME 90 tablet 3   Multiple Vitamin (MULTIVITAMIN) capsule Take 1 capsule by mouth daily.     omeprazole  (PRILOSEC) 40 MG capsule Take 1 capsule (40 mg total) by mouth daily. 90 capsule 3   Potassium 99 MG TABS Take 1-2 tablets (99-198 mg total) by mouth daily.  Turmeric Curcumin 500 MG CAPS daily.      vitamin B-12 (CYANOCOBALAMIN) 1000 MCG tablet Take 1 tablet (1,000 mcg total) by mouth daily.     promethazine  (PHENERGAN ) 6.25 MG/5ML syrup Take 5 mLs (6.25 mg total) by mouth at bedtime as needed for up to 24 days for nausea or vomiting. (Patient not taking: Reported on 03/16/2024) 120 mL 0   No facility-administered medications prior to visit.    PAST MEDICAL HISTORY: Past Medical History:  Diagnosis Date   Anxiety    Diabetes mellitus without complication (HCC)    History of colon polyps    History of eosinophilia    ESOPHAGEAL   Hyperlipidemia    Hypertension    Hypothyroidism    Migraine    Neuropathy    Nocturia    Pelvic prolapse     Seasonal allergies    Urgency of urination    Vitamin D  deficiency    Wears glasses     PAST SURGICAL HISTORY: Past Surgical History:  Procedure Laterality Date   ABDOMINAL HYSTERECTOMY  2001   w/  Bladder Suspension   ANTERIOR AND POSTERIOR REPAIR WITH SACROSPINOUS FIXATION N/A 07/24/2014   Procedure: SACROSPINOUS LIGAMENT SUSPENSION ;  Surgeon: LELON Tanda Mulch, MD;  Location: [facility];  Service: Gynecology;  Laterality: N/A;   COLONOSCOPY W/ POLYPECTOMY  04-25-2014   LAPAROSCOPIC CHOLECYSTECTOMY  2008   VAGINAL HYSTERECTOMY N/A 07/24/2014   Procedure: RESECTION OF CERVIX ;  Surgeon: LELON Tanda Mulch, MD;  Location: [facility];  Service: Gynecology;  Laterality: N/A;    FAMILY HISTORY: Family History  Problem Relation Age of Onset   Hyperlipidemia Mother    Breast cancer Sister 61   Colon cancer Neg Hx     SOCIAL HISTORY: Social History   Socioeconomic History   Marital status: Married    Spouse name: Not on file   Number of children: 3   Years of education: Not on file   Highest education level: Associate degree: occupational, Scientist, product/process development, or vocational program  Occupational History    Employer: [employer]  Tobacco Use   Smoking status: Former    Current packs/day: 0.00    Average packs/day: 1 pack/day for 1 year (1.0 ttl pk-yrs)    Types: Cigarettes    Start date: 07/19/1981    Quit date: 07/19/1982    Years since quitting: 41.6   Smokeless tobacco: Never  Vaping Use   Vaping status: Never Used  Substance and Sexual Activity   Alcohol use: No   Drug use: No   Sexual activity: Not on file  Other Topics Concern   Not on file  Social History Narrative   Working at [employer].   Social Drivers of Corporate investment banker Strain: Low Risk  (02/15/2024)   Overall Financial Resource Strain (CARDIA)    Difficulty of Paying Living Expenses: Not very hard  Food Insecurity: No Food Insecurity (02/15/2024)   Hunger Vital  Sign    Worried About Running Out of Food in the Last Year: Never true    Ran Out of Food in the Last Year: Never true  Transportation Needs: No Transportation Needs (02/15/2024)   PRAPARE - Administrator, Civil Service (Medical): No    Lack of Transportation (Non-Medical): No  Physical Activity: Insufficiently Active (02/15/2024)   Exercise Vital Sign    Days of Exercise per Week: 5 days    Minutes of Exercise per Session: 10 min  Stress: Stress Concern Present (02/15/2024)   [employer] of Occupational Health - Occupational Stress Questionnaire    Feeling of Stress: To some extent  Social Connections: Socially Integrated (02/15/2024)   Social Connection and Isolation Panel    Frequency of Communication with Friends and Family: More than three times a week    Frequency of Social Gatherings with Friends and Family: More than three times a week    Attends Religious Services: More than 4 times per year    Active Member of [employer] or Organizations: Yes    Attends Banker Meetings: More than 4 times per year    Marital Status: Married  Catering manager Violence: Unknown (11/21/2021)   Received from [facility]   HITS    Physically Hurt: Not on file    Insult or Talk Down To: Not on file    Threaten Physical Harm: Not on file    Scream or Curse: Not on file     PHYSICAL EXAM  GENERAL EXAM/CONSTITUTIONAL: Vitals:  Vitals:   03/16/24 0842  BP: (!) 144/81  Pulse: 78  Weight: 284 lb (128.8 kg)  Height: 5' 6 (1.676 m)   Body mass index is 45.84 kg/m. Wt Readings from Last 3 Encounters:  03/16/24 284 lb (128.8 kg)  02/15/24 282 lb 3.2 oz (128 kg)  09/15/23 290 lb 6 oz (131.7 kg)   Patient is in no distress; well developed, nourished and groomed; neck is supple  CARDIOVASCULAR: Examination of carotid arteries is normal; no carotid bruits Regular rate and rhythm, no murmurs Examination of peripheral vascular system by observation and palpation is  normal  EYES: Ophthalmoscopic exam of optic discs and posterior segments is normal; no papilledema or hemorrhages No results found.  MUSCULOSKELETAL: Gait, strength, tone, movements noted in Neurologic exam below  NEUROLOGIC: MENTAL STATUS:      No data to display         awake, alert, oriented to person, place and time recent and remote memory intact normal attention and concentration language fluent, comprehension intact, naming intact fund of knowledge appropriate  CRANIAL NERVE:  2nd - no papilledema on fundoscopic exam 2nd, 3rd, 4th, 6th - pupils equal and reactive to light, visual fields full to confrontation, extraocular muscles intact, no nystagmus 5th - facial sensation symmetric 7th - facial strength symmetric 8th - hearing intact 9th - palate elevates symmetrically, uvula midline 11th - shoulder shrug symmetric 12th - tongue protrusion midline  MOTOR:  normal bulk and tone, full strength in the BUE, BLE  SENSORY:  normal and symmetric to light touch, pinprick, temperature, vibration; EXCEPT DECR VIB IN TOES; ABSENT PP IN TOES FEET ARE SLIGHTLY SWOLLEN AND RED IN COLOR BILATERALLY; ANT BITE SEQUELAE IN RIGHT FOOT  COORDINATION:  finger-nose-finger, fine finger movements normal  REFLEXES:  deep tendon reflexes TRACE and symmetric  GAIT/STATION:  narrow based gait     DIAGNOSTIC DATA (LABS, IMAGING, TESTING) - I reviewed patient records, labs, notes, testing and imaging myself where available.  Lab Results  Component Value Date   WBC 9.4 01/29/2024   HGB 12.6 01/29/2024   HCT 39.0 01/29/2024   MCV 92.2 01/29/2024   PLT 278 01/29/2024      Component Value Date/Time   NA 137 01/29/2024 0732   NA 143 09/15/2023 0856   K 4.2 01/29/2024 0732   CL 100 01/29/2024 0732   CO2 24 01/29/2024 0732   GLUCOSE 145 (H) 01/29/2024 0732   BUN 10 01/29/2024 0732  BUN 12 09/15/2023 0856   CREATININE 0.73 01/29/2024 0732   CALCIUM 9.2 01/29/2024 0732    PROT 6.9 01/29/2024 0732   PROT 7.4 09/15/2023 0856   ALBUMIN 4.4 09/15/2023 0856   AST 22 01/29/2024 0732   AST 19 07/15/2017 0000   ALT 23 01/29/2024 0732   ALT 19 07/15/2017 0000   ALKPHOS 84 09/15/2023 0856   BILITOT 0.4 01/29/2024 0732   BILITOT 0.3 09/15/2023 0856   GFRNONAA >60 07/24/2020 0954   GFRAA >60 05/11/2020 0813   Lab Results  Component Value Date   CHOL 203 (H) 01/29/2024   HDL 39 (L) 01/29/2024   LDLCALC  01/29/2024     Comment:     . LDL cholesterol not calculated. Triglyceride levels greater than 400 mg/dL invalidate calculated LDL results. . Reference range: <100 . Desirable range <100 mg/dL for primary prevention;   <70 mg/dL for patients with CHD or diabetic patients  with > or = 2 CHD risk factors. SABRA LDL-C is now calculated using the Martin-Hopkins  calculation, which is a validated novel method providing  better accuracy than the Friedewald equation in the  estimation of LDL-C.  Gladis APPLETHWAITE et al. SANDREA. 7986;689(80): 2061-2068  (http://education.[facility].com/faq/FAQ164)    LDLDIRECT 107.0 10/24/2022   TRIG 453 (H) 01/29/2024   CHOLHDL 5.2 (H) 01/29/2024   Lab Results  Component Value Date   HGBA1C 7.1 (H) 01/29/2024   Lab Results  Component Value Date   VITAMINB12 464 07/14/2022   Lab Results  Component Value Date   TSH 2.99 01/29/2024       ASSESSMENT AND PLAN  63 y.o. year old female here with:   Dx:  1. Diabetic polyneuropathy associated with type 2 diabetes mellitus (HCC)     PLAN:  NEUROPATHY (gradual onset since ~2020: due to diabetes (A1c 7.1), BMI 46 (50lb weight gain sine 2020), hypertriglyceridemia 453)) - weight mgmt will like greatly improve these symptoms; consider GLP1 agonists - continue gabapentin  (may increase up 300-600mg  three times a day) and topical lidocaine  as tolerated for pain control - consider duloxetine 30-60mg  daily - consider capsaicin cream, lidocaine  patch / cream, alpha-lipoic acid  600mg  daily - caution with foot hygiene and foot care  Return for pending if symptoms worsen or fail to improve, return to PCP.    EDUARD FABIENE HANLON, MD 03/16/2024, 9:47 AM Certified in Neurology, Neurophysiology and Neuroimaging  [facility] [address] [place], [place] [address] [phone]

## 2024-03-16 NOTE — Patient Instructions (Addendum)
  NEUROPATHY (gradual since ~2020: due to diabetes (A1c 7.1), BMI 46 (50lb weight gain sine 2020), hypertriglyceridemia 453)) - weight mgmt will like greatly improve these symptoms; consider GLP1 agonists - continue gabapentin  (may increase up 300-600mg  three times a day) and topical lidocaine  as tolerated for pain control - consider duloxetine 30-60mg  daily - consider capsaicin cream, lidocaine  patch / cream, alpha-lipoic acid 600mg  daily - caution with foot hygiene and foot care

## 2024-03-22 ENCOUNTER — Ambulatory Visit: Admitting: Registered Nurse

## 2024-03-22 ENCOUNTER — Encounter: Payer: Self-pay | Admitting: Registered Nurse

## 2024-03-22 VITALS — BP 144/86 | HR 73 | Temp 99.2°F

## 2024-03-22 DIAGNOSIS — J019 Acute sinusitis, unspecified: Secondary | ICD-10-CM

## 2024-03-22 MED ORDER — PREDNISONE 10 MG (21) PO TBPK
ORAL_TABLET | ORAL | Status: DC
Start: 2024-03-22 — End: 2024-05-26

## 2024-03-22 MED ORDER — AMOXICILLIN-POT CLAVULANATE 875-125 MG PO TABS
1.0000 | ORAL_TABLET | Freq: Two times a day (BID) | ORAL | 0 refills | Status: AC
Start: 2024-03-22 — End: 2024-03-29

## 2024-03-22 NOTE — Progress Notes (Signed)
 Established Patient Office Visit  Subjective   Patient ID: [name], female    DOB: 1961-04-14  Age: 63 y.o. MRN: [id number]  Chief Complaint  Patient presents with   Sinusitis    Started last Wednesday 30 Jul mucous green productive harsh cough headache home covid test negative today    63y/o established caucasian female symptoms started 30 Jul has been using sinus rinse, antihistamine, flonase  without relief.  Headache, cough, teeth hurting, sinus pain/pressure and right ear pain.  Mucous green today cough productive not needing inhaler at this time  Home covid test negative  last antibiotic/prednisone  use 11/17/23 [facility]      Review of Systems  Constitutional:  Positive for malaise/fatigue. Negative for chills, diaphoresis and fever.  HENT:  Positive for congestion, ear pain and sinus pain. Negative for ear discharge and nosebleeds.   Eyes:  Negative for pain, discharge and redness.  Respiratory:  Positive for cough and sputum production. Negative for hemoptysis, shortness of breath, wheezing and stridor.   Cardiovascular:  Negative for chest pain.  Gastrointestinal:  Negative for diarrhea, nausea and vomiting.  Genitourinary:  Negative for dysuria.  Musculoskeletal:  Positive for myalgias. Negative for back pain, joint pain and neck pain.  Skin:  Negative for itching and rash.  Neurological:  Positive for headaches. Negative for dizziness, tingling, tremors, sensory change, speech change, focal weakness, seizures and weakness.      Objective:     BP (!) 144/86 (BP Location: Left Arm, Patient Position: Sitting, Cuff Size: Large)   Pulse 73   Temp 99.2 F (37.3 C) (Temporal)   SpO2 97%    Physical Exam Vitals and nursing note reviewed.  Constitutional:      General: She is awake. She is not in acute distress.    Appearance: She is well-developed and well-groomed. She is obese. She is ill-appearing. She is not toxic-appearing or diaphoretic.  HENT:     Head:  Normocephalic and atraumatic.     Jaw: There is normal jaw occlusion.     Salivary Glands: Right salivary gland is not diffusely enlarged or tender. Left salivary gland is not diffusely enlarged or tender.     Right Ear: Hearing, ear canal and external ear normal. No decreased hearing noted. No laceration, drainage, swelling or tenderness. A middle ear effusion is present. There is no impacted cerumen. No foreign body. No mastoid tenderness. No PE tube. No hemotympanum. Tympanic membrane is not injected, scarred, perforated, erythematous or retracted.     Left Ear: Hearing, ear canal and external ear normal. No decreased hearing noted. No laceration, drainage, swelling or tenderness. A middle ear effusion is present. There is no impacted cerumen. No foreign body. No mastoid tenderness. No PE tube. No hemotympanum. Tympanic membrane is not injected, scarred, perforated, erythematous or retracted.     Ears:     Comments: Bilateral TMs intact air fluid level clear; no debris bilateral auditory canals    Nose: Nasal tenderness, mucosal edema, congestion and rhinorrhea present. No laceration. Rhinorrhea is clear.     Right Nostril: No epistaxis.     Left Nostril: No epistaxis.     Right Turbinates: Enlarged and swollen. Not pale.     Left Turbinates: Enlarged and swollen. Not pale.     Right Sinus: Maxillary sinus tenderness and frontal sinus tenderness present.     Left Sinus: Maxillary sinus tenderness and frontal sinus tenderness present.     Comments: Mildly TTP frontal and maxillary sinuses bilaterally; cobblestoning  posterior pharynx; bilateral nasal turbinates edema/erythema clear discharge; bilateral allergic shiners; lower eyelid nonpitting edema 1+/4    Mouth/Throat:     Lips: Pink. No lesions.     Mouth: Mucous membranes are moist. No oral lesions or angioedema.     Dentition: No gum lesions.     Tongue: No lesions. Tongue does not deviate from midline.     Palate: No mass and lesions.      Pharynx: Uvula midline. Pharyngeal swelling, posterior oropharyngeal erythema and postnasal drip present. No oropharyngeal exudate or uvula swelling.     Tonsils: No tonsillar exudate. 1+ on the right. 1+ on the left.     Comments: Bilateral tonsils enlarged 1+/4 equal Eyes:     General: Lids are normal. Vision grossly intact. Gaze aligned appropriately. Allergic shiner present. No scleral icterus.       Right eye: No discharge.        Left eye: No discharge.     Extraocular Movements:     Right eye: Normal extraocular motion and no nystagmus.     Left eye: Normal extraocular motion and no nystagmus.     Conjunctiva/sclera: Conjunctivae normal.     Right eye: Right conjunctiva is not injected. No chemosis or exudate.    Left eye: Left conjunctiva is not injected. No chemosis or exudate.    Pupils: Pupils are equal, round, and reactive to light.  Neck:     Trachea: Trachea and phonation normal.  Cardiovascular:     Rate and Rhythm: Normal rate and regular rhythm.     Pulses: Normal pulses.          Radial pulses are 2+ on the right side and 2+ on the left side.     Heart sounds: Normal heart sounds, S1 normal and S2 normal. No murmur heard.    No friction rub.  Pulmonary:     Effort: Pulmonary effort is normal. No respiratory distress.     Breath sounds: No stridor. Examination of the right-lower field reveals decreased breath sounds. Examination of the left-lower field reveals decreased breath sounds. Decreased breath sounds present. No wheezing, rhonchi or rales.     Comments: Bilateral lower lobes slightly decreased breath sounds; spoke full sentences in exam room without difficulty; throat clearing and nasal congestion audible Abdominal:     Palpations: Abdomen is soft.  Musculoskeletal:        General: Normal range of motion.     Right hand: Normal strength. Normal capillary refill.     Left hand: Normal strength. Normal capillary refill.     Cervical back: Normal range of motion  and neck supple. No swelling, edema, deformity, erythema, signs of trauma, lacerations, rigidity, spasms, torticollis, tenderness or crepitus. No pain with movement or muscular tenderness. Normal range of motion.     Thoracic back: No swelling, edema, deformity, signs of trauma, lacerations, spasms or tenderness. Normal range of motion.  Lymphadenopathy:     Head:     Right side of head: No submental, submandibular, tonsillar, preauricular, posterior auricular or occipital adenopathy.     Left side of head: No submental, submandibular, tonsillar, preauricular, posterior auricular or occipital adenopathy.     Cervical: No cervical adenopathy.     Right cervical: No superficial, deep or posterior cervical adenopathy.    Left cervical: No superficial, deep or posterior cervical adenopathy.  Skin:    General: Skin is warm and dry.     Capillary Refill: Capillary refill takes less than 2 seconds.  Coloration: Skin is not ashen, cyanotic, jaundiced, mottled, pale or sallow.     Findings: Lesion and rash present. No abrasion, abscess, acne, bruising, burn, ecchymosis, erythema, signs of injury, laceration, petechiae or wound. Rash is macular. Rash is not crusting, nodular, papular, purpuric, pustular, scaling, urticarial or vesicular.     Nails: There is no clubbing.     Comments: Hypopigmented macular nummular lesion right anterior superior forehead clean and dry well demarcated borders stated had healing lesion treated by PCM and after healed lighter in color than surrounding skin  Neurological:     General: No focal deficit present.     Mental Status: She is alert and oriented to person, place, and time. Mental status is at baseline.     GCS: GCS eye subscore is 4. GCS verbal subscore is 5. GCS motor subscore is 6.     Cranial Nerves: No cranial nerve deficit, dysarthria or facial asymmetry.     Sensory: Sensation is intact. No sensory deficit.     Motor: Motor function is intact. No weakness.      Coordination: Coordination is intact. Coordination normal.     Gait: Gait normal.     Comments: In/out of chair without difficulty; bilateral hand grasp equal 5/5; gait sure and steady in clinic  Psychiatric:        Attention and Perception: Attention and perception normal.        Mood and Affect: Mood and affect normal.        Speech: Speech normal.        Behavior: Behavior normal. Behavior is cooperative.        Thought Content: Thought content normal.        Cognition and Memory: Cognition and memory normal.        Judgment: Judgment normal.      No results found for any visits on 03/22/24.    The 10-year ASCVD risk score (Arnett DK, et al., 2019) is: 14.4%    Assessment & Plan:   Problem List Items Addressed This Visit   None Visit Diagnoses       Acute rhinosinusitis    -  Primary   Relevant Medications   amoxicillin -clavulanate (AUGMENTIN ) 875-125 MG tablet   predniSONE  (STERAPRED UNI-PAK 21 TAB) 10 MG (21) TBPK tablet      Continue flonase  1 spray each nostril BID, saline 2 sprays each nostril q2h wa prn congestion.  start augmentin  875mg  po BID x 7 days #20 RF0 and prednisone  10mg  taper with breakfast (60/50/40/30/20/10x1 day each) #21 RF0 dispensed from [facility] to patient  Denied personal or family history of ENT cancer.  Shower BID especially prior to bed. No evidence of systemic bacterial infection, non toxic and well hydrated.  I do not see where any further testing or imaging is necessary at this time.   I will suggest supportive care, rest, good hygiene and encourage the patient to take adequate fluids.  The patient is to return to clinic or EMERGENCY ROOM if symptoms worsen or change significantly.  Exitcare handouts on sinusitis and sinus rinse.  Patient verbalized agreement and understanding of treatment plan and had no further questions at this time.   P2:  Hand washing and cover cough  Return if symptoms worsen or fail to improve.    Diane DELENA Hope,  NP

## 2024-03-22 NOTE — Patient Instructions (Signed)
 How to Perform a Sinus Rinse A sinus rinse is a home treatment that is used to rinse your sinuses with a germ-free (sterile) mixture of salt and water (saline solution). Sinuses are air-filled spaces in your skull that are behind the bones of your face and forehead. They open into your nasal cavity. A sinus rinse can help to clear mucus, dirt, dust, or pollen from your nasal cavity. You may do a sinus rinse when you have a cold, a virus, nasal allergy symptoms, a sinus infection, or stuffiness in your nose or sinuses. What are the risks? A sinus rinse is generally safe and effective. However, there are a few risks, which include: A burning sensation in your sinuses. This may happen if you do not make the saline solution as directed. Be sure to follow all directions when making the saline solution. Nasal irritation. Infection. This may be from unclean supplies or from contaminated water. Infection from contaminated water is rare, but possible. Do not do a sinus rinse if you have had ear or nasal surgery, ear infection, or plugged ears, unless recommended by your health care provider. Supplies needed: Saline solution or powder. Distilled or sterile water to mix with saline powder. You may use boiled and cooled tap water. Boil tap water for 5 minutes; cool until it is lukewarm. Use within 24 hours. Do not use regular tap water to mix with the saline solution. Neti pot or nasal rinse bottle. These supplies release the saline solution into your nose and through your sinuses. Neti pots and nasal rinse bottles can be purchased at [employer], a health food store, or online. How to perform a sinus rinse  Wash your hands with soap and water for at least 20 seconds. If soap and water are not available, use hand sanitizer. Wash your device according to the directions that came with the product and then dry it. Use the solution that comes with your product or one that is sold separately in stores.  Follow the mixing directions on the package to mix with sterile or distilled water. Fill the device with the amount of saline solution noted in the device instructions. Stand by a sink and tilt your head sideways over the sink. Place the spout of the device in your upper nostril (the one closer to the ceiling). Gently pour or squeeze the saline solution into your nasal cavity. The liquid should drain out from the lower nostril if you are not too congested. While rinsing, breathe through your open mouth. Gently blow your nose to clear any mucus and rinse solution. Blowing too hard may cause ear pain. Turn your head in the other direction and repeat in your other nostril. Clean and rinse your device with clean water and then air-dry it. Talk with your health care provider or pharmacist if you have questions about how to do a sinus rinse. Summary A sinus rinse is a home treatment that is used to rinse your sinuses with a sterile mixture of salt and water (saline solution). You may do a sinus rinse when you have a cold, a virus, nasal allergy symptoms, a sinus infection, or stuffiness in your nose or sinuses. A sinus rinse is generally safe and effective. Follow all instructions carefully. This information is not intended to replace advice given to you by your health care provider. Make sure you discuss any questions you have with your health care provider. Document Revised: 01/21/2021 Document Reviewed: 01/21/2021 Elsevier Patient Education  2024 [employer] Sinus  Infection, Adult A sinus infection, also called sinusitis, is inflammation of your sinuses. Sinuses are hollow spaces in the bones around your face. Your sinuses are located: Around your eyes. In the middle of your forehead. Behind your nose. In your cheekbones. Mucus normally drains out of your sinuses. When your nasal tissues become inflamed or swollen, mucus can become trapped or blocked. This allows bacteria, viruses, and fungi  to grow, which leads to infection. Most infections of the sinuses are caused by a virus. A sinus infection can develop quickly. It can last for up to 4 weeks (acute) or for more than 12 weeks (chronic). A sinus infection often develops after a cold. What are the causes? This condition is caused by anything that creates swelling in the sinuses or stops mucus from draining. This includes: Allergies. Asthma. Infection from bacteria or viruses. Deformities or blockages in your nose or sinuses. Abnormal growths in the nose (nasal polyps). Pollutants, such as chemicals or irritants in the air. Infection from fungi. This is rare. What increases the risk? You are more likely to develop this condition if you: Have a weak body defense system (immune system). Do a lot of swimming or diving. Overuse nasal sprays. Smoke. What are the signs or symptoms? The main symptoms of this condition are pain and a feeling of pressure around the affected sinuses. Other symptoms include: Stuffy nose or congestion that makes it difficult to breathe through your nose. Thick yellow or greenish drainage from your nose. Tenderness, swelling, and warmth over the affected sinuses. A cough that may get worse at night. Decreased sense of smell and taste. Extra mucus that collects in the throat or the back of the nose (postnasal drip) causing a sore throat or bad breath. Tiredness (fatigue). Fever. How is this diagnosed? This condition is diagnosed based on: Your symptoms. Your medical history. A physical exam. Tests to find out if your condition is acute or chronic. This may include: Checking your nose for nasal polyps. Viewing your sinuses using a device that has a light (endoscope). Testing for allergies or bacteria. Imaging tests, such as an MRI or CT scan. In rare cases, a bone biopsy may be done to rule out more serious types of fungal sinus disease. How is this treated? Treatment for a sinus infection  depends on the cause and whether your condition is chronic or acute. If caused by a virus, your symptoms should go away on their own within 10 days. You may be given medicines to relieve symptoms. They include: Medicines that shrink swollen nasal passages (decongestants). A spray that eases inflammation of the nostrils (topical intranasal corticosteroids). Rinses that help get rid of thick mucus in your nose (nasal saline washes). Medicines that treat allergies (antihistamines). Over-the-counter pain relievers. If caused by bacteria, your health care provider may recommend waiting to see if your symptoms improve. Most bacterial infections will get better without antibiotic medicine. You may be given antibiotics if you have: A severe infection. A weak immune system. If caused by narrow nasal passages or nasal polyps, surgery may be needed. Follow these instructions at home: Medicines Take, use, or apply over-the-counter and prescription medicines only as told by your health care provider. These may include nasal sprays. If you were prescribed an antibiotic medicine, take it as told by your health care provider. Do not stop taking the antibiotic even if you start to feel better. Hydrate and humidify  Drink enough fluid to keep your urine pale yellow. Staying hydrated will help  to thin your mucus. Use a cool mist humidifier to keep the humidity level in your home above 50%. Inhale steam for 10-15 minutes, 3-4 times a day, or as told by your health care provider. You can do this in the bathroom while a hot shower is running. Limit your exposure to cool or dry air. Rest Rest as much as possible. Sleep with your head raised (elevated). Make sure you get enough sleep each night. General instructions  Apply a warm, moist washcloth to your face 3-4 times a day or as told by your health care provider. This will help with discomfort. Use nasal saline washes as often as told by your health care  provider. Wash your hands often with soap and water to reduce your exposure to germs. If soap and water are not available, use hand sanitizer. Do not smoke. Avoid being around people who are smoking (secondhand smoke). Keep all follow-up visits. This is important. Contact a health care provider if: You have a fever. Your symptoms get worse. Your symptoms do not improve within 10 days. Get help right away if: You have a severe headache. You have persistent vomiting. You have severe pain or swelling around your face or eyes. You have vision problems. You develop confusion. Your neck is stiff. You have trouble breathing. These symptoms may be an emergency. Get help right away. Call 911. Do not wait to see if the symptoms will go away. Do not drive yourself to the hospital. Summary A sinus infection is soreness and inflammation of your sinuses. Sinuses are hollow spaces in the bones around your face. This condition is caused by nasal tissues that become inflamed or swollen. The swelling traps or blocks the flow of mucus. This allows bacteria, viruses, and fungi to grow, which leads to infection. If you were prescribed an antibiotic medicine, take it as told by your health care provider. Do not stop taking the antibiotic even if you start to feel better. Keep all follow-up visits. This is important. This information is not intended to replace advice given to you by your health care provider. Make sure you discuss any questions you have with your health care provider. Document Revised: 07/09/2021 Document Reviewed: 07/09/2021 Elsevier Patient Education  2024 [employer].

## 2024-04-30 ENCOUNTER — Other Ambulatory Visit: Payer: Self-pay | Admitting: Family Medicine

## 2024-05-02 ENCOUNTER — Encounter: Payer: Self-pay | Admitting: Family Medicine

## 2024-05-04 ENCOUNTER — Other Ambulatory Visit: Payer: Self-pay | Admitting: Family Medicine

## 2024-05-04 MED ORDER — DICLOFENAC SODIUM 1 % EX GEL: CUTANEOUS | 2 refills | Status: AC

## 2024-05-04 MED ORDER — DICLOFENAC SODIUM 1 % EX GEL: CUTANEOUS | 2 refills | Status: DC

## 2024-05-04 MED ORDER — HALCINONIDE 0.1 % EX CREA: TOPICAL_CREAM | CUTANEOUS | 1 refills | Status: AC

## 2024-05-04 MED ORDER — HALCINONIDE 0.1 % EX CREA: TOPICAL_CREAM | CUTANEOUS | 1 refills | Status: DC

## 2024-05-18 ENCOUNTER — Other Ambulatory Visit

## 2024-05-18 DIAGNOSIS — E119 Type 2 diabetes mellitus without complications: Secondary | ICD-10-CM | POA: Diagnosis not present

## 2024-05-18 LAB — LIPID PANEL
Cholesterol: 158 mg/dL (ref 0–200)
HDL: 32.3 mg/dL — ABNORMAL LOW (ref >39.00)
LDL Cholesterol: 82 mg/dL (ref 0–99)
NonHDL: 125.89
Total CHOL/HDL Ratio: 5
Triglycerides: 217 mg/dL — ABNORMAL HIGH (ref 0.0–149.0)
VLDL: 43.4 mg/dL — ABNORMAL HIGH (ref 0.0–40.0)

## 2024-05-18 LAB — HEMOGLOBIN A1C: Hgb A1c MFr Bld: 7.5 % — ABNORMAL HIGH (ref 4.6–6.5)

## 2024-05-19 ENCOUNTER — Ambulatory Visit: Payer: Self-pay | Admitting: Family Medicine

## 2024-05-26 ENCOUNTER — Other Ambulatory Visit (HOSPITAL_COMMUNITY): Payer: Self-pay

## 2024-05-26 ENCOUNTER — Ambulatory Visit: Admitting: Family Medicine

## 2024-05-26 ENCOUNTER — Encounter: Payer: Self-pay | Admitting: Family Medicine

## 2024-05-26 ENCOUNTER — Telehealth: Payer: Self-pay

## 2024-05-26 ENCOUNTER — Other Ambulatory Visit: Payer: Self-pay

## 2024-05-26 VITALS — BP 142/82 | HR 82 | Temp 98.6°F | Ht 66.0 in | Wt 277.2 lb

## 2024-05-26 DIAGNOSIS — R059 Cough, unspecified: Secondary | ICD-10-CM | POA: Diagnosis not present

## 2024-05-26 DIAGNOSIS — E119 Type 2 diabetes mellitus without complications: Secondary | ICD-10-CM

## 2024-05-26 DIAGNOSIS — E782 Mixed hyperlipidemia: Secondary | ICD-10-CM | POA: Diagnosis not present

## 2024-05-26 DIAGNOSIS — Z7984 Long term (current) use of oral hypoglycemic drugs: Secondary | ICD-10-CM

## 2024-05-26 MED ORDER — TRULICITY 0.75 MG/0.5ML ~~LOC~~ SOAJ
0.7500 mg | SUBCUTANEOUS | 1 refills | Status: DC
  Filled 2024-05-26 – 2024-05-31 (×4): qty 2, 28d supply, fill #0

## 2024-05-26 MED ORDER — DOXYCYCLINE HYCLATE 100 MG PO TABS: 100.0000 mg | ORAL_TABLET | Freq: Two times a day (BID) | ORAL | 0 refills | Status: DC

## 2024-05-26 MED ORDER — METFORMIN HCL ER 500 MG PO TB24: 500.0000 mg | ORAL_TABLET | Freq: Two times a day (BID) | ORAL | Status: DC

## 2024-05-26 MED ORDER — TRULICITY 1.5 MG/0.5ML ~~LOC~~ SOAJ
1.5000 mg | SUBCUTANEOUS | 3 refills | Status: DC
  Filled 2024-05-26 – 2024-06-28 (×3): qty 2, 28d supply, fill #0
  Filled 2024-07-26: qty 2, 28d supply, fill #1
  Filled 2024-08-15 – 2024-08-22 (×2): qty 2, 28d supply, fill #2

## 2024-05-26 NOTE — Telephone Encounter (Signed)
 Error

## 2024-05-26 NOTE — Patient Instructions (Addendum)
 If the cough/sputum is worse, then start doxy.   When feeling better, try trulicity 0.75mg  weekly.  If tolerated, then increase to 1.5mg  weekly.  Update me as needed.  Recheck in about 3 months with A1c at the visit.  Take care.  Glad to see you.

## 2024-05-26 NOTE — Progress Notes (Signed)
 Diabetes:  Using medications without difficulties: she can tolerate XR metformin  as is.  Hypoglycemic episodes: not checked. No sx Hyperglycemic episodes: not checked.  No sx Feet problems:  h/o neuropathy at baseline.  Blood Sugars averaging: not checked.  eye exam within last year: this summer- saw [facility] clinic.  Lipids improved.  A1c higher.  D/w pt about labs.  Had restarted gemfibrozil  in the meantime.   Cough started a few days ago, ie worse in the last few days.  She had longstanding cough prior to that.  Still on advair at baseline.  No CP with exertion.  Some sputum in the AM, this is new.  Yellow sputum.  No fever.  No wheeze.  SABA helps some.    She can get a flu shot when feeling better.    Meds, vitals, and allergies reviewed.   ROS: Per HPI unless specifically indicated in ROS section   GEN: nad, alert and oriented HEENT: mucous membranes moist, TM wnl, OP wnl.  NECK: supple w/o LA CV: rrr. PULM: ctab, no inc wob ABD: soft, +bs EXT: no edema SKIN: well perfused   Diabetic foot exam: Normal inspection except for scrape on the L 2nd toe, healing No skin breakdown No calluses  Normal DP pulses Normal sensation to light touch and monofilament Nails normal  33 minutes were devoted to patient care in this encounter (this includes time spent reviewing the patient's file/history, interviewing and examining the patient, counseling/reviewing plan with patient).

## 2024-05-26 NOTE — Telephone Encounter (Signed)
 Pharmacy Patient Advocate Encounter   Received notification from Onbase that prior authorization for TRULICITY 0.75 MG/0.5 ML PEN is required/requested.   Insurance verification completed.   The patient is insured through [employer].   Per test claim: PA required; PA submitted to above mentioned insurance via Prompt PA Key/confirmation #/EOC [id number] Status is pending

## 2024-05-27 ENCOUNTER — Other Ambulatory Visit: Payer: Self-pay

## 2024-05-27 ENCOUNTER — Telehealth: Payer: Self-pay

## 2024-05-27 NOTE — Telephone Encounter (Signed)
 Called [facility] and requested notes from last eye exam. Per Selinda, office representative eye exam was June 2025.  They will fax over.

## 2024-05-27 NOTE — Telephone Encounter (Signed)
-----   Message from Arlyss Solian sent at 05/26/2024  8:24 AM EDT ----- Please request eye report.   [facility].  [address] [place], [place] [address] [phone]

## 2024-05-29 NOTE — Assessment & Plan Note (Signed)
 Discussed options.  Lungs are still clear.  Hold doxycycline  for now.  She can start if she has progressive sputum production or does not spontaneously improve with supportive care.  Okay for outpatient follow-up.  She agrees to plan.

## 2024-05-29 NOTE — Assessment & Plan Note (Signed)
 Lipids improved.  Had restarted gemfibrozil  in the meantime.  Continue work on diet and exercise.  We can recheck periodically.

## 2024-05-29 NOTE — Assessment & Plan Note (Addendum)
 A1c higher.  D/w pt about labs.   When feeling better, try trulicity 0.75mg  weekly.  If tolerated, then increase to 1.5mg  weekly.  Routine medication cautions discussed with patient, especially regarding abdominal pain, potential GI symptoms, etc. Update me as needed.  Recheck in about 3 months with A1c at the visit.

## 2024-05-30 ENCOUNTER — Other Ambulatory Visit (HOSPITAL_COMMUNITY): Payer: Self-pay

## 2024-05-31 ENCOUNTER — Other Ambulatory Visit: Payer: Self-pay

## 2024-06-13 ENCOUNTER — Encounter: Payer: Self-pay | Admitting: Registered Nurse

## 2024-06-13 ENCOUNTER — Telehealth: Payer: Self-pay | Admitting: Registered Nurse

## 2024-06-13 DIAGNOSIS — R300 Dysuria: Secondary | ICD-10-CM

## 2024-06-13 DIAGNOSIS — R10A0 Flank pain, unspecified side: Secondary | ICD-10-CM

## 2024-06-13 MED ORDER — PHENAZOPYRIDINE HCL 200 MG PO TABS: 200.0000 mg | ORAL_TABLET | Freq: Three times a day (TID) | ORAL | 0 refills | Status: DC

## 2024-06-13 NOTE — Telephone Encounter (Signed)
 [name] I was going to try and wait and see you Tues morning but I really need to get started on some medication as soon as I can if you say it is okay.   I have a UTI infection, I noticed Thurs my urine a little darker color than normal and I burnt a little when I went to the bathroom so I started drinking more water  - well by Sunday I knew - I am hurting when I go pee if I can pee I feel like I am about to bust but when I go it hurts and only  trickles out, much darker in color too.    Can you call me in something to [facility] [phone] at [address] - [address] of  [address] -  so I can get on it to get rid of this please?  Discussed with patient to see RN Karene to give urine sample for dipstick and urine culture if dipstick abnormal  Increase water  intake avoid dehydration and if tea/cola colored urine, unable to urinate every 8 hours, worsening abdomen/back pain and/or fever/chills I recommend evaluation by a provider sooner

## 2024-06-14 ENCOUNTER — Other Ambulatory Visit: Payer: Self-pay | Admitting: General Practice

## 2024-06-14 ENCOUNTER — Other Ambulatory Visit: Payer: Self-pay | Admitting: Registered Nurse

## 2024-06-14 ENCOUNTER — Ambulatory Visit: Payer: Self-pay | Admitting: General Practice

## 2024-06-14 ENCOUNTER — Ambulatory Visit: Payer: Self-pay | Admitting: Registered Nurse

## 2024-06-14 DIAGNOSIS — R300 Dysuria: Secondary | ICD-10-CM

## 2024-06-14 DIAGNOSIS — R350 Frequency of micturition: Secondary | ICD-10-CM | POA: Insufficient documentation

## 2024-06-14 DIAGNOSIS — N3001 Acute cystitis with hematuria: Secondary | ICD-10-CM

## 2024-06-14 LAB — POCT URINALYSIS DIPSTICK

## 2024-06-14 MED ORDER — FLUCONAZOLE 150 MG PO TABS: ORAL_TABLET | ORAL | 0 refills | Status: DC

## 2024-06-14 MED ORDER — AMOXICILLIN-POT CLAVULANATE 875-125 MG PO TABS: 1.0000 | ORAL_TABLET | Freq: Two times a day (BID) | ORAL | 0 refills | Status: DC

## 2024-06-14 NOTE — Progress Notes (Unsigned)
 Employee presents to the clinic to provide urine sample. See NP notes on urine dipstick results. Urine cx will be sent to [facility] this afternoon.

## 2024-06-14 NOTE — Telephone Encounter (Signed)
 Dipstick urinalysis positive leukocytes, nitrates, blood, urobilirubin ph 6.5 had used pyridium today SG 1.030  Patient asked to come to clinic to discuss antibiotic choices no history of urine culture on file  Sample sent to [facility] today for urine culture.  Patient preferred to take augmentin .  Discussed 5 to 7 days use.  History of vaginal candidiasis with oral antibiotics diflucan  150mg  po x 1 now and may repeat once in 72 hours #2 RF0 sent electronically to her pharmacy of choice.  Temperature 97.90F tympanic; headache left frontal; right flank CVA tenderness denied changes in hand/feet swelling chronic  Dispensed 20 tabs augmentin  875mg  po BID #20 RF0 from [facility] to patient and stated to take first dose now.  Discussed dipstick results with patient positive for UTI.  Discussed as hormone levels change with menopause women at increased risk for UTI due to thinning of skin and short urethra.  Patient stated has not been drinking as much water  when caring for her parents the last 2-3 days or going to the bathroom.  Headache today and right back pain.  Still has burning with urination.  Patient verbalized understanding information/instructions, agreed with plan of care and had no further questions at this time.

## 2024-06-14 NOTE — Progress Notes (Unsigned)
   Established Patient Office Visit  Subjective   Patient ID: [name], female    DOB: [birth year]  Age: 63 y.o. MRN: [id number]  No chief complaint on file.   HPI  {History (Optional):23778}  ROS    Objective:     There were no vitals taken for this visit. {Vitals History (Optional):23777}  Physical Exam   No results found for any visits on 06/14/24.  {Labs (Optional):23779}  The 10-year ASCVD risk score (Arnett DK, et al., 2019) is: 13.2%    Assessment & Plan:   Problem List Items Addressed This Visit     Urine frequency - Primary    No follow-ups on file.    Niko Penson C, RN

## 2024-06-15 LAB — URINALYSIS, ROUTINE W REFLEX MICROSCOPIC

## 2024-06-16 ENCOUNTER — Ambulatory Visit: Payer: Self-pay | Admitting: Registered Nurse

## 2024-06-16 ENCOUNTER — Encounter: Payer: Self-pay | Admitting: Registered Nurse

## 2024-06-16 VITALS — Temp 97.7°F

## 2024-06-16 DIAGNOSIS — N390 Urinary tract infection, site not specified: Secondary | ICD-10-CM

## 2024-06-16 LAB — POCT URINALYSIS DIPSTICK
Bilirubin, UA: NEGATIVE
Glucose, UA: POSITIVE — AB
Protein, UA: POSITIVE — AB
Spec Grav, UA: 1.015 (ref 1.010–1.025)
Urobilinogen, UA: 0.2 U/dL
pH, UA: 5 (ref 5.0–8.0)

## 2024-06-16 LAB — URINE CULTURE

## 2024-06-16 LAB — URINALYSIS, ROUTINE W REFLEX MICROSCOPIC

## 2024-06-16 MED ORDER — NITROFURANTOIN MONOHYD MACRO 100 MG PO CAPS: 100.0000 mg | ORAL_CAPSULE | Freq: Two times a day (BID) | ORAL | 0 refills | Status: AC

## 2024-06-16 NOTE — Patient Instructions (Signed)

## 2024-06-16 NOTE — Telephone Encounter (Signed)
 Patient stated back pain worsening and dysuria not really improving.  Instructed to come to clinic for appt today and repeat urinalysis.  Urine culture results still pending preliminary gram negative rods 50-100K per [facility] still awaiting drug sensitivity and identification results.  See office note dated 06/16/24

## 2024-06-16 NOTE — Progress Notes (Signed)
 Established Patient Office Visit  Subjective   Patient ID: [name], female    DOB: [birth year]  Age: 63 y.o. MRN: [id number]  Chief Complaint  Patient presents with   Back Pain    worsening    63y/o caucasian female established female here for re-evaluation dysuria was started on augmentin  875mg  BID 06/14/24 for dipstick urinalysis positive leukocytes and nitrates.  Urine culture results still pending from [facility]  denied fever/chills/n/v/d/headache/hand/feet swelling.  Right flank/back pain worsening.  Has been trying to increase her water  intake.  Urinating frequently denied gross hematuria.        Review of Systems  Constitutional:  Negative for chills, diaphoresis, fever and malaise/fatigue.  Cardiovascular:  Negative for leg swelling.  Gastrointestinal:  Negative for abdominal pain, diarrhea, nausea and vomiting.  Genitourinary:  Positive for dysuria, flank pain and frequency. Negative for hematuria.  Musculoskeletal:  Positive for back pain. Negative for neck pain.  Skin:  Negative for itching and rash.  Neurological:  Negative for dizziness and headaches.  Psychiatric/Behavioral:  The patient does not have insomnia.       Objective:     Temp 97.7 F (36.5 C) (Tympanic)    Physical Exam Vitals and nursing note reviewed.  Constitutional:      General: She is awake. She is not in acute distress.    Appearance: Normal appearance. She is well-developed and well-groomed. She is obese. She is not ill-appearing, toxic-appearing or diaphoretic.  HENT:     Head: Normocephalic and atraumatic.     Jaw: There is normal jaw occlusion.     Salivary Glands: Right salivary gland is not diffusely enlarged. Left salivary gland is not diffusely enlarged.     Right Ear: Hearing and external ear normal.     Left Ear: Hearing and external ear normal.     Nose: Nose normal. No congestion or rhinorrhea.     Mouth/Throat:     Lips: Pink. No lesions.     Mouth: Mucous membranes  are moist.     Pharynx: Oropharynx is clear.  Eyes:     General: Lids are normal. Vision grossly intact. Gaze aligned appropriately. No scleral icterus.       Right eye: No discharge.        Left eye: No discharge.     Extraocular Movements: Extraocular movements intact.     Conjunctiva/sclera: Conjunctivae normal.     Pupils: Pupils are equal, round, and reactive to light.  Neck:     Trachea: Trachea normal.  Cardiovascular:     Rate and Rhythm: Normal rate and regular rhythm.     Pulses: Normal pulses.  Pulmonary:     Effort: Pulmonary effort is normal. No respiratory distress.     Breath sounds: Normal breath sounds and air entry. No stridor or transmitted upper airway sounds. No wheezing.     Comments: Spoke full sentences without difficulty; no cough observed in exam room Abdominal:     General: Abdomen is flat. There is no distension.     Palpations: Abdomen is soft.     Tenderness: There is right CVA tenderness. There is no left CVA tenderness or guarding.     Comments: Soft abdomen; right CVA tenderness with percussion unchanged from Tuesday 10/28  Musculoskeletal:        General: Normal range of motion.     Cervical back: Normal range of motion and neck supple. No rigidity or tenderness.     Right lower leg: No edema.  Left lower leg: No edema.  Lymphadenopathy:     Head:     Right side of head: No submandibular or preauricular adenopathy.     Left side of head: No submandibular or preauricular adenopathy.     Cervical: No cervical adenopathy.     Right cervical: No superficial cervical adenopathy.    Left cervical: No superficial cervical adenopathy.  Skin:    General: Skin is warm and dry.     Capillary Refill: Capillary refill takes less than 2 seconds.     Coloration: Skin is not ashen, cyanotic, jaundiced, mottled, pale or sallow.     Findings: No abrasion, abscess, acne, bruising, burn, ecchymosis, erythema, signs of injury, laceration, lesion, petechiae, rash  or wound.  Neurological:     General: No focal deficit present.     Mental Status: She is alert and oriented to person, place, and time. Mental status is at baseline.     GCS: GCS eye subscore is 4. GCS verbal subscore is 5. GCS motor subscore is 6.     Cranial Nerves: No cranial nerve deficit, dysarthria or facial asymmetry.     Motor: Motor function is intact. No weakness, tremor, atrophy, abnormal muscle tone or seizure activity.     Coordination: Coordination is intact. Coordination normal.     Gait: Gait is intact. Gait normal.     Comments: In/out of chair without difficulty; gait sure and steady in clinic; bilateral hand grasp equal 5/5  Psychiatric:        Attention and Perception: Attention and perception normal.        Mood and Affect: Mood and affect normal.        Speech: Speech normal.        Behavior: Behavior normal. Behavior is cooperative.        Thought Content: Thought content normal.        Cognition and Memory: Cognition and memory normal.        Judgment: Judgment normal.      Results for orders placed or performed in visit on 06/16/24  POCT Urinalysis Dipstick (CPT 81002)  Result Value Ref Range   Color, UA yellow    Clarity, UA clear    Glucose, UA Positive (A) Negative   Bilirubin, UA negative    Ketones, UA trace    Spec Grav, UA 1.015 1.010 - 1.025   Blood, UA trace    pH, UA 5.0 5.0 - 8.0   Protein, UA Positive (A) Negative   Urobilinogen, UA 0.2 0.2 or 1.0 E.U./dL   Nitrite, UA trace    Leukocytes, UA Small (1+) (A) Negative   Appearance clear    Odor none       The 10-year ASCVD risk score (Arnett DK, et al., 2019) is: 13.2%    Assessment & Plan:   Problem List Items Addressed This Visit   None Visit Diagnoses       Urinary tract infection with hematuria, site unspecified    -  Primary   Relevant Medications   nitrofurantoin, macrocrystal-monohydrate, (MACROBID) 100 MG capsule   Other Relevant Orders   POCT Urinalysis Dipstick (CPT  81002) (Completed)     Urine culture preliminary gram negative rods 50-100K per [facility] drug sensitivity still pending  Will change from augmentin  to nitrofurantoin due to worsening flank pain and dipstick urinalysis repeat x 1 now  still positive nitrates/leukocytes/protein/blood/ketones/glucose  discussed results with patient in clinic Electronic rx to her pharmacy of choice start nitrofurantoin 100mg   po BID x 7 days stop augmentin   increase water  intake.  Stopped pyridium after 2 days. Afebrile  exitcare handout on UTI  Will call her this weekend if resistance noted on urine culture to nitrofurantoin.  Discussed that sample was not cloudy but it was less than 10ml in cup today.  Patient had urinated prior to clinic notifying her new sample needed.   Discussed urine still concentrated increase water . ER if gross hematuria this weekend, repetitive n/v, worsening back/abdomen pain, fever or chills.  Patient agreed with plan of care and had no further questions at this time.  Return if symptoms worsen or fail to improve.    Ellouise DELENA Hope, NP

## 2024-06-17 ENCOUNTER — Ambulatory Visit: Payer: Self-pay | Admitting: Registered Nurse

## 2024-06-17 DIAGNOSIS — N390 Urinary tract infection, site not specified: Secondary | ICD-10-CM

## 2024-06-17 NOTE — Telephone Encounter (Signed)
 My chart message sent to patient antibiotic resistance was noted to amoxicillin  per [facility] this am.  Continue nitrofurantoin 100mg  po BID that was prescribed yesterday as it showed to be effective/no resistance and should be feeling better after 4 doses/48 hours of starting new medication.  Notify me if you are not feeling better.  See results note sent to patient.

## 2024-06-22 ENCOUNTER — Other Ambulatory Visit: Payer: Self-pay

## 2024-06-22 ENCOUNTER — Other Ambulatory Visit (HOSPITAL_COMMUNITY): Payer: Self-pay

## 2024-06-22 ENCOUNTER — Telehealth: Payer: Self-pay | Admitting: Pharmacy Technician

## 2024-06-22 NOTE — Telephone Encounter (Signed)
 Pharmacy Patient Advocate Encounter  Received notification from [employer] that Prior Authorization for TRULICITY 1.5 MG/0.5 ML PEN has been APPROVED from 06/22/24 to 06/21/25. Ran test claim, Copay is $25.00. This test claim was processed through [facility]- copay amounts may vary at other pharmacies due to pharmacy/plan contracts, or as the patient moves through the different stages of their insurance plan.   PA #/Case ID/Reference #: [id number]

## 2024-06-22 NOTE — Telephone Encounter (Signed)
 Pharmacy Patient Advocate Encounter   Received notification from [employer] that prior authorization for Trulicity 1.5MG /0.5ML auto-injectors  is required/requested.   Insurance verification completed.   The patient is insured through [employer].   Per test claim: PA required; PA submitted to above mentioned insurance via Prompt PA Key/confirmation #/EOC [id number] Status is pending

## 2024-06-22 NOTE — Telephone Encounter (Signed)
 Patient reported symptoms improving but still having back pain mild at this time.  Taking nitrofurantoin 100mg  po BID.  Continue hydrating with water  and taking medication as prescribed.  Discussed with patient if symptoms have not resolved completely tomorrow to come to clinic and give another urine sample for dipstick urinalysis.  Patient agreed with plan of care and had no further questions at this time.

## 2024-06-23 ENCOUNTER — Other Ambulatory Visit (HOSPITAL_COMMUNITY): Payer: Self-pay

## 2024-06-28 ENCOUNTER — Other Ambulatory Visit: Payer: Self-pay

## 2024-06-28 NOTE — Telephone Encounter (Signed)
 Not available at desk today 2pm

## 2024-07-05 ENCOUNTER — Ambulatory Visit: Payer: Self-pay | Admitting: Registered Nurse

## 2024-07-05 ENCOUNTER — Encounter: Payer: Self-pay | Admitting: Registered Nurse

## 2024-07-05 DIAGNOSIS — Z87440 Personal history of urinary (tract) infections: Secondary | ICD-10-CM

## 2024-07-05 DIAGNOSIS — Z87898 Personal history of other specified conditions: Secondary | ICD-10-CM

## 2024-07-05 LAB — POCT URINALYSIS DIPSTICK
Bilirubin, UA: NEGATIVE
Blood, UA: NEGATIVE
Glucose, UA: NEGATIVE
Ketones, UA: NEGATIVE
Leukocytes, UA: NEGATIVE
Nitrite, UA: NEGATIVE
Protein, UA: NEGATIVE
Spec Grav, UA: 1.025 (ref 1.010–1.025)
Urobilinogen, UA: 0.2 U/dL
pH, UA: 6 (ref 5.0–8.0)

## 2024-07-05 NOTE — Progress Notes (Signed)
 Employee presents to the clinic to retest urine with a dipstick test, see results.

## 2024-07-05 NOTE — Progress Notes (Signed)
 Established Patient Office Visit  Subjective   Patient ID: [name], female    DOB: 1961/01/18  Age: 63 y.o. MRN: [id number]  Chief Complaint  Patient presents with   follow up UTI    Back pain just resolved    63y/o caucasian female established patient last seen 06/16/24 treated for UTI had antibiotic resistance required treatment change.  Patient had back pain after treatment completed which has now resolved requested to redipstick test as has had recurrent UTIs this year.  Denied fever/chills/n/v/d/back/abdomen pain/hand/feet swelling this week      Review of Systems  Constitutional:  Negative for chills and fever.  Gastrointestinal:  Negative for abdominal pain, constipation, diarrhea, nausea and vomiting.  Genitourinary:  Negative for dysuria, flank pain, frequency, hematuria and urgency.  Neurological:  Negative for headaches.      Objective:     There were no vitals taken for this visit.   Physical Exam Vitals and nursing note reviewed.  Constitutional:      General: She is awake. She is not in acute distress.    Appearance: Normal appearance. She is well-developed and well-groomed. She is not ill-appearing, toxic-appearing or diaphoretic.  HENT:     Head: Normocephalic and atraumatic.     Jaw: There is normal jaw occlusion.     Salivary Glands: Right salivary gland is not diffusely enlarged. Left salivary gland is not diffusely enlarged.     Right Ear: Hearing and external ear normal.     Left Ear: Hearing and external ear normal.     Nose: Nose normal. No congestion or rhinorrhea.     Mouth/Throat:     Lips: Pink. No lesions.     Mouth: Mucous membranes are moist.     Pharynx: Oropharynx is clear.  Eyes:     General: Lids are normal. Vision grossly intact. Gaze aligned appropriately. Allergic shiner present. No scleral icterus.       Right eye: No discharge.        Left eye: No discharge.     Extraocular Movements: Extraocular movements intact.      Conjunctiva/sclera: Conjunctivae normal.     Pupils: Pupils are equal, round, and reactive to light.  Neck:     Trachea: Trachea and phonation normal.  Cardiovascular:     Rate and Rhythm: Normal rate and regular rhythm.  Pulmonary:     Effort: Pulmonary effort is normal. No respiratory distress.     Breath sounds: Normal breath sounds and air entry. No stridor or transmitted upper airway sounds. No wheezing.     Comments: Spoke full sentences without difficulty; no cough observed in exam room Abdominal:     Palpations: Abdomen is soft.  Musculoskeletal:        General: Normal range of motion.     Cervical back: Normal range of motion and neck supple. No edema, erythema, signs of trauma, rigidity, torticollis or crepitus.     Right lower leg: No edema.     Left lower leg: No edema.  Lymphadenopathy:     Head:     Right side of head: No submandibular or preauricular adenopathy.     Left side of head: No submandibular or preauricular adenopathy.     Cervical:     Right cervical: No superficial cervical adenopathy.    Left cervical: No superficial cervical adenopathy.  Skin:    General: Skin is warm and dry.     Capillary Refill: Capillary refill takes less than 2 seconds.  Coloration: Skin is not ashen, cyanotic, jaundiced, mottled, pale or sallow.     Findings: No abrasion, abscess, acne, bruising, burn, ecchymosis, erythema, signs of injury, laceration, lesion, petechiae, rash or wound.     Comments: Anterior face/neck/hands visually inspected  Neurological:     General: No focal deficit present.     Mental Status: She is alert and oriented to person, place, and time. Mental status is at baseline.     GCS: GCS eye subscore is 4. GCS verbal subscore is 5. GCS motor subscore is 6.     Cranial Nerves: Cranial nerves 2-12 are intact. No cranial nerve deficit, dysarthria or facial asymmetry.     Motor: Motor function is intact. No weakness, tremor, atrophy, abnormal muscle tone or  seizure activity.     Coordination: Coordination is intact. Coordination normal.     Gait: Gait is intact. Gait normal.     Comments: In/out of chair without difficulty; gait sure and steady in clinic; bilateral hand grasp equal 5/5  Psychiatric:        Attention and Perception: Attention and perception normal.        Mood and Affect: Mood and affect normal.        Speech: Speech normal.        Behavior: Behavior normal. Behavior is cooperative.        Thought Content: Thought content normal.        Cognition and Memory: Cognition and memory normal.        Judgment: Judgment normal.      Results for orders placed or performed in visit on 07/05/24  POCT Urinalysis Dipstick (CPT 81002)  Result Value Ref Range   Color, UA YELLOW    Clarity, UA CLEAR    Glucose, UA Negative Negative   Bilirubin, UA NEG    Ketones, UA NEG    Spec Grav, UA 1.025 1.010 - 1.025   Blood, UA NEG    pH, UA 6.0 5.0 - 8.0   Protein, UA Negative Negative   Urobilinogen, UA 0.2 0.2 or 1.0 E.U./dL   Nitrite, UA NEG    Leukocytes, UA Negative Negative   Appearance CLEAR    Odor NONE   Patient notified of normal results via my chart; read receipt received no further questions from patient stated Thank you!    The 10-year ASCVD risk score (Arnett DK, et al., 2019) is: 13.2%    Assessment & Plan:   Problem List Items Addressed This Visit   None Visit Diagnoses       History of UTI    -  Primary     History of flank pain         POCT dipstick urinalysis x 1 now  Continue water  intake to keep urine pale yellow clear and voiding every 2-4 hours when awake.  Follow up for re-evaluation if recurring flank/back/abdomen pain, urinary frequency, dysuria, fever/chills/n/v and/or hand/feet swelling.  Discussed follow up with PCM to discuss if topical hormones urethra to be started if recurrent infections continue.  Discussed hormones cannot be prescribed in this clinic due to contract limitations.  Patient agreed  with plan of care and had no further questions at this time.  Return if symptoms worsen or fail to improve.    Ellouise DELENA Hope, NP

## 2024-07-06 NOTE — Telephone Encounter (Signed)
 Patient seen in clinic 07/05/24 dipstick urinalysis normal  and symptoms resolved.

## 2024-07-12 ENCOUNTER — Telehealth: Payer: Self-pay | Admitting: Registered Nurse

## 2024-07-12 ENCOUNTER — Encounter: Payer: Self-pay | Admitting: Registered Nurse

## 2024-07-12 DIAGNOSIS — E785 Hyperlipidemia, unspecified: Secondary | ICD-10-CM

## 2024-07-12 DIAGNOSIS — K219 Gastro-esophageal reflux disease without esophagitis: Secondary | ICD-10-CM

## 2024-07-12 DIAGNOSIS — I10 Essential (primary) hypertension: Secondary | ICD-10-CM

## 2024-07-12 MED ORDER — GEMFIBROZIL 600 MG PO TABS: 600.0000 mg | ORAL_TABLET | Freq: Two times a day (BID) | ORAL | 2 refills | Status: DC

## 2024-07-12 MED ORDER — OMEPRAZOLE 40 MG PO CPDR: 40.0000 mg | DELAYED_RELEASE_CAPSULE | Freq: Every day | ORAL | 2 refills | Status: DC

## 2024-07-12 MED ORDER — HYDROCHLOROTHIAZIDE 12.5 MG PO TABS: 12.5000 mg | ORAL_TABLET | Freq: Every day | ORAL | 2 refills | Status: DC

## 2024-07-12 NOTE — Telephone Encounter (Signed)
 Patient treated for UTI symptoms resolved and seen in clinic 07/05/24 dipstick urinalysis normal

## 2024-07-12 NOTE — Telephone Encounter (Signed)
 Patient notified NP had appt with [facility] 05/26/24   Last filled hydrochlorothiazide  12.5mg  #90 11/17/2023 via [facility]  and omeprazole  DR 40mg  po daily #90.  Gemfibrozil  600mg  po BID #180 02/23/2024  PCM did labs LDL unable to calculate ratio total to HDL 5.2  Hgba1c 7.1 and BP  130/80  Patient had not recently taking gemfibrozil  and restarting use.  Hgba1c recheck in 3 months with PCM.  Alternative paperwork completed by NP and patient and [facility] form completed and given to HR Jen met requirements for insurance discount starting 18 May 2024 02/23/24  Dispensed 90 capsules omeprazole  DR 40mg  and Hydrochlorothiazide  12.5mg  daily 90 tabs and  gemfibrozil  600mg  po BID #180 to patient today from [facility]   Latest Reference Range & Units 05/18/24 07:28  Total CHOL/HDL Ratio  5  Cholesterol 0 - 200 mg/dL 841  HDL Cholesterol >60.99 mg/dL 67.69 (L)  LDL (calc) 0 - 99 mg/dL 82  NonHDL  874.10  Triglycerides 0.0 - 149.0 mg/dL 782.9 (H)  VLDL 0.0 - 59.9 mg/dL 56.5 (H)  Hemoglobin J8R 4.6 - 6.5 % 7.5 (H)  (L): Data is abnormally low (H): Data is abnormally high     Latest Reference Range & Units 01/29/24 07:32  Comprehensive metabolic panel with GFR   Rpt !  Sodium 135 - 146 mmol/L 137  Potassium 3.5 - 5.3 mmol/L 4.2  Chloride 98 - 110 mmol/L 100  CO2 20 - 32 mmol/L 24  Glucose 65 - 99 mg/dL 854 (H)  Mean Plasma Glucose mg/dL 842  BUN 7 - 25 mg/dL 10  Creatinine 9.49 - 8.94 mg/dL 9.26  Calcium 8.6 - 89.5 mg/dL 9.2  BUN/Creatinine Ratio 6 - 22 (calc) SEE NOTE:  eGFR > OR = 60 mL/min/1.76m2 94  AG Ratio 1.0 - 2.5 (calc) 1.5  AST 10 - 35 U/L 22  ALT 6 - 29 U/L 23  Total Protein 6.1 - 8.1 g/dL 6.9  Total Bilirubin 0.2 - 1.2 mg/dL 0.4  Total CHOL/HDL Ratio <5.0 (calc) 5.2 (H)  Cholesterol <200 mg/dL 796 (H)  HDL Cholesterol > OR = 50 mg/dL 39 (L)  LDL Cholesterol (Calc) mg/dL (calc) Pend  MICROALB/CREAT RATIO <30 mg/g creat 11  Non-HDL Cholesterol (Calc) <130 mg/dL (calc) 835 (H)  Triglycerides <150 mg/dL 546 (H)   Alkaline phosphatase (APISO) 37 - 153 U/L 85  Vitamin D , 25-Hydroxy 30 - 100 ng/mL 45  Globulin 1.9 - 3.7 g/dL (calc) 2.8  WBC 3.8 - 89.1 Thousand/uL 9.4  RBC 3.80 - 5.10 Million/uL 4.23  Hemoglobin 11.7 - 15.5 g/dL 87.3  HCT 64.9 - 54.9 % 39.0  MCV 80.0 - 100.0 fL 92.2  MCH 27.0 - 33.0 pg 29.8  MCHC 32.0 - 36.0 g/dL 67.6  RDW 88.9 - 84.9 % 14.3  Platelets 140 - 400 Thousand/uL 278  MPV 7.5 - 12.5 fL 10.4  Neutrophils % 68.5  Monocytes Relative % 7.5  Eosinophil % 2.3  Basophil % 0.4  NEUT# 1,500 - 7,800 cells/uL 6,439  Total Lymphocyte % 21.3  Eosinophils Absolute 15 - 500 cells/uL 216  Basophils Absolute 0 - 200 cells/uL 38  Absolute Monocytes 200 - 950 cells/uL 705  eAG (mmol/L) mmol/L 8.7  Hemoglobin A1C <5.7 % 7.1 (H)  TSH 0.40 - 4.50 mIU/L 2.99  !: Data is abnormal (H): Data is abnormally high (L): Data is abnormally low Rpt: View report in Results Review for more information

## 2024-07-12 NOTE — Telephone Encounter (Signed)
 Patient seen clinic 07/05/24 had follow up dipstick urinalysis after treatment for e coli and results were normal

## 2024-07-12 NOTE — Telephone Encounter (Signed)
 Patient seen in clinic 07/05/24 repeat dipstick urinalysis normal

## 2024-08-10 ENCOUNTER — Other Ambulatory Visit: Payer: Self-pay | Admitting: Family Medicine

## 2024-08-10 DIAGNOSIS — E119 Type 2 diabetes mellitus without complications: Secondary | ICD-10-CM

## 2024-08-15 ENCOUNTER — Other Ambulatory Visit: Payer: Self-pay

## 2024-08-15 NOTE — Telephone Encounter (Signed)
 Metformin  XR Last filled:  05/13/24 Last OV:  05/26/24, 3 mo DM f/u Next OV:  08/26/24, 3 mo DM f/u

## 2024-08-23 ENCOUNTER — Ambulatory Visit: Payer: Self-pay | Admitting: Registered Nurse

## 2024-08-23 VITALS — BP 156/86 | HR 76 | Temp 97.3°F | Resp 16

## 2024-08-23 DIAGNOSIS — J0111 Acute recurrent frontal sinusitis: Secondary | ICD-10-CM

## 2024-08-23 DIAGNOSIS — J209 Acute bronchitis, unspecified: Secondary | ICD-10-CM

## 2024-08-23 MED ORDER — PROMETHAZINE-DM 6.25-15 MG/5ML PO SYRP: 5.0000 mL | ORAL_SOLUTION | Freq: Four times a day (QID) | ORAL | 0 refills | Status: AC | PRN

## 2024-08-23 MED ORDER — ALBUTEROL SULFATE HFA 108 (90 BASE) MCG/ACT IN AERS: 1.0000 | INHALATION_SPRAY | RESPIRATORY_TRACT | 0 refills | Status: AC | PRN

## 2024-08-23 MED ORDER — DOXYCYCLINE HYCLATE 100 MG PO TABS: 100.0000 mg | ORAL_TABLET | Freq: Two times a day (BID) | ORAL | 0 refills | Status: AC

## 2024-08-23 MED ORDER — BENZONATATE 200 MG PO CAPS: 200.0000 mg | ORAL_CAPSULE | Freq: Three times a day (TID) | ORAL | 0 refills | Status: AC | PRN

## 2024-08-23 NOTE — Patient Instructions (Signed)
 Acute Bronchitis, Adult  Acute bronchitis is sudden inflammation of the main airways (bronchi) that come off the windpipe (trachea) in the lungs. The swelling causes the airways to get smaller and make more mucus than normal. This can make it hard to breathe and can cause coughing or noisy breathing (wheezing). Acute bronchitis may last several weeks. The cough may last longer. Allergies, asthma, and exposure to smoke may make the condition worse. What are the causes? This condition can be caused by germs and by substances that irritate the lungs, including: Cold and flu viruses. The most common cause of this condition is the virus that causes the common cold. Bacteria. This is less common. Breathing in substances that irritate the lungs, including: Smoke from cigarettes and other forms of tobacco. Dust and pollen. Fumes from household cleaning products, gases, or burned fuel. Indoor or outdoor air pollution. What increases the risk? The following factors may make you more likely to develop this condition: A weak body's defense system, also called the immune system. A condition that affects your lungs and breathing, such as asthma. What are the signs or symptoms? Common symptoms of this condition include: Coughing. This may bring up clear, yellow, or green mucus from your lungs (sputum). Wheezing. Runny or stuffy nose. Having too much mucus in your lungs (chest congestion). Shortness of breath. Aches and pains, including sore throat or chest. How is this diagnosed? This condition is usually diagnosed based on: Your symptoms and medical history. A physical exam. You may also have other tests, including tests to rule out other conditions, such as pneumonia. These tests include: A test of lung function. Test of a mucus sample to look for the presence of bacteria. Tests to check the oxygen level in your blood. Blood tests. Chest X-ray. How is this treated? Most cases of acute  bronchitis clear up over time without treatment. Your health care provider may recommend: Drinking more fluids to help thin your mucus so it is easier to cough up. Taking inhaled medicine (inhaler) to improve air flow in and out of your lungs. Using a vaporizer or a humidifier. These are machines that add water  to the air to help you breathe better. Taking a medicine that thins mucus and clears congestion (expectorant). Taking a medicine that prevents or stops coughing (cough suppressant). It is not common to take an antibiotic medicine for this condition. Follow these instructions at home:  Take over-the-counter and prescription medicines only as told by your health care provider. Use an inhaler, vaporizer, or humidifier as told by your health care provider. Take two teaspoons (10 mL) of honey at bedtime to lessen coughing at night. Drink enough fluid to keep your urine pale yellow. Do not use any products that contain nicotine or tobacco. These products include cigarettes, chewing tobacco, and vaping devices, such as e-cigarettes. If you need help quitting, ask your health care provider. Get plenty of rest. Return to your normal activities as told by your health care provider. Ask your health care provider what activities are safe for you. Keep all follow-up visits. This is important. How is this prevented? To lower your risk of getting this condition again: Wash your hands often with soap and water  for at least 20 seconds. If soap and water  are not available, use hand sanitizer. Avoid contact with people who have cold symptoms. Try not to touch your mouth, nose, or eyes with your hands. Avoid breathing in smoke or chemical fumes. Breathing smoke or chemical fumes will make  your condition worse. Get the flu shot every year. Contact a health care provider if: Your symptoms do not improve after 2 weeks. You have trouble coughing up the mucus. Your cough keeps you awake at night. You have  a fever. Get help right away if you: Cough up blood. Feel pain in your chest. Have severe shortness of breath. Faint or keep feeling like you are going to faint. Have a severe headache. Have a fever or chills that get worse. These symptoms may represent a serious problem that is an emergency. Do not wait to see if the symptoms will go away. Get medical help right away. Call your local emergency services (911 in the [place] Do not drive yourself to the hospital. Summary Acute bronchitis is inflammation of the main airways (bronchi) that come off the windpipe (trachea) in the lungs. The swelling causes the airways to get smaller and make more mucus than normal. Drinking more fluids can help thin your mucus so it is easier to cough up. Take over-the-counter and prescription medicines only as told by your health care provider. Do not use any products that contain nicotine or tobacco. These products include cigarettes, chewing tobacco, and vaping devices, such as e-cigarettes. If you need help quitting, ask your health care provider. Contact a health care provider if your symptoms do not improve after 2 weeks. This information is not intended to replace advice given to you by your health care provider. Make sure you discuss any questions you have with your health care provider. Document Revised: 11/14/2021 Document Reviewed: 12/05/2020 Else[employer] Patient Education  2024 [employer]How to Use a Metered Dose Inhaler  A metered dose inhaler (MDI) is a handheld device for taking medicine that must be breathed into the lungs (inhaled). The medicine comes in a canister that delivers a spray (puff) of medicine. Each canister holds a certain number of doses. A spacer (holding chamber) may be used to get more medicine into your lungs. What are the risks? If the medicine in the MDI is a steroid, it can cause mouth sores. To prevent this, rinse your mouth after you use the MDI. Gargle and spit out the water .  Do not swallow the water . If you do not use the MDI in the right way, the medicine may not reach your lungs. The medicine in the MDI may cause side effects. Read the package insert for the medicine to learn more. Ask your health care provider or pharmacist if you have questions. If you do not have enough strength to push down the canister to make it spray, ask your provider for ways to help. How to use an MDI without a spacer  Remove the cap from the MDI. If you are using the MDI for the first time, prepare (prime) it for use. Read the instructions for your inhaler or ask your provider about the number of puffs needed to prime your MDI. To prime your inhaler, shake it for 5 seconds, turn it away from your face, then send puffs into the air. Shake the MDI for 5 seconds. Position the inhaler so the top of the canister faces up. Put your pointer finger on the top of the canister. Support the bottom of the MDI with your thumb. Breathe out normally and as fully as you can, away from the MDI. Either place the MDI between your teeth and close your lips tightly around the mouthpiece or hold it 1-2 inches (2.5-5 cm) away from your open mouth. Keep your tongue down  out of the way. If you are unsure which technique to use, ask your provider. Press the canister down with your finger to release the medicine. Inhale deeply and slowly through your mouth until your lungs are filled. Do not breathe in through your nose. Inhaling should take 4-6 seconds. Hold the medicine in your lungs for 5-10 seconds. This helps it get into the small airways of your lungs. Remove the inhaler from your mouth. Turn your head and breathe out normally. Wait about 1 minute between puffs. Repeat steps 3-10 until you have taken the number of puffs that your provider told you to. Put the cap on the MDI. How to use an MDI with a spacer  Remove the cap from the MDI. If you are using the MDI for the first time, prime it for use. Read  the instructions for your inhaler or ask your provider about the number of puffs needed to prime your MDI. To prime your inhaler, shake it for 5 seconds, turn it away from your face, then send puffs into the air. Shake the MDI for 5 seconds. Put the open end of the spacer onto the mouthpiece. Position the inhaler so the top of the canister faces up and the spacer mouthpiece faces you. Put your pointer finger on the top of the canister. Support the bottom of the MDI and the spacer with your thumb. Breathe out normally and as fully as you can, away from the spacer. Place the spacer between your teeth. Close your lips tightly around it. Keep your tongue down out of the way. Press the canister down with your finger to release the medicine. Inhale deeply and slowly through your mouth until your lungs are filled. Do not breathe in through your nose. Hold the medicine in your lungs for 5-10 seconds. This helps it get into the small airways of your lungs. Remove the spacer from your mouth. Turn your head, and breathe out normally. Wait about 1 minute between puffs. Repeat steps 3-11 until you have taken the number of puffs that your provider told you to. Remove the spacer from the inhaler. Put the cap on the MDI. Follow these instructions at home: Caring for your MDI Refill your MDI with medicine before all the preset doses have been used. If your inhaler has a counter, check it to see how full your MDI is. The number you see tells you how many doses are left. If your inhaler does not have a counter, ask your provider when you will need to refill it. Write the refill date on a calendar or on your MDI canister. Keep in mind that you cannot tell when the medicine in an inhaler is empty by shaking it. You may feel or hear something in the canister even when the medicine has been used up. Store your MDI in a cool, dry place at room temperature. Follow instructions on the package insert for care and cleaning  of your MDI and spacer. General instructions Take your inhaled medicine only as told by your provider. Do not use the MDI more than you are told to. Do not use any products that contain nicotine or tobacco. These products include cigarettes, chewing tobacco, and vaping devices, such as e-cigarettes. If you need help quitting, ask your provider. Where to find more information Centers for Disease Control and Prevention (CDC): tonerpromos.no American Lung Association: lung.org Contact a health care provider if: Your symptom do not get better with the MDI. You have side effects from the  medicine. You are not sure how to use the MDI or your inhaler. Your inhaler is not working like it should. You have a cough that will not go away. You have a very sore mouth or throat and have trouble swallowing. Get help right away if: You have severe shortness of breath or trouble breathing. You have chest tightness or chest pain. You have an allergic reaction. Symptoms may include an itchy rash, swelling of the face or tongue, or trouble breathing. These symptoms may be an emergency. Get help right away. Call 911. Do not wait to see if the symptoms will go away. Do not drive yourself to the hospital. This information is not intended to replace advice given to you by your health care provider. Make sure you discuss any questions you have with your health care provider. Document Revised: 06/11/2022 Document Reviewed: 06/11/2022 Elsevier Patient Education  2024 [employer]How to Perform a Sinus Rinse A sinus rinse is a home treatment that is used to rinse your sinuses with a germ-free (sterile) mixture of salt and water  (saline solution). Sinuses are air-filled spaces in your skull that are behind the bones of your face and forehead. They open into your nasal cavity. A sinus rinse can help to clear mucus, dirt, dust, or pollen from your nasal cavity. You may do a sinus rinse when you have a cold, a virus, nasal  allergy symptoms, a sinus infection, or stuffiness in your nose or sinuses. What are the risks? A sinus rinse is generally safe and effective. However, there are a few risks, which include: A burning sensation in your sinuses. This may happen if you do not make the saline solution as directed. Be sure to follow all directions when making the saline solution. Nasal irritation. Infection. This may be from unclean supplies or from contaminated water . Infection from contaminated water  is rare, but possible. Do not do a sinus rinse if you have had ear or nasal surgery, ear infection, or plugged ears, unless recommended by your health care provider. Supplies needed: Saline solution or powder. Distilled or sterile water  to mix with saline powder. You may use boiled and cooled tap water . Boil tap water  for 5 minutes; cool until it is lukewarm. Use within 24 hours. Do not use regular tap water  to mix with the saline solution. Neti pot or nasal rinse bottle. These supplies release the saline solution into your nose and through your sinuses. Neti pots and nasal rinse bottles can be purchased at charity fund[employer], a health food store, or online. How to perform a sinus rinse  Wash your hands with soap and water  for at least 20 seconds. If soap and water  are not available, use hand sanitizer. Wash your device according to the directions that came with the product and then dry it. Use the solution that comes with your product or one that is sold separately in stores. Follow the mixing directions on the package to mix with sterile or distilled water . Fill the device with the amount of saline solution noted in the device instructions. Stand by a sink and tilt your head sideways over the sink. Place the spout of the device in your upper nostril (the one closer to the ceiling). Gently pour or squeeze the saline solution into your nasal cavity. The liquid should drain out from the lower nostril if you are not too  congested. While rinsing, breathe through your open mouth. Gently blow your nose to clear any mucus and rinse solution. Blowing too  hard may cause ear pain. Turn your head in the other direction and repeat in your other nostril. Clean and rinse your device with clean water  and then air-dry it. Talk with your health care provider or pharmacist if you have questions about how to do a sinus rinse. Summary A sinus rinse is a home treatment that is used to rinse your sinuses with a sterile mixture of salt and water  (saline solution). You may do a sinus rinse when you have a cold, a virus, nasal allergy symptoms, a sinus infection, or stuffiness in your nose or sinuses. A sinus rinse is generally safe and effective. Follow all instructions carefully. This information is not intended to replace advice given to you by your health care provider. Make sure you discuss any questions you have with your health care provider. Document Revised: 01/21/2021 Document Reviewed: 01/21/2021 Elsevier Patient Education  2024 [employer]Sinus Infection, Adult A sinus infection, also called sinusitis, is inflammation of your sinuses. Sinuses are hollow spaces in the bones around your face. Your sinuses are located: Around your eyes. In the middle of your forehead. Behind your nose. In your cheekbones. Mucus normally drains out of your sinuses. When your nasal tissues become inflamed or swollen, mucus can become trapped or blocked. This allows bacteria, viruses, and fungi to grow, which leads to infection. Most infections of the sinuses are caused by a virus. A sinus infection can develop quickly. It can last for up to 4 weeks (acute) or for more than 12 weeks (chronic). A sinus infection often develops after a cold. What are the causes? This condition is caused by anything that creates swelling in the sinuses or stops mucus from draining. This includes: Allergies. Asthma. Infection from bacteria or  viruses. Deformities or blockages in your nose or sinuses. Abnormal growths in the nose (nasal polyps). Pollutants, such as chemicals or irritants in the air. Infection from fungi. This is rare. What increases the risk? You are more likely to develop this condition if you: Have a weak body defense system (immune system). Do a lot of swimming or diving. Overuse nasal sprays. Smoke. What are the signs or symptoms? The main symptoms of this condition are pain and a feeling of pressure around the affected sinuses. Other symptoms include: Stuffy nose or congestion that makes it difficult to breathe through your nose. Thick yellow or greenish drainage from your nose. Tenderness, swelling, and warmth over the affected sinuses. A cough that may get worse at night. Decreased sense of smell and taste. Extra mucus that collects in the throat or the back of the nose (postnasal drip) causing a sore throat or bad breath. Tiredness (fatigue). Fever. How is this diagnosed? This condition is diagnosed based on: Your symptoms. Your medical history. A physical exam. Tests to find out if your condition is acute or chronic. This may include: Checking your nose for nasal polyps. Viewing your sinuses using a device that has a light (endoscope). Testing for allergies or bacteria. Imaging tests, such as an MRI or CT scan. In rare cases, a bone biopsy may be done to rule out more serious types of fungal sinus disease. How is this treated? Treatment for a sinus infection depends on the cause and whether your condition is chronic or acute. If caused by a virus, your symptoms should go away on their own within 10 days. You may be given medicines to relieve symptoms. They include: Medicines that shrink swollen nasal passages (decongestants). A spray that eases inflammation of the  nostrils (topical intranasal corticosteroids). Rinses that help get rid of thick mucus in your nose (nasal saline  washes). Medicines that treat allergies (antihistamines). Over-the-counter pain relievers. If caused by bacteria, your health care provider may recommend waiting to see if your symptoms improve. Most bacterial infections will get better without antibiotic medicine. You may be given antibiotics if you have: A severe infection. A weak immune system. If caused by narrow nasal passages or nasal polyps, surgery may be needed. Follow these instructions at home: Medicines Take, use, or apply over-the-counter and prescription medicines only as told by your health care provider. These may include nasal sprays. If you were prescribed an antibiotic medicine, take it as told by your health care provider. Do not stop taking the antibiotic even if you start to feel better. Hydrate and humidify  Drink enough fluid to keep your urine pale yellow. Staying hydrated will help to thin your mucus. Use a cool mist humidifier to keep the humidity level in your home above 50%. Inhale steam for 10-15 minutes, 3-4 times a day, or as told by your health care provider. You can do this in the bathroom while a hot shower is running. Limit your exposure to cool or dry air. Rest Rest as much as possible. Sleep with your head raised (elevated). Make sure you get enough sleep each night. General instructions  Apply a warm, moist washcloth to your face 3-4 times a day or as told by your health care provider. This will help with discomfort. Use nasal saline washes as often as told by your health care provider. Wash your hands often with soap and water  to reduce your exposure to germs. If soap and water  are not available, use hand sanitizer. Do not smoke. Avoid being around people who are smoking (secondhand smoke). Keep all follow-up visits. This is important. Contact a health care provider if: You have a fever. Your symptoms get worse. Your symptoms do not improve within 10 days. Get help right away if: You have a  severe headache. You have persistent vomiting. You have severe pain or swelling around your face or eyes. You have vision problems. You develop confusion. Your neck is stiff. You have trouble breathing. These symptoms may be an emergency. Get help right away. Call 911. Do not wait to see if the symptoms will go away. Do not drive yourself to the hospital. Summary A sinus infection is soreness and inflammation of your sinuses. Sinuses are hollow spaces in the bones around your face. This condition is caused by nasal tissues that become inflamed or swollen. The swelling traps or blocks the flow of mucus. This allows bacteria, viruses, and fungi to grow, which leads to infection. If you were prescribed an antibiotic medicine, take it as told by your health care provider. Do not stop taking the antibiotic even if you start to feel better. Keep all follow-up visits. This is important. This information is not intended to replace advice given to you by your health care provider. Make sure you discuss any questions you have with your health care provider. Document Revised: 07/09/2021 Document Reviewed: 07/09/2021 Elsevier Patient Education  2024 [employer].

## 2024-08-23 NOTE — Progress Notes (Signed)
 "  Established Patient Office Visit  Subjective   Patient ID: KAMMIE SCIOLI, female    DOB: 1960-11-09  Age: 64 y.o. MRN: [id number]  Chief Complaint  Patient presents with   Cough    63y/o caucasian female established here for evaluation cough chunky tan mucous foul tasting some sinus pressure right only  Denied teeth hurting/fever/chills.  Has had fatigue.  Last antibiotics augmentin  06/14/24.  Has had many stressors with parents ill and father requiring daily care/picc line infection  I think it has affected my immune system and I don't want my parents to catch anything bad from me either.  Has been using inhaler and cough drops.      Review of Systems  Constitutional:  Positive for malaise/fatigue. Negative for chills, diaphoresis and fever.  HENT:  Positive for congestion and sinus pain. Negative for ear discharge, ear pain, nosebleeds and sore throat.   Eyes:  Negative for discharge and redness.  Respiratory:  Positive for cough, sputum production, shortness of breath and wheezing. Negative for stridor.   Cardiovascular:  Negative for chest pain.  Gastrointestinal:  Negative for diarrhea, nausea and vomiting.  Musculoskeletal:  Positive for myalgias.  Skin:  Negative for rash.  Neurological:  Positive for headaches. Negative for dizziness, seizures, loss of consciousness and weakness.      Objective:     BP (!) 156/86 (BP Location: Left Arm, Patient Position: Sitting, Cuff Size: Large)   Pulse 76   Temp (!) 97.3 F (36.3 C) (Tympanic)   Resp 16   SpO2 98%    Physical Exam Vitals and nursing note reviewed.  Constitutional:      General: She is awake. She is not in acute distress.    Appearance: Normal appearance. She is well-developed and well-groomed. She is not ill-appearing, toxic-appearing or diaphoretic.  HENT:     Head: Normocephalic and atraumatic.     Jaw: There is normal jaw occlusion. No trismus.     Salivary Glands: Right salivary gland is not  diffusely enlarged. Left salivary gland is not diffusely enlarged.     Right Ear: Hearing, ear canal and external ear normal. No decreased hearing noted. No laceration, drainage, swelling or tenderness. A middle ear effusion is present. There is no impacted cerumen. No foreign body. No mastoid tenderness. No PE tube. No hemotympanum. Tympanic membrane is not injected, scarred, perforated, erythematous, retracted or bulging.     Left Ear: Hearing, ear canal and external ear normal. No decreased hearing noted. No laceration, drainage, swelling or tenderness. A middle ear effusion is present. There is no impacted cerumen. No foreign body. No mastoid tenderness. No PE tube. No hemotympanum. Tympanic membrane is not injected, scarred, perforated, erythematous, retracted or bulging.     Nose: Mucosal edema, congestion and rhinorrhea present. No nasal deformity, septal deviation, signs of injury, laceration or nasal tenderness.     Right Nostril: No epistaxis.     Left Nostril: No epistaxis.     Right Sinus: Frontal sinus tenderness present. No maxillary sinus tenderness.     Left Sinus: No maxillary sinus tenderness or frontal sinus tenderness.     Comments: Right frontal lateral sinus TTP; bilateral allergic shiners; cobblestoning posterior pharynx; audible nasal congestion/sniffing in clinic and harsh repetitive cough nonproductive; bilateral TMs intact air fluid level clear no debris in auditory canals; nasal turbinates edema/erythema shotty white discharge yellow    Mouth/Throat:     Lips: Pink. No lesions.     Mouth: Mucous membranes are  moist. Mucous membranes are not pale, not dry and not cyanotic. No lacerations, oral lesions or angioedema.     Dentition: No dental abscesses or gum lesions.     Tongue: No lesions. Tongue does not deviate from midline.     Palate: No mass and lesions.     Pharynx: Uvula midline. Pharyngeal swelling, posterior oropharyngeal erythema and postnasal drip present. No  oropharyngeal exudate or uvula swelling.     Tonsils: No tonsillar exudate or tonsillar abscesses.  Eyes:     General: Lids are normal. Vision grossly intact. Gaze aligned appropriately. Allergic shiner present. No scleral icterus.       Right eye: No foreign body, discharge or hordeolum.        Left eye: No foreign body, discharge or hordeolum.     Extraocular Movements: Extraocular movements intact.     Right eye: Normal extraocular motion and no nystagmus.     Left eye: Normal extraocular motion and no nystagmus.     Conjunctiva/sclera: Conjunctivae normal.     Right eye: Right conjunctiva is not injected. No chemosis, exudate or hemorrhage.    Left eye: Left conjunctiva is not injected. No chemosis, exudate or hemorrhage.    Pupils: Pupils are equal, round, and reactive to light. Pupils are equal.     Right eye: Pupil is round and reactive.     Left eye: Pupil is round and reactive.  Neck:     Thyroid : No thyroid  mass or thyromegaly.     Trachea: Trachea normal. No tracheal tenderness or tracheal deviation.  Cardiovascular:     Rate and Rhythm: Normal rate and regular rhythm.     Pulses: Normal pulses.          Radial pulses are 2+ on the right side and 2+ on the left side.     Heart sounds: Normal heart sounds, S1 normal and S2 normal.  Pulmonary:     Effort: Pulmonary effort is normal. No accessory muscle usage or respiratory distress.     Breath sounds: Normal breath sounds and air entry. No stridor, decreased air movement or transmitted upper airway sounds. No decreased breath sounds, wheezing, rhonchi or rales.     Comments: BBS CTA spo2 stable 98% RA intermittent monitoring spoke full sentences between coughing spells; worst with ambulation or immediately after activity Chest:     Chest wall: No tenderness.  Abdominal:     General: There is no distension.     Palpations: Abdomen is soft.  Musculoskeletal:        General: No tenderness. Normal range of motion.     Right  hand: Normal strength. Normal capillary refill.     Left hand: Normal strength. Normal capillary refill.     Cervical back: Normal range of motion and neck supple. No swelling, edema, deformity, erythema, signs of trauma, lacerations, rigidity, spasms, torticollis, tenderness or crepitus. No pain with movement or muscular tenderness. Normal range of motion.     Thoracic back: No swelling, edema, deformity, signs of trauma, lacerations, spasms or tenderness. Normal range of motion.     Right hip: Normal.     Left hip: Normal.     Right knee: Normal.     Left knee: Normal.  Lymphadenopathy:     Head:     Right side of head: No submental, submandibular, tonsillar, preauricular, posterior auricular or occipital adenopathy.     Left side of head: No submental, submandibular, tonsillar, preauricular, posterior auricular or occipital adenopathy.  Cervical: No cervical adenopathy.     Right cervical: No superficial, deep or posterior cervical adenopathy.    Left cervical: No superficial, deep or posterior cervical adenopathy.  Skin:    General: Skin is warm and dry.     Capillary Refill: Capillary refill takes less than 2 seconds.     Coloration: Skin is not ashen, cyanotic, jaundiced, mottled, pale or sallow.     Findings: No abrasion, abscess, acne, bruising, burn, ecchymosis, erythema, signs of injury, laceration, lesion, petechiae, rash or wound.     Nails: There is no clubbing.  Neurological:     General: No focal deficit present.     Mental Status: She is alert and oriented to person, place, and time. Mental status is at baseline. She is not disoriented.     GCS: GCS eye subscore is 4. GCS verbal subscore is 5. GCS motor subscore is 6.     Cranial Nerves: No cranial nerve deficit, dysarthria or facial asymmetry.     Sensory: Sensation is intact. No sensory deficit.     Motor: Motor function is intact. No weakness, tremor, atrophy, abnormal muscle tone or seizure activity.      Coordination: Coordination is intact. Coordination normal.     Gait: Gait is intact. Gait normal.     Comments: In/out of chair without difficulty; gait sure and steady in clinic; bilateral hand grasp equal 5/5  Psychiatric:        Attention and Perception: Attention and perception normal.        Mood and Affect: Mood and affect normal.        Speech: Speech normal.        Behavior: Behavior normal. Behavior is cooperative.        Thought Content: Thought content normal.        Cognition and Memory: Cognition and memory normal.        Judgment: Judgment normal.      No results found for any visits on 08/23/24.    The 10-year ASCVD risk score (Arnett DK, et al., 2019) is: 15.7%    Assessment & Plan:   Problem List Items Addressed This Visit   None Visit Diagnoses       Acute recurrent frontal sinusitis    -  Primary   Relevant Medications   doxycycline  (VIBRA -TABS) 100 MG tablet   benzonatate  (TESSALON ) 200 MG capsule   promethazine -dextromethorphan (PROMETHAZINE -DM) 6.25-15 MG/5ML syrup     Acute bronchitis, unspecified organism         Rx electronic sent to her pharmacy of choice refill tessalon  pearles 200mg  po TID prn cough #30 RF0 and refilled promethazine  dextromethorphan 6.25-15mg  5ml po at bedtime prn cough discussed will cause drowsiness avoid alcohol intake or driving after taking this medicine.   Doxycycline  100mg  po BID x 10 days #20 RF0 dispensed from [facility]. Discussed to use sunscreen/protective clothing as increased risk of sunburn and most common side effect stomach upset take medication with food. Cough lozenges po q2h prn cough per manufacturer instructions refilled as patient stated running low  Albuterol  MDI 90mcg 1-2 puffs po q4-6h prn protracted cough/wheeze #1 RF0 side effect increased heart rate. Bronchitis simple, community acquired, may have started as viral (probably respiratory syncytial, parainfluenza, influenza, or adenovirus), but now evidence of acute  purulent bronchitis with resultant bronchial edema and mucus formation.  Viruses are the most common cause of bronchial inflammation in otherwise healthy adults with acute bronchitis.  The appearance of sputum is not predictive  of whether a bacterial infection is present.  Purulent sputum is most often caused by viral infections.  There are a small portion of those caused by non-viral agents being Mycoplama pneumonia.  Microscopic examination or C&S of sputum in the healthy adult with acute bronchitis is generally not helpful (usually negative or normal respiratory flora) other considerations being cough from upper respiratory tract infections, sinusitis or allergic syndromes (mild asthma or viral pneumonia).  Differential Diagnoses:  reactive airway disease (asthma, allergic aspergillosis (eosinophilia), chronic bronchitis, respiratory infection (sinusitis, common cold, pneumonia), congestive heart failure, reflux esophagitis, bronchogenic tumor, aspiration syndromes and/or exposure to pulmonary irritants/smoke.  I will order Doxycycline  100mg  two times a day for ten days for possible Mycoplamsa.  Without high fever, severe dyspnea, lack of physical findings or other risk factors, I will hold on a chest radiograph and CBC at this time.  I discussed that approximately 50% of patients with acute bronchitis have a cough that lasts up to three weeks, and 25% for over a month.  Tylenol  500mg  one to two tablets every four to six hours as needed for fever or myalgias.  No aspirin. Exitcare handout on sinusitis/bronchitis and inhaler use given to patient.  ER if hemopthysis, SOB, worst chest pain of life.   Patient instructed to follow up in one week or sooner if symptoms worsen.  Patient verbalized agreement and understanding of treatment plan.  P2:  hand washing and cover cough   Continue flonase  1 spray each nostril BID, saline 2 sprays each nostril q2h wa prn congestion given 1 bottle from clinic stock. start  doxycycline  100mg  po BID x 10 days #20 RF0 dispensed from [facility] to patient  Denied personal or family history of ENT cancer.  Shower BID especially prior to bed. No evidence of systemic bacterial infection, non toxic and well hydrated.  I do not see where any further testing or imaging is necessary at this time.   I will suggest supportive care, rest, good hygiene and encourage the patient to take adequate fluids.  The patient is to return to clinic or EMERGENCY ROOM if symptoms worsen or change significantly.  Exitcare handout on sinusitis and sinus rinse.  Patient verbalized agreement and understanding of treatment plan and had no further questions at this time.   P2:  Hand washing and cover cough  No follow-ups on file.    Ellouise DELENA Hope, NP  "

## 2024-08-26 ENCOUNTER — Ambulatory Visit: Admitting: Family Medicine

## 2024-09-02 ENCOUNTER — Other Ambulatory Visit: Payer: Self-pay | Admitting: Registered Nurse

## 2024-09-02 ENCOUNTER — Encounter: Payer: Self-pay | Admitting: Registered Nurse

## 2024-09-02 DIAGNOSIS — R300 Dysuria: Secondary | ICD-10-CM

## 2024-09-02 MED ORDER — NITROFURANTOIN MONOHYD MACRO 100 MG PO CAPS: 100.0000 mg | ORAL_CAPSULE | Freq: Two times a day (BID) | ORAL | 0 refills | Status: DC

## 2024-09-03 ENCOUNTER — Encounter: Payer: Self-pay | Admitting: Registered Nurse

## 2024-09-03 NOTE — Telephone Encounter (Signed)
 Patient left message requesting refill of nitrofurantoin  as started having pain with urination and urination urge waking her up at night.  She has increased water  intake but not helping symptoms.  Feels like her last UTI.   Not available for appt Tuesday as has to take father to medical appt [facility].  Clinic closed today through Sunday.    Has noticed blood in urine and back pain.  Some stomach pain but not much.  Chills.  Mild headache the past 2 days not a bad one just nagging. Denied cloudy urine or fever.  Last urine culture positive for e coli 50-100K susceptible to nitrofurantoin  per urine culture.  Electronic Rx sent to patient pharmacy of choice nitrofurantoin  100mg  po BID x 7 days #14 RF0  Patient notified to continue increased water  intake so urine pale yellow and voiding every 2-4 hours while awake.  Medications as directed. Patient is also to push fluids and may use OTC Pyridium  200mg  po TID as needed. Hydrate, avoid dehydration. Avoid holding urine void on frequent basis every 4 to 6 hours. If unable to void every 8 hours follow up for re-evaluation with PCM, urgent care or ER. Call or return to clinic as needed if these symptoms worsen or fail to improve as anticipated. Exitcare handouts on dysuria and UTI sent to my chart.  Patient verbalized agreement and understanding of treatment plan and had no further questions at this time.  P2: Hydrate and cranberry juice

## 2024-09-13 NOTE — Telephone Encounter (Signed)
 Patient not at work today due to icy roads 09/13/24

## 2024-09-15 ENCOUNTER — Other Ambulatory Visit: Payer: Self-pay

## 2024-09-15 ENCOUNTER — Ambulatory Visit: Payer: Self-pay | Admitting: Family Medicine

## 2024-09-15 ENCOUNTER — Ambulatory Visit: Admitting: Family Medicine

## 2024-09-15 VITALS — BP 120/72 | HR 76 | Temp 97.9°F | Ht 66.0 in | Wt 264.0 lb

## 2024-09-15 DIAGNOSIS — Z7985 Long-term (current) use of injectable non-insulin antidiabetic drugs: Secondary | ICD-10-CM

## 2024-09-15 DIAGNOSIS — E119 Type 2 diabetes mellitus without complications: Secondary | ICD-10-CM

## 2024-09-15 DIAGNOSIS — Z23 Encounter for immunization: Secondary | ICD-10-CM

## 2024-09-15 DIAGNOSIS — E782 Mixed hyperlipidemia: Secondary | ICD-10-CM | POA: Diagnosis not present

## 2024-09-15 LAB — POCT GLYCOSYLATED HEMOGLOBIN (HGB A1C): Hemoglobin A1C: 5.9 % — AB (ref 4.0–5.6)

## 2024-09-15 MED ORDER — PRAVASTATIN SODIUM 10 MG PO TABS: 10.0000 mg | ORAL_TABLET | Freq: Every day | ORAL | 3 refills | Status: AC

## 2024-09-15 MED ORDER — TRULICITY 1.5 MG/0.5ML ~~LOC~~ SOAJ
1.5000 mg | SUBCUTANEOUS | 12 refills | Status: AC
  Filled 2024-09-15: qty 2, 28d supply, fill #0

## 2024-09-15 MED ORDER — METFORMIN HCL ER 500 MG PO TB24: 500.0000 mg | ORAL_TABLET | Freq: Two times a day (BID) | ORAL | Status: AC

## 2024-09-15 NOTE — Progress Notes (Signed)
 Diabetes:  Using medications without difficulties:yes Hypoglycemic episodes: no Hyperglycemic episodes: no Feet problems: no change in neuropathy at baseline.  Blood Sugars averaging: not checked.    eye exam within last year: yes Taking trulicity  1.5mg  weekly.  Up from 0.75mg  w/o ADE on med.  No GI sx or other issues on med.   D/w pt about possible statin retrial. Statin cautions d/w pt, with use of gemfibrozil .    Prev UTI sx resolved.    A1c 5.9.  d/w pt at  [facility].    D/w pt about GI f/u.  See AVS.    Meds, vitals, and allergies reviewed.   ROS: Per HPI unless specifically indicated in ROS section   GEN: nad, alert and oriented HEENT: ncat NECK: supple w/o LA CV: rrr. PULM: ctab, no inc wob ABD: soft, +bs EXT: trace BLE edema SKIN: well perfused.

## 2024-09-15 NOTE — Patient Instructions (Addendum)
 Please call GI office at [phone] to schedule a colonoscopy.   Take care.  Glad to see you.  Recheck in about 3 months.  A1c at the visit.  If you have trouble with low sugars then let me know.   If you have muscle aches, then stop pravastatin  and let me know.

## 2024-09-19 NOTE — Assessment & Plan Note (Signed)
 If having muscle aches, then stop pravastatin  and let me know.

## 2024-09-23 ENCOUNTER — Ambulatory Visit: Admitting: Cardiovascular Disease

## 2024-09-23 ENCOUNTER — Encounter: Payer: Self-pay | Admitting: Cardiovascular Disease

## 2024-09-23 VITALS — BP 120/80 | HR 79 | Ht 66.0 in | Wt 263.1 lb

## 2024-09-23 DIAGNOSIS — E782 Mixed hyperlipidemia: Secondary | ICD-10-CM

## 2024-09-23 DIAGNOSIS — E119 Type 2 diabetes mellitus without complications: Secondary | ICD-10-CM

## 2024-09-23 DIAGNOSIS — I10 Essential (primary) hypertension: Secondary | ICD-10-CM

## 2024-09-23 NOTE — Patient Instructions (Signed)

## 2024-09-23 NOTE — Progress Notes (Signed)
 Cardiology Office Note  Date:  09/23/2024   ID:  [name], [name] 12/20/1960, MRN [id number]  PCP:  Cleatus Arlyss RAMAN, MD   Chief Complaint  Patient presents with   12 month follow up     Doing well.     HPI:  [name] is a 64 year old woman with history of Morbid obesity Chronic cough, >4 years, bronchospastic Heart catheterization 2002 with Dr. 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 Takes gabapentin  Has a desk job, wears compression hose Walks in the house, no regular exercise program  Lab work reviewed A1C 7.5 down to 5.9  Recently started on pravastatin  10 mg  Total cholesterol 158 LDL 82  Continues taking Lasix  and HCTZ daily Potassium 2 a day  Chronic cough is stable on inhalers  EKG personally reviewed by myself on todays visit EKG Interpretation Date/Time:  Friday September 23 2024 08:34:24 EST Ventricular Rate:  79 PR Interval:  162 QRS Duration:  78 QT Interval:  348 QTC Calculation: 399 R Axis:   1  Text Interpretation: Normal sinus rhythm Normal ECG When compared with ECG of 15-Sep-2023 08:21, No significant change was found Confirmed by Perla Lye [phone]) on 09/23/2024 8:48:04 AM   Other past medical history reviewed Echo 03/2020  1. Left ventricular ejection fraction, by estimation, is 60 to 65%. The  left ventricle has normal function. The left ventricle has no regional  wall motion abnormalities. Left ventricular diastolic parameters are  consistent with Grade II diastolic  dysfunction (pseudonormalization).   2. Right ventricular systolic function is normal. The right ventricular  size is normal. There is normal pulmonary artery systolic  pressure. The  estimated right ventricular systolic pressure is 32.3 mmHg.   Prior cardiac catheterization 2002 normal coronary arteries   PMH:   has a past medical history of Anxiety, Diabetes mellitus without complication (HCC), History of colon polyps, History of eosinophilia, Hyperlipidemia, Hypertension, Hypothyroidism, Migraine, Neuropathy, Nocturia, Pelvic prolapse, Seasonal allergies, Urgency of urination, Vitamin D  deficiency, and Wears glasses.  PSH:    Past Surgical History:  Procedure Laterality Date   ABDOMINAL HYSTERECTOMY  2001   w/  Bladder Suspension   ANTERIOR AND POSTERIOR REPAIR WITH SACROSPINOUS FIXATION N/A 07/24/2014   Procedure: SACROSPINOUS LIGAMENT SUSPENSION ;  Surgeon: LELON Tanda Mulch, MD;  Location: [facility];  Service: Gynecology;  Laterality: N/A;   COLONOSCOPY W/ POLYPECTOMY  04-25-2014   LAPAROSCOPIC CHOLECYSTECTOMY  2008   VAGINAL HYSTERECTOMY N/A 07/24/2014   Procedure: RESECTION OF CERVIX ;  Surgeon: LELON Tanda Mulch, MD;  Location: [facility];  Service: Gynecology;  Laterality: N/A;    Current Outpatient Medications  Medication Sig Dispense Refill   albuterol  (VENTOLIN  HFA) 108 (90 Base) MCG/ACT inhaler Inhale 1-2 puffs into the lungs every 4 (four) hours as needed for wheezing or shortness of breath. 25.5 each 0   ARMOUR THYROID  30 MG tablet TAKE 1 TABLET BY MOUTH EVERY DAY WITH BREAKFAST 90 tablet 3   carvedilol  (COREG ) 6.25 MG tablet TAKE 1 TABLET BY MOUTH TWICE DAILY WITH MEALS 180 tablet 3   Cholecalciferol (VITAMIN D3) 50 MCG (2000 UT) capsule Take 1 capsule (2,000 Units total) by mouth daily.  Chromium-Cinnamon (CINNAMON PLUS CHROMIUM) [phone] MCG-MG CAPS      diclofenac  Sodium (VOLTAREN ) 1 % GEL APPLY 2 GRAMS TOPICALLY FOUR TIMES DAILY AS NEEDED 100 g 2   Dulaglutide  (TRULICITY ) 1.5 MG/0.5ML SOAJ Inject 1.5 mg into the skin once a week. 2 mL 12   fluticasone  (FLONASE ) 50 MCG/ACT nasal spray INSTILL 1 SPRAY IN  EACH NOSTRIL TWICE DAILY 48 g 2   fluticasone -salmeterol (ADVAIR DISKUS) 250-50 MCG/ACT AEPB Inhale 1 puff into the lungs in the morning and at bedtime. 3 each 3   furosemide  (LASIX ) 20 MG tablet TAKE 1 TABLET BY MOUTH EVERY DAY 90 tablet 3   gabapentin  (NEURONTIN ) 100 MG capsule TAKE 1 CAPSULE BY MOUTH TWICE DAILY AS NEEDED 180 capsule 3   gabapentin  (NEURONTIN ) 300 MG capsule TAKE ONE CAPSULE BY MOUTH EVERY DAY AND 2-3 CAPSULES AT NIGHT AS DIRECTED 360 capsule 3   gemfibrozil  (LOPID ) 600 MG tablet Take 1 tablet (600 mg total) by mouth 2 (two) times daily before a meal. 180 tablet 3   Halcinonide  0.1 % CREA APPLY TOPICALLY TO THE AFFECTED AREA TWICE DAILY IF NEEDED 60 g 1   hydrochlorothiazide  (HYDRODIURIL ) 12.5 MG tablet Take 1 tablet (12.5 mg total) by mouth daily. 90 tablet 3   Lutein 20 MG CAPS Take by mouth.     metFORMIN  (GLUCOPHAGE -XR) 500 MG 24 hr tablet Take 1 tablet (500 mg total) by mouth 2 (two) times daily with a meal.     montelukast  (SINGULAIR ) 10 MG tablet TAKE 1 TABLET BY MOUTH AT BEDTIME 90 tablet 3   Multiple Vitamin (MULTIVITAMIN) capsule Take 1 capsule by mouth daily.     omeprazole  (PRILOSEC) 40 MG capsule Take 1 capsule (40 mg total) by mouth daily. 90 capsule 3   Potassium 99 MG TABS Take 1-2 tablets (99-198 mg total) by mouth daily.     pravastatin  (PRAVACHOL ) 10 MG tablet Take 1 tablet (10 mg total) by mouth daily. 90 tablet 3   Turmeric Curcumin 500 MG CAPS daily.      vitamin B-12 (CYANOCOBALAMIN ) 1000 MCG tablet Take 1 tablet (1,000 mcg total) by mouth daily.     No current facility-administered medications for this visit.    Allergies:   Codeine and Metformin  and related   Social History:  The patient  reports that she quit smoking about 42 years ago. Her smoking use included cigarettes. She started smoking about 43 years ago. She has a 1 pack-year smoking history. She has never used smokeless tobacco. She reports that she does not drink alcohol and does not use  drugs.   Family History:   family history includes Breast cancer (age of onset: 71) in her sister; Hyperlipidemia in her mother.    Review of Systems: Review of Systems  Constitutional: Negative.   HENT: Negative.    Respiratory: Negative.    Cardiovascular: Negative.   Gastrointestinal: Negative.   Musculoskeletal: Negative.   Neurological: Negative.   Psychiatric/Behavioral: Negative.    All other systems reviewed and are negative.   PHYSICAL EXAM: VS:  BP 120/80 (BP Location: Left Arm, Patient Position: Sitting, Cuff Size: Large)   Pulse 79   Ht 5' 6 (1.676 m)   Wt 263 lb 2 oz (119.4 kg)   SpO2 97%   BMI 42.47 kg/m  , BMI Body mass index is 42.47 kg/m. Constitutional:  oriented to person, place, and time. No distress.  HENT:  Head: Normocephalic and atraumatic.  Eyes:  no discharge. No scleral icterus.  Neck: Normal range of motion. Neck supple. No JVD present.  Cardiovascular: Normal rate, regular rhythm, normal heart sounds and intact distal pulses. Exam reveals no gallop and no friction rub. No edema No murmur heard. Pulmonary/Chest: Effort normal and breath sounds normal. No stridor. No respiratory distress.  no wheezes.  no rales.  no tenderness.  Abdominal: Soft.  no distension.  no tenderness.  Musculoskeletal: Normal range of motion.  no  tenderness or deformity.  Neurological:  normal muscle tone. Coordination normal. No atrophy Skin: Skin is warm and dry. No rash noted. not diaphoretic.  Psychiatric:  normal mood and affect. behavior is normal. Thought content normal.   Recent Labs: 01/29/2024: ALT 23; BUN 10; Creat 0.73; Hemoglobin 12.6; Platelets 278; Potassium 4.2; Sodium 137; TSH 2.99    Lipid Panel Lab Results  Component Value Date   CHOL 158 05/18/2024   HDL 32.30 (L) 05/18/2024   LDLCALC 82 05/18/2024   TRIG 217.0 (H) 05/18/2024    Wt Readings from Last 3 Encounters:  09/23/24 263 lb 2 oz (119.4 kg)  09/15/24 264 lb (119.7 kg)  05/26/24 277  lb 3.2 oz (125.7 kg)     ASSESSMENT AND PLAN:  Problem List Items Addressed This Visit       Cardiology Problems   Hyperlipidemia   Essential hypertension - Primary   Relevant Orders   EKG 12-Lead (Completed)     Other   Diabetes mellitus without complication (HCC)    Chronic cough Noncardiac in nature, managed by primary care, mild asthma, symptoms stable on inhalers  Shortness of breath Prior workup including echocardiogram with normal ejection fraction Recommend regular walking program for conditioning, low carbohydrate diet  Morbid obesity We have encouraged continued exercise, careful diet management in an effort to lose weight.  Lower extremity edema Stable with Lasix  daily and HCTZ daily On potassium 2 a day -Compression hose on days when she has long hours at the desk for work  Hypertension Blood pressure is well controlled on today's visit. No changes made to the medications.  Diabetes type 2 Lower extremity neuropathy Consider GLP-1  On gabapentin  A1c improving on Trulicity  down to 5.9   Signed, Velinda Lunger, M.D., Ph.D. [facility] [place], [place] [phone]

## 2024-12-13 ENCOUNTER — Ambulatory Visit: Admitting: Family Medicine
